# Patient Record
Sex: Male | Born: 1985 | Race: White | Hispanic: No | Marital: Single | Smoking: Never smoker
Health system: Southern US, Community
[De-identification: ages and names within clinical notes are randomized; demographics above are authoritative.]

## PROBLEM LIST (undated history)

## (undated) DIAGNOSIS — E119 Type 2 diabetes mellitus without complications: Secondary | ICD-10-CM

## (undated) DIAGNOSIS — I10 Essential (primary) hypertension: Secondary | ICD-10-CM

## (undated) HISTORY — PX: EYE SURGERY: SHX253

---
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
## 2014-08-18 ENCOUNTER — Encounter (HOSPITAL_BASED_OUTPATIENT_CLINIC_OR_DEPARTMENT_OTHER): Payer: Self-pay

## 2014-08-18 ENCOUNTER — Emergency Department (HOSPITAL_BASED_OUTPATIENT_CLINIC_OR_DEPARTMENT_OTHER): Payer: Worker's Compensation

## 2014-08-18 ENCOUNTER — Emergency Department (HOSPITAL_BASED_OUTPATIENT_CLINIC_OR_DEPARTMENT_OTHER)
Admission: EM | Admit: 2014-08-18 | Discharge: 2014-08-18 | Disposition: A | Discharge status: Home / Self Care | Payer: Worker's Compensation | Attending: Emergency Medicine | Admitting: Emergency Medicine

## 2014-08-18 DIAGNOSIS — Y9289 Other specified places as the place of occurrence of the external cause: Secondary | ICD-10-CM | POA: Insufficient documentation

## 2014-08-18 DIAGNOSIS — S99922A Unspecified injury of left foot, initial encounter: Secondary | ICD-10-CM | POA: Diagnosis present

## 2014-08-18 DIAGNOSIS — W228XXA Striking against or struck by other objects, initial encounter: Secondary | ICD-10-CM | POA: Insufficient documentation

## 2014-08-18 DIAGNOSIS — Z794 Long term (current) use of insulin: Secondary | ICD-10-CM | POA: Diagnosis not present

## 2014-08-18 DIAGNOSIS — I10 Essential (primary) hypertension: Secondary | ICD-10-CM | POA: Insufficient documentation

## 2014-08-18 DIAGNOSIS — IMO0002 Reserved for concepts with insufficient information to code with codable children: Secondary | ICD-10-CM

## 2014-08-18 DIAGNOSIS — Y9389 Activity, other specified: Secondary | ICD-10-CM | POA: Insufficient documentation

## 2014-08-18 DIAGNOSIS — Z23 Encounter for immunization: Secondary | ICD-10-CM | POA: Insufficient documentation

## 2014-08-18 DIAGNOSIS — E119 Type 2 diabetes mellitus without complications: Secondary | ICD-10-CM | POA: Diagnosis not present

## 2014-08-18 DIAGNOSIS — S91112A Laceration without foreign body of left great toe without damage to nail, initial encounter: Secondary | ICD-10-CM | POA: Diagnosis not present

## 2014-08-18 DIAGNOSIS — S92424A Nondisplaced fracture of distal phalanx of right great toe, initial encounter for closed fracture: Secondary | ICD-10-CM | POA: Insufficient documentation

## 2014-08-18 DIAGNOSIS — S92912B Unspecified fracture of left toe(s), initial encounter for open fracture: Secondary | ICD-10-CM

## 2014-08-18 DIAGNOSIS — Y998 Other external cause status: Secondary | ICD-10-CM | POA: Insufficient documentation

## 2014-08-18 HISTORY — DX: Essential (primary) hypertension: I10

## 2014-08-18 HISTORY — DX: Type 2 diabetes mellitus without complications: E11.9

## 2014-08-18 MED ORDER — SULFAMETHOXAZOLE-TRIMETHOPRIM 800-160 MG PO TABS: 1.0000 | ORAL_TABLET | Freq: Two times a day (BID) | ORAL | Status: AC

## 2014-08-18 MED ORDER — HYDROCODONE-ACETAMINOPHEN 5-325 MG PO TABS
1.0000 | ORAL_TABLET | ORAL | Status: DC | PRN
Start: 2014-08-18 — End: 2015-05-26

## 2014-08-18 MED ORDER — OXYCODONE-ACETAMINOPHEN 5-325 MG PO TABS
1.0000 | ORAL_TABLET | Freq: Once | ORAL | Status: AC
  Administered 2014-08-18: 1 via ORAL
  Filled 2014-08-18: qty 1

## 2014-08-18 MED ORDER — NAPROXEN 500 MG PO TABS: 500.0000 mg | ORAL_TABLET | Freq: Two times a day (BID) | ORAL | Status: DC

## 2014-08-18 MED ORDER — TETANUS-DIPHTH-ACELL PERTUSSIS 5-2.5-18.5 LF-MCG/0.5 IM SUSP
0.5000 mL | Freq: Once | INTRAMUSCULAR | Status: AC
  Administered 2014-08-18: 0.5 mL via INTRAMUSCULAR
  Filled 2014-08-18: qty 0.5

## 2014-08-18 MED ORDER — LIDOCAINE HCL (PF) 1 % IJ SOLN
5.0000 mL | Freq: Once | INTRAMUSCULAR | Status: AC
  Administered 2014-08-18: 5 mL via INTRADERMAL
  Filled 2014-08-18: qty 5

## 2014-08-18 MED ORDER — CEPHALEXIN 500 MG PO CAPS: 500.0000 mg | ORAL_CAPSULE | Freq: Four times a day (QID) | ORAL | Status: DC

## 2014-08-18 NOTE — ED Notes (Signed)
Dropped 120lb stone on left great toe today at [place] noted with nail nearly avulsed nail

## 2014-08-18 NOTE — ED Notes (Signed)
Pt was advised to contact supervisor for possible need for WC UDS

## 2014-08-18 NOTE — Discharge Instructions (Signed)
Please follow directions provided. Sure to follow-up with orthopedic doctor if your pain does not improve her symptoms worsen. Be sure to keep your wound clean and dry. Use warm soapy water soaks daily and change her dressing daily. Please take both antibiotics as directed to help prevent infection. You may take the naproxen twice a day to help with pain and inflammation. You may take the Vicodin as needed for pain not relieved by the naproxen. May use ice 20 minutes on 20 minutes off to help with discomfort. Don't hesitate to return for any new, worsening, or concerning symptoms.  GET HELP RIGHT AWAY IF:  Yellowish-white fluid (pus) comes from the wound.  Medicine does not lessen your pain.  There is a red streak going away from the wound.  You have a fever.

## 2014-08-18 NOTE — ED Provider Notes (Signed)
CSN: [id number]     Arrival date & time 08/18/14  1826 History   First MD Initiated Contact with Patient 08/18/14 2021     Chief Complaint  Patient presents with  . Toe Injury   (Consider location/radiation/quality/duration/timing/severity/associated sxs/prior Treatment) HPI  Barry Pittman is a 29 yo male presenting with report of toe injury.  He states appr 2 hours prior to arrival he was carrying landscaping stones and dropped on on his left great toe.  He estimates the stone weighs 120 pounds.  He rates his pain currently as 5/10.  He denies any numbness or tingling.  Past Medical History  Diagnosis Date  . Diabetes mellitus without complication   . Hypertension    History reviewed. No pertinent past surgical history. No family history on file. History  Substance Use Topics  . Smoking status: Never Smoker   . Smokeless tobacco: Not on file  . Alcohol Use: Yes    Review of Systems  Constitutional: Negative for fever and chills.  HENT: Negative for sore throat.   Eyes: Negative for visual disturbance.  Respiratory: Negative for cough and shortness of breath.   Cardiovascular: Negative for chest pain and leg swelling.  Gastrointestinal: Negative for nausea, vomiting and diarrhea.  Genitourinary: Negative for dysuria.  Musculoskeletal: Positive for myalgias and arthralgias.  Skin: Positive for color change and wound. Negative for rash.  Neurological: Negative for weakness, numbness and headaches.      Allergies  Review of patient's allergies indicates no known allergies.  Home Medications   Prior to Admission medications   Medication Sig Start Date End Date Taking? Authorizing Provider  Insulin Lispro, Human, (HUMALOG [place]) Inject into the skin.   Yes Historical Provider, MD  lisinopril (PRINIVIL,ZESTRIL) 20 MG tablet Take 20 mg by mouth daily.   Yes Historical Provider, MD   BP 169/100 mmHg  Pulse 102  Temp(Src) 98.1 F (36.7 C) (Oral)  Resp 18  Ht 5\' 11"   (1.803 m)  Wt 237 lb (107.502 kg)  BMI 33.07 kg/m2  SpO2 100% Physical Exam  Constitutional: He appears well-developed and well-nourished. No distress.  HENT:  Head: Normocephalic and atraumatic.  Mouth/Throat: Oropharynx is clear and moist.  Eyes: Conjunctivae are normal.  Neck: Neck supple.  Cardiovascular: Normal rate, regular rhythm and intact distal pulses.   Pulmonary/Chest: Effort normal and breath sounds normal. No respiratory distress. He has no wheezes. He has no rales. He exhibits no tenderness.  Abdominal: Soft. There is no tenderness.  Musculoskeletal: He exhibits tenderness.       Feet:  Bruising and tenderness to left great toe, partially avulsed nail, bleeding controlled, ROM reduced due to pain, N/V intact.   Lymphadenopathy:    He has no cervical adenopathy.  Neurological: He is alert.  Skin: Skin is warm and dry. No rash noted. He is not diaphoretic.  Psychiatric: He has a normal mood and affect.  Nursing note and vitals reviewed.   ED Course  Procedures (including critical care time) NERVE BLOCK Performed by: Britt Bottom Consent: Verbal consent obtained. Required items: required blood products, implants, devices, and special equipment available Time out: Immediately prior to procedure a "time out" was called to verify the correct patient, procedure, equipment, support staff and site/side marked as required.  Indication: nail removal Nerve block body site: left great toe  Preparation: Patient was prepped and draped in the usual sterile fashion. Needle gauge: 43 G Location technique: anatomical landmarks  Local anesthetic: 1 %Lidocaine without epi  Anesthetic  total: 5 ml  Outcome: pain improved Patient tolerance: Patient tolerated the procedure well with no immediate complications.   Labs Review Labs Reviewed - No data to display  Imaging Review Dg Toe Great Left  08/18/2014   CLINICAL DATA:  29 year old male with injury to the left great  toe today at work after being struck by a heavy stone.  EXAM: LEFT GREAT TOE  COMPARISON:  No priors.  FINDINGS: There appears to be a very subtle nondisplaced fracture of the distal phalanx of the great toe. No other acute displaced fracture, subluxation or dislocation is noted. Overlying soft tissues appear swollen.  IMPRESSION: 1. Subtle nondisplaced fracture of the distal phalanx of the left great toe.   Electronically Signed   By: Vinnie Langton M.D.   On: 08/18/2014 19:40     EKG Interpretation None      MDM   Final diagnoses:  Toe fracture, left, open, initial encounter  Nail avulsion   29 yo with nondisplaced left great toe fracture on xray and partial avulsed nail after blunt injury. Tdap given. His pain managed in ED. Digital block performed, nail removed and wound cleaned with soaking and pressure irrigation. Discussed wound care at home and use of post-op shoe.  Pt advised to follow up with orthopedics if symptoms persist. Conservative therapy recommended and discussed. Pt is well-appearing, in no acute distress and vital signs are stable.  They appear safe to be discharged.  Discharge include follow-up with their PCP.  Return precautions provided.  Patient will be dc home & is agreeable with above plan.   Filed Vitals:   08/18/14 1831 08/18/14 2158  BP: 169/100 145/76  Pulse: 102 87  Temp: 98.1 F (36.7 C)   TempSrc: Oral   Resp: 18 18  Height: 5\' 11"  (1.803 m)   Weight: 237 lb (107.502 kg)   SpO2: 100% 99%   Meds given in ED:  Medications  lidocaine (PF) (XYLOCAINE) 1 % injection 5 mL (5 mLs Intradermal Given 08/18/14 2046)  oxyCODONE-acetaminophen (PERCOCET/ROXICET) 5-325 MG per tablet 1 tablet (1 tablet Oral Given 08/18/14 2043)  Tdap (BOOSTRIX) injection 0.5 mL (0.5 mLs Intramuscular Given 08/18/14 2043)    Discharge Medication List as of 08/18/2014  9:50 PM    START taking these medications   Details  cephALEXin (KEFLEX) 500 MG capsule Take 1 capsule (500 mg  total) by mouth 4 (four) times daily., Starting 08/18/2014, Until Discontinued, Print    HYDROcodone-acetaminophen (NORCO/VICODIN) 5-325 MG per tablet Take 1 tablet by mouth every 4 (four) hours as needed., Starting 08/18/2014, Until Discontinued, Print    naproxen (NAPROSYN) 500 MG tablet Take 1 tablet (500 mg total) by mouth 2 (two) times daily., Starting 08/18/2014, Until Discontinued, Print    sulfamethoxazole-trimethoprim (BACTRIM DS,SEPTRA DS) 800-160 MG per tablet Take 1 tablet by mouth 2 (two) times daily., Starting 08/18/2014, Until Mon 08/25/14, Print           Britt Bottom, NP 08/19/14 1512  Malvin Johns, MD 08/19/14 [phone]

## 2015-05-26 ENCOUNTER — Inpatient Hospital Stay (HOSPITAL_COMMUNITY)
Admission: EM | Admit: 2015-05-26 | Discharge: 2015-05-31 | DRG: 637 | Disposition: A | Discharge status: Home / Self Care | Payer: 59 | Attending: Internal Medicine | Admitting: Internal Medicine

## 2015-05-26 ENCOUNTER — Encounter (HOSPITAL_COMMUNITY): Payer: Self-pay | Admitting: Emergency Medicine

## 2015-05-26 ENCOUNTER — Emergency Department (HOSPITAL_COMMUNITY): Payer: 59

## 2015-05-26 ENCOUNTER — Inpatient Hospital Stay (HOSPITAL_COMMUNITY): Payer: 59

## 2015-05-26 DIAGNOSIS — J9601 Acute respiratory failure with hypoxia: Secondary | ICD-10-CM | POA: Diagnosis not present

## 2015-05-26 DIAGNOSIS — D7589 Other specified diseases of blood and blood-forming organs: Secondary | ICD-10-CM | POA: Diagnosis present

## 2015-05-26 DIAGNOSIS — E1110 Type 2 diabetes mellitus with ketoacidosis without coma: Secondary | ICD-10-CM | POA: Diagnosis present

## 2015-05-26 DIAGNOSIS — I82409 Acute embolism and thrombosis of unspecified deep veins of unspecified lower extremity: Secondary | ICD-10-CM

## 2015-05-26 DIAGNOSIS — N179 Acute kidney failure, unspecified: Secondary | ICD-10-CM | POA: Diagnosis present

## 2015-05-26 DIAGNOSIS — M7989 Other specified soft tissue disorders: Secondary | ICD-10-CM | POA: Diagnosis not present

## 2015-05-26 DIAGNOSIS — I10 Essential (primary) hypertension: Secondary | ICD-10-CM | POA: Diagnosis present

## 2015-05-26 DIAGNOSIS — D75839 Thrombocytosis, unspecified: Secondary | ICD-10-CM | POA: Diagnosis present

## 2015-05-26 DIAGNOSIS — E1010 Type 1 diabetes mellitus with ketoacidosis without coma: Principal | ICD-10-CM

## 2015-05-26 DIAGNOSIS — E871 Hypo-osmolality and hyponatremia: Secondary | ICD-10-CM | POA: Diagnosis present

## 2015-05-26 DIAGNOSIS — Z9641 Presence of insulin pump (external) (internal): Secondary | ICD-10-CM | POA: Diagnosis present

## 2015-05-26 DIAGNOSIS — G43A1 Cyclical vomiting, intractable: Secondary | ICD-10-CM | POA: Diagnosis not present

## 2015-05-26 DIAGNOSIS — L03116 Cellulitis of left lower limb: Secondary | ICD-10-CM | POA: Diagnosis present

## 2015-05-26 DIAGNOSIS — M7120 Synovial cyst of popliteal space [Baker], unspecified knee: Secondary | ICD-10-CM

## 2015-05-26 DIAGNOSIS — E875 Hyperkalemia: Secondary | ICD-10-CM | POA: Diagnosis present

## 2015-05-26 DIAGNOSIS — Z794 Long term (current) use of insulin: Secondary | ICD-10-CM

## 2015-05-26 DIAGNOSIS — R079 Chest pain, unspecified: Secondary | ICD-10-CM

## 2015-05-26 DIAGNOSIS — E860 Dehydration: Secondary | ICD-10-CM | POA: Diagnosis present

## 2015-05-26 DIAGNOSIS — R112 Nausea with vomiting, unspecified: Secondary | ICD-10-CM | POA: Diagnosis present

## 2015-05-26 DIAGNOSIS — E0810 Diabetes mellitus due to underlying condition with ketoacidosis without coma: Secondary | ICD-10-CM | POA: Diagnosis not present

## 2015-05-26 DIAGNOSIS — D473 Essential (hemorrhagic) thrombocythemia: Secondary | ICD-10-CM | POA: Diagnosis present

## 2015-05-26 DIAGNOSIS — D72829 Elevated white blood cell count, unspecified: Secondary | ICD-10-CM | POA: Diagnosis present

## 2015-05-26 DIAGNOSIS — R1110 Vomiting, unspecified: Secondary | ICD-10-CM

## 2015-05-26 DIAGNOSIS — L0291 Cutaneous abscess, unspecified: Secondary | ICD-10-CM

## 2015-05-26 LAB — CBC WITH DIFFERENTIAL/PLATELET
BASOS ABS: 0 10*3/uL (ref 0.0–0.1)
Basophils Relative: 0 %
Eosinophils Absolute: 0 10*3/uL (ref 0.0–0.7)
Eosinophils Relative: 0 %
HEMATOCRIT: 54.2 % — AB (ref 39.0–52.0)
HEMOGLOBIN: 15.9 g/dL (ref 13.0–17.0)
Lymphocytes Relative: 10 %
Lymphs Abs: 2.8 10*3/uL (ref 0.7–4.0)
MCH: 28.6 pg (ref 26.0–34.0)
MCHC: 29.3 g/dL — AB (ref 30.0–36.0)
MCV: 97.5 fL (ref 78.0–100.0)
MONOS PCT: 8 %
Monocytes Absolute: 2.2 10*3/uL — ABNORMAL HIGH (ref 0.1–1.0)
NEUTROS ABS: 23 10*3/uL — AB (ref 1.7–7.7)
Neutrophils Relative %: 82 %
Platelets: 472 10*3/uL — ABNORMAL HIGH (ref 150–400)
RBC: 5.56 MIL/uL (ref 4.22–5.81)
RDW: 12.7 % (ref 11.5–15.5)
WBC: 28 10*3/uL — ABNORMAL HIGH (ref 4.0–10.5)

## 2015-05-26 LAB — URINE MICROSCOPIC-ADD ON

## 2015-05-26 LAB — ABO/RH: ABO/RH(D): O POS

## 2015-05-26 LAB — TYPE AND SCREEN
ABO/RH(D): O POS
Antibody Screen: NEGATIVE

## 2015-05-26 LAB — COMPREHENSIVE METABOLIC PANEL
ALBUMIN: 4.8 g/dL (ref 3.5–5.0)
ALT: 16 U/L — ABNORMAL LOW (ref 17–63)
AST: 26 U/L (ref 15–41)
Alkaline Phosphatase: 121 U/L (ref 38–126)
BUN: 38 mg/dL — ABNORMAL HIGH (ref 6–20)
CHLORIDE: 86 mmol/L — AB (ref 101–111)
Calcium: 9 mg/dL (ref 8.9–10.3)
Creatinine, Ser: 2.24 mg/dL — ABNORMAL HIGH (ref 0.61–1.24)
GFR calc Af Amer: 44 mL/min — ABNORMAL LOW (ref >60)
GFR, EST NON AFRICAN AMERICAN: 38 mL/min — AB (ref >60)
Glucose, Bld: 1149 mg/dL (ref 65–99)
POTASSIUM: 5.9 mmol/L — AB (ref 3.5–5.1)
Sodium: 123 mmol/L — ABNORMAL LOW (ref 135–145)
Total Bilirubin: 1.6 mg/dL — ABNORMAL HIGH (ref 0.3–1.2)
Total Protein: 8.4 g/dL — ABNORMAL HIGH (ref 6.5–8.1)

## 2015-05-26 LAB — BASIC METABOLIC PANEL
ANION GAP: 19 — AB (ref 5–15)
Anion gap: 15 (ref 5–15)
BUN: 37 mg/dL — AB (ref 6–20)
BUN: 33 mg/dL — ABNORMAL HIGH (ref 6–20)
BUN: 27 mg/dL — AB (ref 6–20)
CALCIUM: 8.6 mg/dL — AB (ref 8.9–10.3)
CHLORIDE: 104 mmol/L (ref 101–111)
CHLORIDE: 109 mmol/L (ref 101–111)
CO2: <7 mmol/L — ABNORMAL LOW (ref 22–32)
CO2: 7 mmol/L — ABNORMAL LOW (ref 22–32)
CO2: 13 mmol/L — ABNORMAL LOW (ref 22–32)
CREATININE: 1.85 mg/dL — AB (ref 0.61–1.24)
Calcium: 8.9 mg/dL (ref 8.9–10.3)
Calcium: 8.8 mg/dL — ABNORMAL LOW (ref 8.9–10.3)
Chloride: 108 mmol/L (ref 101–111)
Creatinine, Ser: 1.74 mg/dL — ABNORMAL HIGH (ref 0.61–1.24)
Creatinine, Ser: 1.44 mg/dL — ABNORMAL HIGH (ref 0.61–1.24)
GFR calc Af Amer: 55 mL/min — ABNORMAL LOW (ref >60)
GFR calc Af Amer: >60 mL/min (ref >60)
GFR calc non Af Amer: >60 mL/min (ref >60)
GFR, EST AFRICAN AMERICAN: 59 mL/min — AB (ref >60)
GFR, EST NON AFRICAN AMERICAN: 47 mL/min — AB (ref >60)
GFR, EST NON AFRICAN AMERICAN: 51 mL/min — AB (ref >60)
Glucose, Bld: 597 mg/dL (ref 65–99)
Glucose, Bld: 365 mg/dL — ABNORMAL HIGH (ref 65–99)
Glucose, Bld: 188 mg/dL — ABNORMAL HIGH (ref 65–99)
POTASSIUM: 5.8 mmol/L — AB (ref 3.5–5.1)
Potassium: 6.9 mmol/L (ref 3.5–5.1)
Potassium: 4.9 mmol/L (ref 3.5–5.1)
SODIUM: 134 mmol/L — AB (ref 135–145)
SODIUM: 137 mmol/L (ref 135–145)
Sodium: 134 mmol/L — ABNORMAL LOW (ref 135–145)

## 2015-05-26 LAB — BLOOD GAS, VENOUS
Acid-base deficit: 30.5 mmol/L — ABNORMAL HIGH (ref 0.0–2.0)
Bicarbonate: 5.3 mEq/L — ABNORMAL LOW (ref 20.0–24.0)
O2 SAT: 46.1 %
PATIENT TEMPERATURE: 98.6
PH VEN: 6.887 — AB (ref 7.250–7.300)
TCO2: 6.2 mmol/L (ref 0–100)
pCO2, Ven: 29.3 mmHg — ABNORMAL LOW (ref 45.0–50.0)

## 2015-05-26 LAB — GLUCOSE, CAPILLARY
Glucose-Capillary: 538 mg/dL — ABNORMAL HIGH (ref 65–99)
Glucose-Capillary: 456 mg/dL — ABNORMAL HIGH (ref 65–99)

## 2015-05-26 LAB — CBG MONITORING, ED
Glucose-Capillary: >600 mg/dL (ref 65–99)
Glucose-Capillary: >600 mg/dL (ref 65–99)
Glucose-Capillary: 557 mg/dL (ref 65–99)

## 2015-05-26 LAB — RAPID URINE DRUG SCREEN, HOSP PERFORMED
AMPHETAMINES: NOT DETECTED
BENZODIAZEPINES: NOT DETECTED
Barbiturates: NOT DETECTED
COCAINE: NOT DETECTED
OPIATES: NOT DETECTED
TETRAHYDROCANNABINOL: NOT DETECTED

## 2015-05-26 LAB — URINALYSIS, ROUTINE W REFLEX MICROSCOPIC
BILIRUBIN URINE: NEGATIVE
Glucose, UA: >1000 mg/dL — AB
Ketones, ur: >80 mg/dL — AB
Leukocytes, UA: NEGATIVE
NITRITE: NEGATIVE
PH: 5 (ref 5.0–8.0)
Protein, ur: 30 mg/dL — AB
Specific Gravity, Urine: 1.027 (ref 1.005–1.030)

## 2015-05-26 LAB — CK: CK TOTAL: 127 U/L (ref 49–397)

## 2015-05-26 LAB — LIPASE, BLOOD: Lipase: 16 U/L (ref 11–51)

## 2015-05-26 MED ORDER — SODIUM POLYSTYRENE SULFONATE 15 GM/60ML PO SUSP: 15.0000 g | Freq: Once | ORAL | Status: DC

## 2015-05-26 MED ORDER — PANTOPRAZOLE SODIUM 40 MG PO TBEC
40.0000 mg | DELAYED_RELEASE_TABLET | Freq: Every day | ORAL | Status: DC
  Administered 2015-05-27 – 2015-05-31 (×5): 40 mg via ORAL
  Filled 2015-05-26 (×5): qty 1

## 2015-05-26 MED ORDER — SODIUM CHLORIDE 0.9 % IV SOLN
INTRAVENOUS | Status: DC
  Administered 2015-05-26 – 2015-05-27 (×2): via INTRAVENOUS
  Administered 2015-05-29: 4.1 [IU]/h via INTRAVENOUS
  Filled 2015-05-26 (×3): qty 2.5

## 2015-05-26 MED ORDER — MORPHINE SULFATE (PF) 2 MG/ML IV SOLN
1.0000 mg | INTRAVENOUS | Status: DC | PRN
  Administered 2015-05-26 (×2): 1 mg via INTRAVENOUS
  Filled 2015-05-26 (×2): qty 1

## 2015-05-26 MED ORDER — DEXTROSE-NACL 5-0.45 % IV SOLN
INTRAVENOUS | Status: DC
  Administered 2015-05-26 – 2015-05-28 (×4): via INTRAVENOUS

## 2015-05-26 MED ORDER — SODIUM CHLORIDE 0.9 % IV SOLN
1000.0000 mL | Freq: Once | INTRAVENOUS | Status: AC
  Administered 2015-05-26: 1000 mL via INTRAVENOUS

## 2015-05-26 MED ORDER — MORPHINE SULFATE (PF) 2 MG/ML IV SOLN
2.0000 mg | INTRAVENOUS | Status: DC | PRN
  Administered 2015-05-26 – 2015-05-27 (×5): 2 mg via INTRAVENOUS
  Filled 2015-05-26 (×5): qty 1

## 2015-05-26 MED ORDER — SODIUM CHLORIDE 0.9 % IV SOLN
1000.0000 mL | INTRAVENOUS | Status: DC
  Administered 2015-05-26: 1000 mL via INTRAVENOUS

## 2015-05-26 MED ORDER — DEXTROSE-NACL 5-0.45 % IV SOLN: INTRAVENOUS | Status: DC

## 2015-05-26 MED ORDER — HEPARIN SODIUM (PORCINE) 5000 UNIT/ML IJ SOLN
5000.0000 [IU] | Freq: Three times a day (TID) | INTRAMUSCULAR | Status: DC
  Administered 2015-05-26 – 2015-05-31 (×14): 5000 [IU] via SUBCUTANEOUS
  Filled 2015-05-26 (×19): qty 1

## 2015-05-26 MED ORDER — SODIUM CHLORIDE 0.9 % IV SOLN
INTRAVENOUS | Status: DC
  Administered 2015-05-26: 14:00:00 via INTRAVENOUS

## 2015-05-26 MED ORDER — PANTOPRAZOLE SODIUM 40 MG IV SOLR
40.0000 mg | Freq: Two times a day (BID) | INTRAVENOUS | Status: AC
  Administered 2015-05-26: 40 mg via INTRAVENOUS
  Filled 2015-05-26: qty 40

## 2015-05-26 MED ORDER — SODIUM CHLORIDE 0.9 % IV BOLUS (SEPSIS)
1000.0000 mL | Freq: Once | INTRAVENOUS | Status: AC
  Administered 2015-05-26: 1000 mL via INTRAVENOUS

## 2015-05-26 MED ORDER — PROMETHAZINE HCL 25 MG/ML IJ SOLN
12.5000 mg | INTRAMUSCULAR | Status: DC | PRN
  Administered 2015-05-26: 12.5 mg via INTRAVENOUS
  Administered 2015-05-27 (×2): 25 mg via INTRAVENOUS
  Administered 2015-05-27: 12.5 mg via INTRAVENOUS
  Filled 2015-05-26 (×4): qty 1

## 2015-05-26 MED ORDER — SODIUM CHLORIDE 0.9 % IV SOLN
INTRAVENOUS | Status: AC
  Administered 2015-05-26: 13:00:00 via INTRAVENOUS

## 2015-05-26 MED ORDER — SODIUM CHLORIDE 0.9 % IV SOLN
INTRAVENOUS | Status: DC
  Administered 2015-05-26: 5.4 [IU]/h via INTRAVENOUS
  Administered 2015-05-26: 10.8 [IU]/h via INTRAVENOUS
  Filled 2015-05-26: qty 2.5

## 2015-05-26 MED ORDER — ONDANSETRON HCL 4 MG/2ML IJ SOLN
4.0000 mg | Freq: Four times a day (QID) | INTRAMUSCULAR | Status: DC | PRN
  Administered 2015-05-26 – 2015-05-28 (×4): 4 mg via INTRAVENOUS
  Filled 2015-05-26 (×4): qty 2

## 2015-05-26 NOTE — Progress Notes (Signed)
[facility] Progress Note Patient Name: DEEN KEHLER DOB: March 11, 1986 MRN: [id number]   Date of Service  05/26/2015  HPI/Events of Note  K 5.9-->6.9-->5.8 Nurse also noted Left lower leg swelling  eICU Interventions  1.continue insulin drip 2.obtain US lower ext-assess for DVT, abscess, cyst        Lauria Depoy 05/26/2015, 6:09 PM

## 2015-05-26 NOTE — ED Notes (Signed)
Primary nurse made aware of critical glucose of 1149.

## 2015-05-26 NOTE — H&P (Signed)
PULMONARY / CRITICAL CARE MEDICINE   Name: Barry Pittman MRN: [id number] DOB: [birth year]    ADMISSION DATE:  05/26/2015 CONSULTATION DATE:  05/26/2015  REFERRING MD:  EDP  CHIEF COMPLAINT:  DKA   HISTORY OF PRESENT ILLNESS:   Barry Pittman is a 30 y/o male with PMH of Type I DM (on insulin pump, average daily use ~ 80 units + coverage) and HTN who presented to [facility] with complaints of weakness and nausea/vomiting x 3 days.   Patient states that he began not feeling well on Saturday and then began having abdominal pain and vomiting on Sunday which has continued through day of admission. Patient reports episodes of vomiting and severe wretching overnight on Monday with dark "red" material coming up. He denies associated diarrhea or fevers and has not had any sick contacts recently. He also reports that his blood sugars have been elevated and getting progressively higher since Sunday despite reduced PO intake and continuing his normal insulin regimen per his pump.   Upon arrival to the ED he was tachycardic into the 130s, tachypneic and hypertensive into the 160s. His initial glucose was 1149, VBG with pH 6.887, pCO2 29.3 and bicarbonate 5.3, WBC 28.0, NA 123, K 5.9, and creatinine 2.24. UA with > 80 ketones and glucose > 1000. CXR with enlargement of cardiac silhouette but no acute infiltrate. Patient was started on DKA protocol and PCCM was consulted for admission.   PAST MEDICAL HISTORY :  He  has a past medical history of Diabetes mellitus without complication ([place]) and Hypertension.  PAST SURGICAL HISTORY: He  has no past surgical history on file.  No Known Allergies  No current facility-administered medications on file prior to encounter.   Current Outpatient Prescriptions on File Prior to Encounter  Medication Sig  . lisinopril (PRINIVIL,ZESTRIL) 20 MG tablet Take 20 mg by mouth daily.    FAMILY HISTORY:  His has no family status information on file.   SOCIAL HISTORY: He   reports that he has never smoked. He does not have any smokeless tobacco history on file. He reports that he drinks alcohol. He reports that he does not use illicit drugs.  REVIEW OF SYSTEMS:   Gen: Denies fever, chills, weight change, fatigue, night sweats HEENT: Denies blurred vision, double vision, hearing loss, tinnitus, sinus congestion, rhinorrhea, sore throat, neck stiffness, dysphagia PULM: Denies shortness of breath, cough, sputum production, hemoptysis, wheezing CV: Denies edema, orthopnea, paroxysmal nocturnal dyspnea, palpitations. Reports chest pain.  GI: Denies diarrhea, melena, constipation, change in bowel habits. Reports nausea, vomiting, abdominal pain, blood in vomit.  GU: Denies dysuria, hematuria, polyuria, oliguria, urethral discharge Endocrine: Denies hot or cold intolerance, polyuria, polyphagia or appetite change Derm: Denies rash, dry skin, scaling or peeling skin change Heme: Denies easy bruising, bleeding, bleeding gums Neuro: Denies headache, numbness, weakness, slurred speech, loss of memory or consciousness   SUBJECTIVE:  Reports chest pain and some abdominal pain   VITAL SIGNS: BP 161/73 mmHg  Pulse 113  Resp 34  SpO2 100%     INTAKE / OUTPUT:    PHYSICAL EXAMINATION: General:  Obese white male, lying in bed, in mild distress  Neuro:  A&Ox4, speech clear, non-focal.  HEENT: PERRL. Mucous membranes dry.  Cardiovascular:  Tachycardic, regular, no murmurs/rubs/gallops  Lungs:  Clear and equal bilaterally, tachypneic but no distress.  Abdomen:  Soft, some mild epigastric tenderness.  Musculoskeletal:  No gross deformities, no edema.  Skin:  Warm, dry. No rashes.   LABS:  BMET  Recent Labs Lab 05/26/15 0934  NA 123*  K 5.9*  CL 86*  CO2 <7*  BUN 38*  CREATININE 2.24*  GLUCOSE 1149*    Electrolytes  Recent Labs Lab 05/26/15 0934  CALCIUM 9.0    CBC  Recent Labs Lab 05/26/15 0934  WBC 28.0*  HGB 15.9  HCT 54.2*  PLT 472*     Coag's No results for input(s): APTT, INR in the last 168 hours.  Sepsis Markers No results for input(s): LATICACIDVEN, PROCALCITON, O2SATVEN in the last 168 hours.  ABG No results for input(s): PHART, PCO2ART, PO2ART in the last 168 hours.  Liver Enzymes  Recent Labs Lab 05/26/15 0934  AST 26  ALT 16*  ALKPHOS 121  BILITOT 1.6*  ALBUMIN 4.8    Cardiac Enzymes No results for input(s): TROPONINI, PROBNP in the last 168 hours.  Glucose  Recent Labs Lab 05/26/15 0924 05/26/15 1104 05/26/15 1156  GLUCAP >600* >600* >600*    Imaging Dg Chest Portable 1 View  05/26/2015  CLINICAL DATA:  Nausea and vomiting since Saturday intermittently, hypertension, diabetes mellitus EXAM: PORTABLE CHEST 1 VIEW COMPARISON:  Portable exam 0959 hours without priors for comparison. FINDINGS: Mild enlargement of cardiac silhouette. Mediastinal contours and pulmonary vascularity normal. Lungs clear. No pleural effusion or pneumothorax. Bones unremarkable. IMPRESSION: Enlargement of cardiac silhouette without acute infiltrate. Electronically Signed   By: Lavonia Dana M.D.   On: 05/26/2015 10:24     STUDIES:  CXR 2/7 >> Enlarged cardiac silhouette, no infiltrates  CULTURES: Blood 2/7 >>  ANTIBIOTICS: None   SIGNIFICANT EVENTS: 2/7 >> Admit to PCCM for DKA   LINES/TUBES: PIV   DISCUSSION: 30 y/o male with PMH of HTN, Type I DM (on insulin pump) who presented in severe DKA with glucose 1149 and venous pH 6.8. Unclear etiology - has had abdominal pain and vomiting x 3 days, ? Whether this was due to DKA or viral gastroenteritis (although less likely given no diarrhea) + new stress of recent delivery of twins.   ASSESSMENT / PLAN:  PULMONARY A: No acute issues  P:   O2 PRN to maintain Sats > 92% Encourage pulmonary hygiene    CARDIOVASCULAR A:  Hx HTN  Tachycardia - likely due to DKA/dehydration, improving with fluids  P:  Hold home lisinopril today with AKI, likely  restart tomorrow  Continue hydration, see below    RENAL A:   AKI - likely due to dehydration + Lisinopril  DKA - initial glucose 1149, unclear at this point if infectious prodrome  Hyponatremia  Hyperkalemia  P:   Hold Lisinopril for now  Give additional 2L NS bolus, then NS @ 125 cc/hr  Transition to D51/2NS with CBG <250 DKA protocol  Replace electrolytes as indicated  BMP q4  GASTROINTESTINAL A:   Nausea/Vomiting - unclear if this is due to DKA or viral gastroenteritis  Abdominal/Chest Pain  ?Hematemesis - small volume of dark red material in emesis overnight  P:   Zofran PRN   NPO - water only okay  If pain or further dark emesis persists after resolution of DKA consider abdominal imaging   HEMATOLOGIC A:   Leukocytosis - ? Infection vs hemoconcentration  DVT prophylaxis  P:  Trend CBC  Follow blood cultures  UA negative  SQ Heparin for DVT prophylaxis    INFECTIOUS A:   Leukocytosis - no clear source of infection, ? Whether vomiting related to gastroenteritis vs DKA  P:   Trend CBC  Follow  blood cultures  Monitor fever curve/WBC  Hold abx for now    ENDOCRINE A:   Type I DM - on insulin pump at home  DKA - ? If due to emotional stress, patient has 12 month old twins at home + recent move to [facility]  P:   DKA protocol  Aggressive hydration  BMET q4 Monitor for closure of anion gap  Family and patient requesting education from diabetes coordinator    NEUROLOGIC A:   Pain  P:   RASS goal: N/A 1 mg Morphine PRN for pain management    FAMILY  - Updates: Fiancee updated at bedside 2/7.   - Inter-disciplinary family meet or Palliative Care meeting due by: 06/02/15   Patient hemodynamically stable.  Admit to ICU, transition to [place] in am 0700.    Noe Gens, NP-C [facility] Pgr: [phone] or if no answer [phone] 05/26/2015, 12:16 PM

## 2015-05-26 NOTE — ED Provider Notes (Addendum)
CSN: [id number]     Arrival date & time 05/26/15  [id number] History   First MD Initiated Contact with Patient 05/26/15 0930     No chief complaint on file.    HPI Patient presents to the emergency department with complaints of generalized weakness as well as nausea and vomiting since yesterday.  He's a type I diabetic and has been so since the age of 47.  He's been compliant with his medications.  2 days ago he states he was not feeling well and then began having vomiting.  His blood sugars a been elevating over the past several days.  On arrival to the emergency department his blood sugars critical high on our blood glucose monitor.  He presents with a heart rate of 150 and tachypnea.  His wife reports that some dark material came up and he vomited.  No bright red blood.  No history of GI bleeds.  No history of excessive NSAID use.  He's been in his normal state of health recently.   Past Medical History  Diagnosis Date  . Diabetes mellitus without complication   . Hypertension    No past surgical history on file. No family history on file. Social History  Substance Use Topics  . Smoking status: Never Smoker   . Smokeless tobacco: Not on file  . Alcohol Use: Yes    Review of Systems  All other systems reviewed and are negative.     Allergies  Review of patient's allergies indicates no known allergies.  Home Medications   Prior to Admission medications   Medication Sig Start Date End Date Taking? Authorizing Provider  cephALEXin (KEFLEX) 500 MG capsule Take 1 capsule (500 mg total) by mouth 4 (four) times daily. 08/18/14   Britt Bottom, NP  HYDROcodone-acetaminophen (NORCO/VICODIN) 5-325 MG per tablet Take 1 tablet by mouth every 4 (four) hours as needed. 08/18/14   Britt Bottom, NP  Insulin Lispro, Human, (HUMALOG [place]) Inject into the skin.    Historical Provider, MD  lisinopril (PRINIVIL,ZESTRIL) 20 MG tablet Take 20 mg by mouth daily.    Historical Provider, MD  naproxen  (NAPROSYN) 500 MG tablet Take 1 tablet (500 mg total) by mouth 2 (two) times daily. 08/18/14   Britt Bottom, NP   BP 156/75 mmHg  Pulse 133  Resp 18  SpO2 97% Physical Exam  Constitutional: He is oriented to person, place, and time. He appears well-developed and well-nourished.  HENT:  Head: Normocephalic and atraumatic.  Eyes: EOM are normal.  Neck: Normal range of motion.  Cardiovascular: Regular rhythm and intact distal pulses.   Tachycardia  Pulmonary/Chest:  Tachypnea with clear lungs  Abdominal: Soft. He exhibits no distension. There is no tenderness.  Musculoskeletal: Normal range of motion.  Neurological: He is alert and oriented to person, place, and time.  Skin: Skin is warm and dry.  Psychiatric: He has a normal mood and affect. Judgment normal.  Nursing note and vitals reviewed.   ED Course  Procedures (including critical care time)  CRITICAL CARE Performed by: Hoy Morn Total critical care time: 32 minutes Critical care time was exclusive of separately billable procedures and treating other patients. Critical care was necessary to treat or prevent imminent or life-threatening deterioration. Critical care was time spent personally by me on the following activities: development of treatment plan with patient and/or surrogate as well as nursing, discussions with consultants, evaluation of patient's response to treatment, examination of patient, obtaining history from patient or surrogate, ordering and performing  treatments and interventions, ordering and review of laboratory studies, ordering and review of radiographic studies, pulse oximetry and re-evaluation of patient's condition.  Labs Review Labs Reviewed  CBC WITH DIFFERENTIAL/PLATELET - Abnormal; Notable for the following:    WBC 28.0 (*)    HCT 54.2 (*)    MCHC 29.3 (*)    Platelets 472 (*)    Neutro Abs 23.0 (*)    Monocytes Absolute 2.2 (*)    All other components within normal limits   COMPREHENSIVE METABOLIC PANEL - Abnormal; Notable for the following:    Sodium 123 (*)    Potassium 5.9 (*)    Chloride 86 (*)    CO2 <7 (*)    Glucose, Bld 1149 (*)    BUN 38 (*)    Creatinine, Ser 2.24 (*)    Total Protein 8.4 (*)    ALT 16 (*)    Total Bilirubin 1.6 (*)    GFR calc non Af Amer 38 (*)    GFR calc Af Amer 44 (*)    All other components within normal limits  BLOOD GAS, VENOUS - Abnormal; Notable for the following:    pH, Ven 6.887 (*)    pCO2, Ven 29.3 (*)    Bicarbonate 5.3 (*)    Acid-base deficit 30.5 (*)    All other components within normal limits  CBG MONITORING, ED - Abnormal; Notable for the following:    Glucose-Capillary >600 (*)    All other components within normal limits  CBG MONITORING, ED - Abnormal; Notable for the following:    Glucose-Capillary >600 (*)    All other components within normal limits  LIPASE, BLOOD  URINALYSIS, ROUTINE W REFLEX MICROSCOPIC (NOT AT [facility])  TYPE AND SCREEN  ABO/RH    Imaging Review Dg Chest Portable 1 View  05/26/2015  CLINICAL DATA:  Nausea and vomiting since Saturday intermittently, hypertension, diabetes mellitus EXAM: PORTABLE CHEST 1 VIEW COMPARISON:  Portable exam 0959 hours without priors for comparison. FINDINGS: Mild enlargement of cardiac silhouette. Mediastinal contours and pulmonary vascularity normal. Lungs clear. No pleural effusion or pneumothorax. Bones unremarkable. IMPRESSION: Enlargement of cardiac silhouette without acute infiltrate. Electronically Signed   By: Lavonia Dana M.D.   On: 05/26/2015 10:24   I have personally reviewed and evaluated these images and lab results as part of my medical decision-making.   EKG Interpretation   Date/Time:  Tuesday May 26 2015 09:29:27 EST Ventricular Rate:  125 PR Interval:  108 QRS Duration: 154 QT Interval:  341 QTC Calculation: 492 R Axis:   73 Text Interpretation:  Sinus tachycardia IVCD, consider atypical RBBB  Baseline wander in lead(s)  I II aVR V1 V2 V3 V4 V5 V6 No old tracing to  compare Confirmed by Anasofia Micallef  MD, Ifeanyi Mickelson ([id number]) on 05/26/2015 11:23:29 AM      MDM   Final diagnoses:  None    Patient presents with profound DKA with a pH of 6.8 and a bicarbonate less than 7.  IV resuscitation in the emergency department.  Started on insulin drip.  Patient with acute kidney injury.  Patient will be admitted to the intensive care unit this time.  Additional IV fluids now.  Patient and family updated.  No signs of coma.    Jola Schmidt, MD 05/26/15 [place], MD 05/26/15 [phone]

## 2015-05-26 NOTE — Progress Notes (Signed)
[place] Progress Note Patient Name: Barry Pittman DOB: 21-Mar-1986 MRN: [id number]   Date of Service  05/26/2015  HPI/Events of Note  Admitted for severe DKA, acidosis  K 5.9-->6.9  eICU Interventions  Continue Insulin infusion and re-assess BMP         [name] 05/26/2015, 3:23 PM

## 2015-05-26 NOTE — Progress Notes (Signed)
Called by nursing staff. Pt c/o non-radiating chest pain.  Had several episodes of vomiting. Had 1 dose of morphine that he had some relief with but then pain returned.   Plan Will cont PRN morphine Keep strict NPO CXR -->r/o pneumomediastinum May need to consider [place] ACNP-BC [place] Pager # [phone] OR # [phone] if no answer

## 2015-05-26 NOTE — ED Notes (Signed)
I ATTEMPTED TO COLLECT BLOOD CULTURES AND WAS UNSUCCESSFUL 

## 2015-05-26 NOTE — Progress Notes (Signed)
CRITICAL VALUE ALERT  Critical value received:  K+ 6.9, glucose 597  Date of notification: 05/26/2015  Time of notification:  15:20  Critical value read back: yes  Nurse who received alert:  Lacinda Axon RN  MD notified (1st page):  Dr. Raliegh Ip  ([place] doctor)  Time of first page:  15:21  MD notified (2nd page):n/a  Time of second page: n/a  Responding MD:  [place]ink doctor  Time MD responded:  15:22

## 2015-05-26 NOTE — Progress Notes (Signed)
Utilization Review completed.  Azad Calame RN CM  

## 2015-05-26 NOTE — ED Notes (Addendum)
CBG HIGH.Dr Venora Maples and nurse.

## 2015-05-26 NOTE — ED Notes (Signed)
Per pt, states vomiting on and off since Saturday-CBGs have been running high

## 2015-05-27 ENCOUNTER — Inpatient Hospital Stay (HOSPITAL_COMMUNITY): Payer: 59

## 2015-05-27 DIAGNOSIS — D72829 Elevated white blood cell count, unspecified: Secondary | ICD-10-CM | POA: Diagnosis present

## 2015-05-27 DIAGNOSIS — R112 Nausea with vomiting, unspecified: Secondary | ICD-10-CM | POA: Diagnosis present

## 2015-05-27 DIAGNOSIS — M7989 Other specified soft tissue disorders: Secondary | ICD-10-CM

## 2015-05-27 DIAGNOSIS — E871 Hypo-osmolality and hyponatremia: Secondary | ICD-10-CM | POA: Diagnosis present

## 2015-05-27 DIAGNOSIS — D75839 Thrombocytosis, unspecified: Secondary | ICD-10-CM | POA: Diagnosis present

## 2015-05-27 DIAGNOSIS — D473 Essential (hemorrhagic) thrombocythemia: Secondary | ICD-10-CM | POA: Diagnosis present

## 2015-05-27 DIAGNOSIS — E875 Hyperkalemia: Secondary | ICD-10-CM | POA: Diagnosis present

## 2015-05-27 DIAGNOSIS — J9601 Acute respiratory failure with hypoxia: Secondary | ICD-10-CM | POA: Diagnosis not present

## 2015-05-27 DIAGNOSIS — N179 Acute kidney failure, unspecified: Secondary | ICD-10-CM | POA: Diagnosis present

## 2015-05-27 LAB — GLUCOSE, CAPILLARY
GLUCOSE-CAPILLARY: 414 mg/dL — AB (ref 65–99)
GLUCOSE-CAPILLARY: 314 mg/dL — AB (ref 65–99)
GLUCOSE-CAPILLARY: 278 mg/dL — AB (ref 65–99)
GLUCOSE-CAPILLARY: 240 mg/dL — AB (ref 65–99)
GLUCOSE-CAPILLARY: 219 mg/dL — AB (ref 65–99)
GLUCOSE-CAPILLARY: 188 mg/dL — AB (ref 65–99)
GLUCOSE-CAPILLARY: 136 mg/dL — AB (ref 65–99)
GLUCOSE-CAPILLARY: 132 mg/dL — AB (ref 65–99)
GLUCOSE-CAPILLARY: 176 mg/dL — AB (ref 65–99)
GLUCOSE-CAPILLARY: 185 mg/dL — AB (ref 65–99)
GLUCOSE-CAPILLARY: 177 mg/dL — AB (ref 65–99)
GLUCOSE-CAPILLARY: 182 mg/dL — AB (ref 65–99)
GLUCOSE-CAPILLARY: 189 mg/dL — AB (ref 65–99)
GLUCOSE-CAPILLARY: 157 mg/dL — AB (ref 65–99)
GLUCOSE-CAPILLARY: 155 mg/dL — AB (ref 65–99)
GLUCOSE-CAPILLARY: 168 mg/dL — AB (ref 65–99)
GLUCOSE-CAPILLARY: 151 mg/dL — AB (ref 65–99)
Glucose-Capillary: 110 mg/dL — ABNORMAL HIGH (ref 65–99)
Glucose-Capillary: 111 mg/dL — ABNORMAL HIGH (ref 65–99)
Glucose-Capillary: 137 mg/dL — ABNORMAL HIGH (ref 65–99)
Glucose-Capillary: 171 mg/dL — ABNORMAL HIGH (ref 65–99)
Glucose-Capillary: 404 mg/dL — ABNORMAL HIGH (ref 65–99)
Glucose-Capillary: 175 mg/dL — ABNORMAL HIGH (ref 65–99)
Glucose-Capillary: 164 mg/dL — ABNORMAL HIGH (ref 65–99)
Glucose-Capillary: 175 mg/dL — ABNORMAL HIGH (ref 65–99)
Glucose-Capillary: 148 mg/dL — ABNORMAL HIGH (ref 65–99)
Glucose-Capillary: 166 mg/dL — ABNORMAL HIGH (ref 65–99)
Glucose-Capillary: 178 mg/dL — ABNORMAL HIGH (ref 65–99)
Glucose-Capillary: 147 mg/dL — ABNORMAL HIGH (ref 65–99)
Glucose-Capillary: 159 mg/dL — ABNORMAL HIGH (ref 65–99)

## 2015-05-27 LAB — BASIC METABOLIC PANEL
ANION GAP: 16 — AB (ref 5–15)
Anion gap: 13 (ref 5–15)
Anion gap: 15 (ref 5–15)
Anion gap: 13 (ref 5–15)
Anion gap: 14 (ref 5–15)
BUN: 25 mg/dL — AB (ref 6–20)
BUN: 22 mg/dL — AB (ref 6–20)
BUN: 20 mg/dL (ref 6–20)
BUN: 18 mg/dL (ref 6–20)
BUN: 19 mg/dL (ref 6–20)
CALCIUM: 9 mg/dL (ref 8.9–10.3)
CALCIUM: 9 mg/dL (ref 8.9–10.3)
CHLORIDE: 108 mmol/L (ref 101–111)
CHLORIDE: 109 mmol/L (ref 101–111)
CHLORIDE: 108 mmol/L (ref 101–111)
CO2: 15 mmol/L — AB (ref 22–32)
CO2: 14 mmol/L — AB (ref 22–32)
CO2: 14 mmol/L — AB (ref 22–32)
CO2: 16 mmol/L — AB (ref 22–32)
CO2: 16 mmol/L — AB (ref 22–32)
CREATININE: 1.24 mg/dL (ref 0.61–1.24)
CREATININE: 1.18 mg/dL (ref 0.61–1.24)
Calcium: 8.9 mg/dL (ref 8.9–10.3)
Calcium: 9 mg/dL (ref 8.9–10.3)
Calcium: 9.1 mg/dL (ref 8.9–10.3)
Chloride: 109 mmol/L (ref 101–111)
Chloride: 108 mmol/L (ref 101–111)
Creatinine, Ser: 1.19 mg/dL (ref 0.61–1.24)
Creatinine, Ser: 1.08 mg/dL (ref 0.61–1.24)
Creatinine, Ser: 1.11 mg/dL (ref 0.61–1.24)
GFR calc Af Amer: >60 mL/min (ref >60)
GFR calc Af Amer: >60 mL/min (ref >60)
GFR calc Af Amer: >60 mL/min (ref >60)
GFR calc non Af Amer: >60 mL/min (ref >60)
GFR calc non Af Amer: >60 mL/min (ref >60)
GFR calc non Af Amer: >60 mL/min (ref >60)
GFR calc non Af Amer: >60 mL/min (ref >60)
GLUCOSE: 193 mg/dL — AB (ref 65–99)
Glucose, Bld: 127 mg/dL — ABNORMAL HIGH (ref 65–99)
Glucose, Bld: 188 mg/dL — ABNORMAL HIGH (ref 65–99)
Glucose, Bld: 158 mg/dL — ABNORMAL HIGH (ref 65–99)
Glucose, Bld: 177 mg/dL — ABNORMAL HIGH (ref 65–99)
POTASSIUM: 4.4 mmol/L (ref 3.5–5.1)
POTASSIUM: 3.7 mmol/L (ref 3.5–5.1)
POTASSIUM: 3.7 mmol/L (ref 3.5–5.1)
Potassium: 5 mmol/L (ref 3.5–5.1)
Potassium: 4.4 mmol/L (ref 3.5–5.1)
SODIUM: 137 mmol/L (ref 135–145)
SODIUM: 137 mmol/L (ref 135–145)
SODIUM: 138 mmol/L (ref 135–145)
SODIUM: 138 mmol/L (ref 135–145)
Sodium: 138 mmol/L (ref 135–145)

## 2015-05-27 LAB — BLOOD GAS, ARTERIAL
ACID-BASE DEFICIT: 6.9 mmol/L — AB (ref 0.0–2.0)
Bicarbonate: 16.9 mEq/L — ABNORMAL LOW (ref 20.0–24.0)
DRAWN BY: 441261
FIO2: 0.21
O2 Saturation: 94.6 %
PCO2 ART: 30.4 mmHg — AB (ref 35.0–45.0)
PO2 ART: 63.7 mmHg — AB (ref 80.0–100.0)
Patient temperature: 98.6
TCO2: 14.8 mmol/L (ref 0–100)
pH, Arterial: 7.363 (ref 7.350–7.450)

## 2015-05-27 LAB — CBC
HCT: 45.9 % (ref 39.0–52.0)
Hemoglobin: 15.4 g/dL (ref 13.0–17.0)
MCH: 28.7 pg (ref 26.0–34.0)
MCHC: 33.6 g/dL (ref 30.0–36.0)
MCV: 85.5 fL (ref 78.0–100.0)
Platelets: 323 10*3/uL (ref 150–400)
RBC: 5.37 MIL/uL (ref 4.22–5.81)
RDW: 12.9 % (ref 11.5–15.5)
WBC: 20.7 10*3/uL — ABNORMAL HIGH (ref 4.0–10.5)

## 2015-05-27 MED ORDER — KETOROLAC TROMETHAMINE 30 MG/ML IJ SOLN
30.0000 mg | Freq: Four times a day (QID) | INTRAMUSCULAR | Status: DC | PRN
  Administered 2015-05-27 – 2015-05-30 (×4): 30 mg via INTRAVENOUS
  Filled 2015-05-27 (×4): qty 1

## 2015-05-27 MED ORDER — HYDRALAZINE HCL 20 MG/ML IJ SOLN
10.0000 mg | Freq: Once | INTRAMUSCULAR | Status: AC
  Administered 2015-05-27: 10 mg via INTRAVENOUS
  Filled 2015-05-27: qty 1

## 2015-05-27 MED ORDER — PROMETHAZINE HCL 25 MG/ML IJ SOLN: 12.5000 mg | Freq: Once | INTRAMUSCULAR | Status: AC

## 2015-05-27 MED ORDER — LIVING WELL WITH DIABETES BOOK
Freq: Once | Status: AC
  Administered 2015-05-27: 10:00:00
  Filled 2015-05-27: qty 1

## 2015-05-27 NOTE — Progress Notes (Signed)
Nutrition Brief Note  RD consulted for diabetes education with pt and pt's fiancee. Will follow-up for education once pt is out of ICU stepdown and more medically stable. Diabetes Coordinator to see as well.  Please contact RD for further nutrition issues.  Clayton Bibles, MS, RD, LDN Pager: [phone] After Hours Pager: [phone]

## 2015-05-27 NOTE — Progress Notes (Addendum)
Inpatient Diabetes Program Recommendations  AACE/ADA: New Consensus Statement on Inpatient Glycemic Control (2015)  Target Ranges:  Prepandial:   less than 140 mg/dL      Peak postprandial:   less than 180 mg/dL (1-2 hours)      Critically ill patients:  140 - 180 mg/dL   Review of Glycemic Control  Diabetes history: Type 1 since age 30 Outpatient Diabetes medications: Insulin pump Current orders for Inpatient glycemic control: DKA order set using IV glucostabilizer  Inpatient Diabetes Program Recommendations:    Consult received regarding request from patient's fiancee for education on DM. Once out of ICU stepdown, will order education via educational booklet Living well with Diabetes, videos [phone], Dietician consult.  Noted patient will need a "diabetatologist" follow/up. Assume this refers to an endocrinologist. No primary doctor listed, but will talk with patient regarding who has been his MD managing his pump. Will talk with both patient and fiancee when appropriate.  Patient can be referred to OP education at discharge for himself and fiancee to attend 1:1 with RN/RD at [facility]  Ad-Attempted visit with patient's fiancee. MD talking with her and mother regarding plan of care and present state of acidosis. Gave the fiancee the Living Well with Diabetes educational booklet and told her to review once she felt ready to read and absorb. Diabetes Coordinator will follow and follow-up with fiancee and patient.   Thank you Rosita Kea, RN, MSN, CDE  Diabetes Inpatient Program Office: [phone] Pager: [phone] 8:00 am to 5:00 pm

## 2015-05-27 NOTE — Progress Notes (Addendum)
Patient ID: HOSKIE DELOE, male   DOB: [birth year], 30 y.o.   MRN: [id number] [facility] PROGRESS NOTE  BRITT BORST [id number] DOB: August 09, 1985 DOA: 05/26/2015 PCP: No primary care provider on file.   Brief narrative:    Mr. Rufenacht is a 30 y/o male with PMH of Type I DM (on insulin pump, average daily use ~ 80 units + coverage) and HTN who presented to [facility] with complaints of weakness and nausea/vomiting x 3 days. He denies associated diarrhea or fevers and has not had any sick contacts recently. He also reports that his blood sugars have been elevated and getting progressively higher since Sunday despite reduced PO intake and continuing his normal insulin regimen per his pump.   Upon arrival to the ED he was tachycardic into the 130s, tachypneic and hypertensive into the 160s. His initial glucose was 1149, VBG with pH 6.887, pCO2 29.3 and bicarbonate 5.3, WBC 28.0, NA 123, K 5.9, and creatinine 2.24. UA with > 80 ketones and glucose > 1000. CXR with enlargement of cardiac silhouette but no acute infiltrate. Patient was started on DKA protocol and PCCM was consulted for admission  Assessment/Plan:    Acute hypoxic respiratory failure - not present on admission but noted after administration of several doses of morphine - respiratory rate as low as 7 and ABG with pO2 63 mmHg - instructed nurse to stop morphine and avoid narcotic medications - if pt somnolent allow narcan  - keep in SDU for close monitoring - no respiratory distress at this time but respiratory effort is very diminished with poor air movement bilaterally - ensure to keep on oxygen via [place] but may need to increase oxygen supply via NRB  Tachycardia  - likely due to DKA/dehydration, improving with fluids   AKI  - likely due to dehydration + Lisinopril  - hold ACEi and other nephrotoxins - repeat BMP - continue IVF as Cr is trending down  DKA  - initial glucose 1149, unclear at this point if  infectious prodrome  - improving but still with AG, continue insulin drip for now and adhere to protocol with IVF - dextrose if CBG < 250 and NS if CBG > 250   Hyponatremia  - appears to be pre renal from DKA - BMP in AM  Hyperkalemia  - from AKI, DKA, lisinopril - resolved   Thrombocytosis - reactive - CBC in AM  Nausea/Vomiting  - unclear if this is due to DKA or viral gastroenteritis  - improving - keep NPO - allow antiemetics as needed   Leukocytosis  - ? Infection vs hemoconcentration  - no clear source of infection, ? Whether vomiting related to gastroenteritis vs DKA  - CBC in AM  DVT prophylaxis - Heparin SQ  Code Status: Full.  Family Communication:  plan of care discussed with the patient and family at bedside  Disposition Plan: Home when DKA resolved, hopefully by 2/10  IV access:  Peripheral IV  Procedures and diagnostic studies:    [place] Extrem Low Left Ltd 05/26/2015  No focal soft tissue abnormality seen at the left calf. No evidence of abscess or other focal fluid collection.   Dg Chest Port 1 View 05/26/2015  . 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 There is no pleural fluid identified which if present would raise suspicion of Boerhaave syndrome. And we discussed that an upright  lateral view would be complementary, but may be an necessary as the patient is being admitted and will be monitored clinically. We discussed a follow-up chest CT if his condition worsens or he becomes unstable.  Dg Chest Portable 1 View 05/26/2015  Enlargement of cardiac silhouette without acute infiltrate.   Medical Consultants:  None   Other Consultants:  Diabetic educator   IAnti-Infectives:   None  [name], MD  [place] Pager [phone]  If 7PM-7AM, please contact night-coverage [email or web address] Password  [facility] 05/27/2015, 6:43 AM   LOS: 1 day   HPI/Subjective: No events overnight.   Objective: Filed Vitals:   05/27/15 0200 05/27/15 0300 05/27/15 0400 05/27/15 0500  BP: 137/64 126/69 144/65 124/84  Pulse: 104 107 108 102  Temp:      TempSrc:      Resp: 23 8 23 8   Height:      Weight:      SpO2: 97% 99% 98% 99%    Intake/Output Summary (Last 24 hours) at 05/27/15 0643 Last data filed at 05/27/15 0500  Gross per 24 hour  Intake 1815.33 ml  Output   1600 ml  Net 215.33 ml    Exam:   General:  Pt is somnolent but easy to awake   Cardiovascular: Regular rhythm, tachycardic, S1/S2, no murmurs, no rubs, no gallops  Respiratory: diminished breath sounds throughout and very poor inspiratory effort   Abdomen: Soft, non tender, non distended, bowel sounds present, no guarding  Extremities: No edema, pulses DP and PT palpable bilaterally  Data Reviewed: Basic Metabolic Panel:  Recent Labs Lab 05/26/15 0934 05/26/15 1407 05/26/15 1651 05/26/15 2147 05/27/15 0231  NA 123* 134* 134* 137 137  K 5.9* 6.9* 5.8* 4.9 5.0  CL 86* 104 108 109 109  CO2 <7* <7* 7* 13* 15*  GLUCOSE 1149* 597* 365* 188* 127*  BUN 38* 37* 33* 27* 25*  CREATININE 2.24* 1.85* 1.74* 1.44* 1.24  CALCIUM 9.0 8.9 8.6* 8.8* 9.0   Liver Function Tests:  Recent Labs Lab 05/26/15 0934  AST 26  ALT 16*  ALKPHOS 121  BILITOT 1.6*  PROT 8.4*  ALBUMIN 4.8    Recent Labs Lab 05/26/15 0934  LIPASE 16   CBC:  Recent Labs Lab 05/26/15 0934  WBC 28.0*  NEUTROABS 23.0*  HGB 15.9  HCT 54.2*  MCV 97.5  PLT 472*   Cardiac Enzymes:  Recent Labs Lab 05/26/15 1651  CKTOTAL 127   BNP: Invalid input(s): POCBNP CBG:  Recent Labs Lab 05/26/15 1104 05/26/15 1156 05/26/15 1313 05/26/15 1417 05/26/15 1503  GLUCAP >600* >600* 557* 538* 456*    No results found for this or any previous visit (from the past 240 hour(s)).   Scheduled Meds: . heparin  5,000 Units Subcutaneous 3 times per  day  . pantoprazole  40 mg Oral Q1200  . sodium polystyrene  15 g Oral Once   Continuous Infusions: . sodium chloride Stopped (05/26/15 2016)  . dextrose 5 % and 0.45% NaCl    . dextrose 5 % and 0.45% NaCl 75 mL/hr at 05/26/15 2002  . insulin (NOVOLIN-R) infusion 4.5 Units/hr (05/27/15 [id number])

## 2015-05-27 NOTE — Progress Notes (Signed)
VASCULAR LAB PRELIMINARY  PRELIMINARY  PRELIMINARY  PRELIMINARY   Left lower extremity has been completed.   Left:  No evidence of DVT, superficial thrombosis, or Baker's cyst.  Scan knot area in mid calf no abscess,no cystic very superificial.   Janifer Adie, RVT, RDMS 05/27/2015, 1:28 PM

## 2015-05-28 ENCOUNTER — Inpatient Hospital Stay (HOSPITAL_COMMUNITY): Payer: 59

## 2015-05-28 DIAGNOSIS — G43A1 Cyclical vomiting, intractable: Secondary | ICD-10-CM

## 2015-05-28 LAB — BASIC METABOLIC PANEL
ANION GAP: 14 (ref 5–15)
ANION GAP: 13 (ref 5–15)
ANION GAP: 13 (ref 5–15)
Anion gap: 11 (ref 5–15)
BUN: 20 mg/dL (ref 6–20)
BUN: 19 mg/dL (ref 6–20)
BUN: 19 mg/dL (ref 6–20)
BUN: 14 mg/dL (ref 6–20)
CALCIUM: 9.1 mg/dL (ref 8.9–10.3)
CALCIUM: 9.1 mg/dL (ref 8.9–10.3)
CHLORIDE: 109 mmol/L (ref 101–111)
CHLORIDE: 109 mmol/L (ref 101–111)
CHLORIDE: 108 mmol/L (ref 101–111)
CO2: 18 mmol/L — AB (ref 22–32)
CO2: 18 mmol/L — AB (ref 22–32)
CO2: 18 mmol/L — AB (ref 22–32)
CO2: 19 mmol/L — ABNORMAL LOW (ref 22–32)
CREATININE: 1.06 mg/dL (ref 0.61–1.24)
Calcium: 9.3 mg/dL (ref 8.9–10.3)
Calcium: 8.8 mg/dL — ABNORMAL LOW (ref 8.9–10.3)
Chloride: 109 mmol/L (ref 101–111)
Creatinine, Ser: 1.22 mg/dL (ref 0.61–1.24)
Creatinine, Ser: 0.93 mg/dL (ref 0.61–1.24)
Creatinine, Ser: 0.93 mg/dL (ref 0.61–1.24)
GFR calc Af Amer: >60 mL/min (ref >60)
GFR calc Af Amer: >60 mL/min (ref >60)
GFR calc Af Amer: >60 mL/min (ref >60)
GFR calc non Af Amer: >60 mL/min (ref >60)
GFR calc non Af Amer: >60 mL/min (ref >60)
GFR calc non Af Amer: >60 mL/min (ref >60)
GLUCOSE: 176 mg/dL — AB (ref 65–99)
GLUCOSE: 115 mg/dL — AB (ref 65–99)
GLUCOSE: 212 mg/dL — AB (ref 65–99)
Glucose, Bld: 183 mg/dL — ABNORMAL HIGH (ref 65–99)
POTASSIUM: 3.5 mmol/L (ref 3.5–5.1)
Potassium: 3.4 mmol/L — ABNORMAL LOW (ref 3.5–5.1)
Potassium: 3 mmol/L — ABNORMAL LOW (ref 3.5–5.1)
Potassium: 3.4 mmol/L — ABNORMAL LOW (ref 3.5–5.1)
Sodium: 141 mmol/L (ref 135–145)
Sodium: 140 mmol/L (ref 135–145)
Sodium: 139 mmol/L (ref 135–145)
Sodium: 139 mmol/L (ref 135–145)

## 2015-05-28 LAB — GLUCOSE, CAPILLARY
GLUCOSE-CAPILLARY: 167 mg/dL — AB (ref 65–99)
GLUCOSE-CAPILLARY: 114 mg/dL — AB (ref 65–99)
GLUCOSE-CAPILLARY: 127 mg/dL — AB (ref 65–99)
GLUCOSE-CAPILLARY: 185 mg/dL — AB (ref 65–99)
GLUCOSE-CAPILLARY: 163 mg/dL — AB (ref 65–99)
GLUCOSE-CAPILLARY: 225 mg/dL — AB (ref 65–99)
GLUCOSE-CAPILLARY: 147 mg/dL — AB (ref 65–99)
GLUCOSE-CAPILLARY: 158 mg/dL — AB (ref 65–99)
GLUCOSE-CAPILLARY: 150 mg/dL — AB (ref 65–99)
GLUCOSE-CAPILLARY: 141 mg/dL — AB (ref 65–99)
GLUCOSE-CAPILLARY: 157 mg/dL — AB (ref 65–99)
GLUCOSE-CAPILLARY: 126 mg/dL — AB (ref 65–99)
GLUCOSE-CAPILLARY: 128 mg/dL — AB (ref 65–99)
GLUCOSE-CAPILLARY: 169 mg/dL — AB (ref 65–99)
Glucose-Capillary: 144 mg/dL — ABNORMAL HIGH (ref 65–99)
Glucose-Capillary: 150 mg/dL — ABNORMAL HIGH (ref 65–99)
Glucose-Capillary: 154 mg/dL — ABNORMAL HIGH (ref 65–99)
Glucose-Capillary: 157 mg/dL — ABNORMAL HIGH (ref 65–99)
Glucose-Capillary: 163 mg/dL — ABNORMAL HIGH (ref 65–99)
Glucose-Capillary: 179 mg/dL — ABNORMAL HIGH (ref 65–99)
Glucose-Capillary: 181 mg/dL — ABNORMAL HIGH (ref 65–99)
Glucose-Capillary: 181 mg/dL — ABNORMAL HIGH (ref 65–99)
Glucose-Capillary: 229 mg/dL — ABNORMAL HIGH (ref 65–99)

## 2015-05-28 LAB — CBC
HEMATOCRIT: 44.7 % (ref 39.0–52.0)
HEMOGLOBIN: 15.2 g/dL (ref 13.0–17.0)
MCH: 28.6 pg (ref 26.0–34.0)
MCHC: 34 g/dL (ref 30.0–36.0)
MCV: 84 fL (ref 78.0–100.0)
Platelets: 291 10*3/uL (ref 150–400)
RBC: 5.32 MIL/uL (ref 4.22–5.81)
RDW: 12.9 % (ref 11.5–15.5)
WBC: 11.9 10*3/uL — ABNORMAL HIGH (ref 4.0–10.5)

## 2015-05-28 LAB — MRSA PCR SCREENING: MRSA by PCR: NEGATIVE

## 2015-05-28 MED ORDER — INSULIN PUMP
SUBCUTANEOUS | Status: DC
  Filled 2015-05-28: qty 1

## 2015-05-28 MED ORDER — POTASSIUM CHLORIDE 10 MEQ/100ML IV SOLN
10.0000 meq | INTRAVENOUS | Status: AC
  Administered 2015-05-28 (×2): 10 meq via INTRAVENOUS
  Filled 2015-05-28 (×2): qty 100

## 2015-05-28 MED ORDER — DEXTROSE-NACL 5-0.45 % IV SOLN
INTRAVENOUS | Status: DC
  Administered 2015-05-29: 04:00:00 via INTRAVENOUS

## 2015-05-28 MED ORDER — POTASSIUM CHLORIDE CRYS ER 20 MEQ PO TBCR
40.0000 meq | EXTENDED_RELEASE_TABLET | Freq: Once | ORAL | Status: AC
  Administered 2015-05-28: 40 meq via ORAL
  Filled 2015-05-28: qty 2

## 2015-05-28 MED ORDER — INSULIN PUMP: Freq: Three times a day (TID) | SUBCUTANEOUS | Status: DC

## 2015-05-28 MED ORDER — POTASSIUM CHLORIDE 10 MEQ/100ML IV SOLN
10.0000 meq | INTRAVENOUS | Status: AC
  Administered 2015-05-29 (×2): 10 meq via INTRAVENOUS
  Filled 2015-05-28 (×2): qty 100

## 2015-05-28 NOTE — Progress Notes (Signed)
Notified K Schorr NP new orders noted.

## 2015-05-28 NOTE — Progress Notes (Addendum)
Inpatient Diabetes Program Recommendations  AACE/ADA: New Consensus Statement on Inpatient Glycemic Control (2015)  Target Ranges:  Prepandial:   less than 140 mg/dL      Peak postprandial:   less than 180 mg/dL (1-2 hours)      Critically ill patients:  140 - 180 mg/dL   Results for TONG, PIECZYNSKI (MRN [id number]) as of 05/28/2015 12:00  Ref. Range 05/28/2015 05:05  Sodium Latest Ref Range: 135-145 mmol/L 140  Potassium Latest Ref Range: 3.5-5.1 mmol/L 3.4 (L)  Chloride Latest Ref Range: 101-111 mmol/L 109  CO2 Latest Ref Range: 22-32 mmol/L 18 (L)  BUN Latest Ref Range: 6-20 mg/dL 19  Creatinine Latest Ref Range: 0.61-1.24 mg/dL 0.93  Calcium Latest Ref Range: 8.9-10.3 mg/dL 9.1  EGFR (Non-African Amer.) Latest Ref Range: >60 mL/min >60  EGFR (African American) Latest Ref Range: >60 mL/min >60  Glucose Latest Ref Range: 65-99 mg/dL 115 (H)  Anion gap Latest Ref Range: 5-15  85   Admit with: DKA  History: Type 1 DM  Home DM Meds: Insulin Pump  Current Insulin Orders: IV Insulin drip    -Note BMET from 5am showed acidosis slowly improving.  CO2 up to 18 and Anion Gap down to 13 on 5am BMET.  -Next BMET due 12pm today.  MD-Patient will need all new insulin pump supplies if the plan is to restart his insulin pump after transitioning off IV insulin drip.  Per hospital Insulin Pump policy, patients are encouraged to call the 1-800# for the pump manufacturer to perform a safety check on their insulin pump and then should use all new supplies (new tubing, reservoir, insulin, insertion site, etc) when restarting their insulin pump.    Once patient ready to transition back to SQ insulin, Safest way to transition patient back to insulin pump would be to have patient resume insulin pump with all new supplies, continue IV insulin drip for one hour after insulin pump restarted, and then d/c IV insulin drip.  Spoke with patient's RN as well and let her know the above information.  Per RN,  Dr. Doyle Askew has given her orders to have pt resume insulin pump as soon as insulin arrives from home.      Spoke with patient and his fiancee around 12pm today.  Patient A&O x 4 and able to review his insulin pump settings with me.  Per patient and his fiancee, patient lost his insurance when they were living in [facility] and patient was going to a clinic for insulin pump adjustments.  Got all his insulin pump supplies in bulk through the clinic as well.  Per fiancee, patient would have two step appointment in [facility].  Would go for physical appointment and have CBG meter and pump downloaded and then would go back for second appointment for insulin pump adjustments.  Patient last saw MD in the DM clinic in [facility] back in August.  Needs to get established with Endocrinologist here in [place].  Encouraged patient and fiancee to pull up the [facility] web-site and search for local Endocrinologists here in [place].    Explained to patient and family how we will transition him off the IV insulin drip when his labs are improved.  Explained what we are looking for in his labs and encouraged patient to perform a safety check with the pump manufacturer as well.  Patient's fiancee stated she has all new pump supplies for him and is just waiting for a vial of Humalog.  Explained to patient that  we will need to wait until Dr. Doyle Askew gives the [place] for patient to resume his insulin pump.  Patient agreeable to this plan.  Explained to patient that he will need to use all new supplies once he is allowed to resume his insulin pump.  Pump Settings are as follows--  Basal Rates: 12am- 1.4 units/hr 4am- 2.85 units/hr 6am- 3.5 units/hr 9am- 4.0 units/hr 11am- 3.5 units/hr 10pm- 2.1 units/hr  Total Basal insulin per 24 hour period= 72.5 units basal insulin  Carbohydrate Ratio:  12am: 1 unit for every 10 grams carbohydrates 6pm: 1 unit for every 8 grams carbohydrates  Sensitivity Factor: 1 unit for every 30 mg/dl above  target CBG  Target CBG: 80-120 mg/dl     --Will follow patient during hospitalization--  Wyn Quaker RN, MSN, CDE Diabetes Coordinator Inpatient Glycemic Control Team Team Pager: [phone] (8a-5p)

## 2015-05-28 NOTE — Progress Notes (Signed)
Patient requested to wait until tomorrow morning to start his insulin pump. He is worried that it is too soon to come off of the drip and that he will get worse. His last CBG was 141. MD notified and agrees with patient to wait until tomorrow to start his insulin pump. Will continue to monitor.

## 2015-05-28 NOTE — Progress Notes (Signed)
Patient ID: Barry Pittman, male   DOB: 1986-03-09, 30 y.o.   MRN: [id number] [facility] PROGRESS NOTE  Barry Pittman [id number] DOB: 08-23-1985 DOA: 05/26/2015 PCP: No primary care provider on file.   Brief narrative:    Barry Pittman is a 30 y/o male with PMH of Type I DM (on insulin pump, average daily use ~ 80 units + coverage) and HTN who presented to [facility] with complaints of weakness and nausea/vomiting x 3 days. He denies associated diarrhea or fevers and has not had any sick contacts recently. He also reports that his blood sugars have been elevated and getting progressively higher since Sunday despite reduced PO intake and continuing his normal insulin regimen per his pump.   Upon arrival to the ED he was tachycardic into the 130s, tachypneic and hypertensive into the 160s. His initial glucose was 1149, VBG with pH 6.887, pCO2 29.3 and bicarbonate 5.3, WBC 28.0, NA 123, K 5.9, and creatinine 2.24. UA with > 80 ketones and glucose > 1000. CXR with enlargement of cardiac silhouette but no acute infiltrate. Patient was started on DKA protocol and PCCM was consulted for admission  Assessment/Plan:    Acute hypoxic respiratory failure - not present on admission but noted after administration of several doses of morphine - respiratory rate as low as 7 and ABG with pO2 63 mmHg - on 2/8 stopped morphine and pt now more alert and RR stable 12-16 bpm, stable oxygen saturation  - keep in SDU for close monitoring  Tachycardia  - likely due to DKA/dehydration, improving with fluids  - keep cardiac monitor on   AKI  - likely due to dehydration + Lisinopril  - hold ACEi and other nephrotoxins - continue IVF as Cr is trending down - BMP in AM  DKA  - initial glucose 1149, unclear at this point if infectious prodrome  - improving but still with mild AG, continue insulin drip for now and adhere to protocol with IVF - possible transition to insulin pump in AM  Hyponatremia   - appears to be pre renal from DKA - resolved with IVF  - BMP in AM  Hyperkalemia  - from AKI, DKA, lisinopril - now on low side and will need to supplement  - BMP in AM  Thrombocytosis - reactive, resolved  - CBC in AM  Nausea/Vomiting  - unclear if this is due to DKA or viral gastroenteritis  - improving - abd xray unremarkable, advance diet to clears  - allow antiemetics as needed   Leukocytosis  - ? Infection vs hemoconcentration  - no clear source of infection, ? Whether vomiting related to gastroenteritis vs DKA  - WBC is trending down  - CBC in AM  DVT prophylaxis - Heparin SQ  Code Status: Full.  Family Communication:  plan of care discussed with the patient and family at bedside  Disposition Plan: Home when DKA resolved, hopefully by 2/10  IV access:  Peripheral IV  Procedures and diagnostic studies:    [place] Extrem Low Left Ltd 05/26/2015  No focal soft tissue abnormality seen at the left calf. No evidence of abscess or other focal fluid collection.   Dg Chest Port 1 View 05/26/2015  . 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 There is no pleural fluid identified which if present would raise suspicion of Boerhaave syndrome. And we discussed that an  upright lateral view would be complementary, but may be an necessary as the patient is being admitted and will be monitored clinically. We discussed a follow-up chest CT if his condition worsens or he becomes unstable.  Dg Chest Portable 1 View 05/26/2015  Enlargement of cardiac silhouette without acute infiltrate.   Medical Consultants:  None   Other Consultants:  Diabetic educator   IAnti-Infectives:   None  [name], MD  [place] Pager [phone]  If 7PM-7AM, please contact night-coverage [email or web address] Password [email or web address] 05/28/2015, 5:45 PM   LOS: 2 days    HPI/Subjective: No events overnight.   Objective: Filed Vitals:   05/28/15 1300 05/28/15 1400 05/28/15 1500 05/28/15 1600  BP: 152/78 143/70 160/98 128/62  Pulse: 104 100 106 99  Temp:      TempSrc:      Resp: 0 8 11 23   Height:      Weight:      SpO2: 99% 99% 96% 96%    Intake/Output Summary (Last 24 hours) at 05/28/15 1745 Last data filed at 05/28/15 1600  Gross per 24 hour  Intake   2900 ml  Output   1600 ml  Net   1300 ml    Exam:   General:  Pt is somnolent but easy to awake   Cardiovascular: Regular rhythm, tachycardic, S1/S2, no murmurs, no rubs, no gallops  Respiratory: diminished breath sounds throughout and very poor inspiratory effort   Abdomen: Soft, non tender, non distended, bowel sounds present, no guarding  Extremities: No edema, pulses DP and PT palpable bilaterally  Data Reviewed: Basic Metabolic Panel:  Recent Labs Lab 05/27/15 1545 05/27/15 1947 05/28/15 0140 05/28/15 0505 05/28/15 1240  NA 138 138 141 140 139  K 3.7 3.7 3.5 3.4* 3.0*  CL 109 108 109 109 108  CO2 16* 16* 18* 18* 18*  GLUCOSE 158* 177* 176* 115* 212*  BUN 18 19 20 19 19   CREATININE 1.08 1.11 1.22 0.93 1.06  CALCIUM 9.0 9.1 9.3 9.1 9.1   Liver Function Tests:  Recent Labs Lab 05/26/15 0934  AST 26  ALT 16*  ALKPHOS 121  BILITOT 1.6*  PROT 8.4*  ALBUMIN 4.8    Recent Labs Lab 05/26/15 0934  LIPASE 16   CBC:  Recent Labs Lab 05/26/15 0934 05/27/15 0750 05/28/15 0505  WBC 28.0* 20.7* 11.9*  NEUTROABS 23.0*  --   --   HGB 15.9 15.4 15.2  HCT 54.2* 45.9 44.7  MCV 97.5 85.5 84.0  PLT 472* 323 291   Cardiac Enzymes:  Recent Labs Lab 05/26/15 1651  CKTOTAL 127   BNP: Invalid input(s): POCBNP CBG:  Recent Labs Lab 05/28/15 1306 05/28/15 1414 05/28/15 1521 05/28/15 1618 05/28/15 1719  GLUCAP 179* 147* 158* 150* 141*    Recent Results (from the past 240 hour(s))  Culture, blood (Routine X 2) w Reflex to ID Panel     Status: None  (Preliminary result)   Collection Time: 05/26/15  9:34 AM  Result Value Ref Range Status   Specimen Description BLOOD LEFT ANTECUBITAL  Final   Special Requests   Final    BOTTLES DRAWN AEROBIC AND ANAEROBIC 3CC ANA 4CC AER   Culture   Final    NO GROWTH 2 DAYS Performed at [facility]    Report Status PENDING  Incomplete  Culture, blood (Routine X 2) w Reflex to ID Panel     Status: None (Preliminary result)   Collection Time: 05/26/15  1:00 PM  Result Value Ref  Range Status   Specimen Description BLOOD RIGHT HAND  Final   Special Requests IN PEDIATRIC BOTTLE 2CC  Final   Culture   Final    NO GROWTH 2 DAYS Performed at [facility]    Report Status PENDING  Incomplete  MRSA PCR Screening     Status: None   Collection Time: 05/28/15  8:58 AM  Result Value Ref Range Status   MRSA by PCR NEGATIVE NEGATIVE Final    Comment:        The GeneXpert MRSA Assay (FDA approved for NASAL specimens only), is one component of a comprehensive MRSA colonization surveillance program. It is not intended to diagnose MRSA infection nor to guide or monitor treatment for MRSA infections.      Scheduled Meds: . heparin  5,000 Units Subcutaneous 3 times per day  . insulin pump   Subcutaneous 6 times per day  . pantoprazole  40 mg Oral Q1200  . potassium chloride  40 mEq Oral Once   Continuous Infusions: . sodium chloride Stopped (05/26/15 2016)  . insulin (NOVOLIN-R) infusion 5.8 Units/hr (05/28/15 1721)

## 2015-05-28 NOTE — Progress Notes (Signed)
Paged Lamar Blinks NP with bmet results.

## 2015-05-29 DIAGNOSIS — D473 Essential (hemorrhagic) thrombocythemia: Secondary | ICD-10-CM

## 2015-05-29 LAB — CBC
HCT: 42.2 % (ref 39.0–52.0)
Hemoglobin: 14.6 g/dL (ref 13.0–17.0)
MCH: 28.7 pg (ref 26.0–34.0)
MCHC: 34.6 g/dL (ref 30.0–36.0)
MCV: 83.1 fL (ref 78.0–100.0)
PLATELETS: 266 10*3/uL (ref 150–400)
RBC: 5.08 MIL/uL (ref 4.22–5.81)
RDW: 12.5 % (ref 11.5–15.5)
WBC: 8.7 10*3/uL (ref 4.0–10.5)

## 2015-05-29 LAB — GLUCOSE, CAPILLARY
GLUCOSE-CAPILLARY: 168 mg/dL — AB (ref 65–99)
GLUCOSE-CAPILLARY: 148 mg/dL — AB (ref 65–99)
GLUCOSE-CAPILLARY: 150 mg/dL — AB (ref 65–99)
GLUCOSE-CAPILLARY: 143 mg/dL — AB (ref 65–99)
GLUCOSE-CAPILLARY: 161 mg/dL — AB (ref 65–99)
GLUCOSE-CAPILLARY: 197 mg/dL — AB (ref 65–99)
GLUCOSE-CAPILLARY: 160 mg/dL — AB (ref 65–99)
GLUCOSE-CAPILLARY: 115 mg/dL — AB (ref 65–99)
GLUCOSE-CAPILLARY: 157 mg/dL — AB (ref 65–99)
Glucose-Capillary: 138 mg/dL — ABNORMAL HIGH (ref 65–99)
Glucose-Capillary: 160 mg/dL — ABNORMAL HIGH (ref 65–99)
Glucose-Capillary: 172 mg/dL — ABNORMAL HIGH (ref 65–99)
Glucose-Capillary: 175 mg/dL — ABNORMAL HIGH (ref 65–99)
Glucose-Capillary: 184 mg/dL — ABNORMAL HIGH (ref 65–99)
Glucose-Capillary: 186 mg/dL — ABNORMAL HIGH (ref 65–99)
Glucose-Capillary: 187 mg/dL — ABNORMAL HIGH (ref 65–99)

## 2015-05-29 LAB — BASIC METABOLIC PANEL
ANION GAP: 11 (ref 5–15)
BUN: 14 mg/dL (ref 6–20)
CALCIUM: 8.7 mg/dL — AB (ref 8.9–10.3)
CO2: 20 mmol/L — AB (ref 22–32)
CREATININE: 0.81 mg/dL (ref 0.61–1.24)
Chloride: 108 mmol/L (ref 101–111)
GFR calc Af Amer: >60 mL/min (ref >60)
GLUCOSE: 184 mg/dL — AB (ref 65–99)
Potassium: 3.3 mmol/L — ABNORMAL LOW (ref 3.5–5.1)
Sodium: 139 mmol/L (ref 135–145)

## 2015-05-29 LAB — MAGNESIUM: Magnesium: 1.9 mg/dL (ref 1.7–2.4)

## 2015-05-29 MED ORDER — POTASSIUM CHLORIDE CRYS ER 20 MEQ PO TBCR
40.0000 meq | EXTENDED_RELEASE_TABLET | Freq: Once | ORAL | Status: AC
  Administered 2015-05-29: 40 meq via ORAL
  Filled 2015-05-29: qty 2

## 2015-05-29 MED ORDER — INSULIN PUMP
Freq: Three times a day (TID) | SUBCUTANEOUS | Status: DC
  Administered 2015-05-29: 17:00:00 via SUBCUTANEOUS
  Administered 2015-05-29: 0.4 via SUBCUTANEOUS
  Administered 2015-05-30: 1.4 via SUBCUTANEOUS
  Administered 2015-05-30: 1.6 via SUBCUTANEOUS
  Administered 2015-05-30: 22:00:00 via SUBCUTANEOUS
  Administered 2015-05-30: 1.6 via SUBCUTANEOUS
  Administered 2015-05-31: 1.9 via SUBCUTANEOUS
  Administered 2015-05-31: 5.7 via SUBCUTANEOUS
  Administered 2015-05-31: 02:00:00 via SUBCUTANEOUS
  Filled 2015-05-29: qty 1

## 2015-05-29 MED ORDER — POTASSIUM CHLORIDE 10 MEQ/100ML IV SOLN
10.0000 meq | INTRAVENOUS | Status: AC
  Administered 2015-05-29 (×3): 10 meq via INTRAVENOUS
  Filled 2015-05-29 (×3): qty 100

## 2015-05-29 NOTE — Progress Notes (Signed)
Patient ID: Barry Pittman, male   DOB: [birth year], 30 y.o.   MRN: [id number] [facility] PROGRESS NOTE  Barry Pittman [id number] DOB: December 15, 1985 DOA: 05/26/2015 PCP: No primary care provider on file.   Brief narrative:    Barry Pittman is a 30 y/o male with PMH of Type I DM (on insulin pump, average daily use ~ 80 units + coverage) and HTN who presented to [facility] with complaints of weakness and nausea/vomiting x 3 days. He denies associated diarrhea or fevers and has not had any sick contacts recently. He also reports that his blood sugars have been elevated and getting progressively higher since Sunday despite reduced PO intake and continuing his normal insulin regimen per his pump.   Upon arrival to the ED he was tachycardic into the 130s, tachypneic and hypertensive into the 160s. His initial glucose was 1149, VBG with pH 6.887, pCO2 29.3 and bicarbonate 5.3, WBC 28.0, NA 123, K 5.9, and creatinine 2.24. UA with > 80 ketones and glucose > 1000. CXR with enlargement of cardiac silhouette but no acute infiltrate. Patient was started on DKA protocol and PCCM was consulted for admission  Assessment/Plan:    Acute hypoxic respiratory failure - not present on admission but noted after administration of several doses of morphine - respiratory rate as low as 7 and ABG with pO2 63 mmHg - on 2/8 stopped morphine and pt now more alert and RR stable 12-16 bpm, stable oxygen saturation  - transfer to medical unit   Tachycardia  - likely due to DKA/dehydration - responded to IVF, resolved, d/c tele   AKI  - likely due to dehydration + Lisinopril  - continue to hold ACEi and other nephrotoxins - continue IVF, Cr is now WNL  - BMP in AM  DKA  - initial glucose 1149, unclear at this point if infectious prodrome  - AG resolved, transition off insulin drip to insulin pump per home medical regimen    Hyponatremia  - appears to be pre renal from DKA - resolved with IVF  - BMP in  AM  Hyperkalemia  - from AKI, DKA, lisinopril - now on low side and will need to supplement this AM again, pt prefers oral route and I agree as he is able to tolerate PO - BMP in AM  Thrombocytosis - reactive, resolved  - CBC in AM  Nausea/Vomiting  - unclear if this is due to DKA or viral gastroenteritis  - improving - abd xray unremarkable, advance diet to soft  - allow antiemetics as needed   Leukocytosis  - ? Infection vs hemoconcentration  - no clear source of infection, ? Whether vomiting related to gastroenteritis vs DKA  - WBC is trending down and WNL this AM - CBC in AM  DVT prophylaxis - Heparin SQ  Code Status: Full.  Family Communication:  plan of care discussed with the patient and family at bedside  Disposition Plan: Home when DKA resolved, hopefully by 2/12, ok to transfer to medical unit   IV access:  Peripheral IV  Procedures and diagnostic studies:    [place] Extrem Low Left Ltd 05/26/2015  No focal soft tissue abnormality seen at the left calf. No evidence of abscess or other focal fluid collection.   Dg Chest Port 1 View 05/26/2015  . 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 There is no pleural fluid  identified which if present would raise suspicion of Boerhaave syndrome. And we discussed that an upright lateral view would be complementary, but may be an necessary as the patient is being admitted and will be monitored clinically. We discussed a follow-up chest CT if his condition worsens or he becomes unstable.  Dg Chest Portable 1 View 05/26/2015  Enlargement of cardiac silhouette without acute infiltrate.   Medical Consultants:  None   Other Consultants:  Diabetic educator   IAnti-Infectives:   None  [name], MD  [place] Pager [phone]  If 7PM-7AM, please contact  night-coverage [email or web address] Password [email or web address] 05/29/2015, 10:25 AM   LOS: 3 days   HPI/Subjective: No events overnight.   Objective: Filed Vitals:   05/29/15 0600 05/29/15 0700 05/29/15 0800 05/29/15 1000  BP: 143/79 159/98 141/93 141/77  Pulse: 91 88 99 87  Temp:  98.8 F (37.1 C)    TempSrc:      Resp: 17 25 9 5   Height:      Weight:      SpO2: 97% 96% 98% 93%    Intake/Output Summary (Last 24 hours) at 05/29/15 1025 Last data filed at 05/29/15 1000  Gross per 24 hour  Intake 2883.2 ml  Output   2125 ml  Net  758.2 ml    Exam:   General:  Pt is somnolent but easy to awake   Cardiovascular: Regular rhythm, tachycardic, S1/S2, no murmurs, no rubs, no gallops  Respiratory: diminished breath sounds throughout and very poor inspiratory effort   Abdomen: Soft, non tender, non distended, bowel sounds present but faint, no guarding  Extremities: No edema, pulses DP and PT palpable bilaterally  Data Reviewed: Basic Metabolic Panel:  Recent Labs Lab 05/28/15 0140 05/28/15 0505 05/28/15 1240 05/28/15 2159 05/29/15 0135  NA 141 140 139 139 139  K 3.5 3.4* 3.0* 3.4* 3.3*  CL 109 109 108 109 108  CO2 18* 18* 18* 19* 20*  GLUCOSE 176* 115* 212* 183* 184*  BUN 20 19 19 14 14   CREATININE 1.22 0.93 1.06 0.93 0.81  CALCIUM 9.3 9.1 9.1 8.8* 8.7*  MG  --   --   --   --  1.9   Liver Function Tests:  Recent Labs Lab 05/26/15 0934  AST 26  ALT 16*  ALKPHOS 121  BILITOT 1.6*  PROT 8.4*  ALBUMIN 4.8    Recent Labs Lab 05/26/15 0934  LIPASE 16   CBC:  Recent Labs Lab 05/26/15 0934 05/27/15 0750 05/28/15 0505 05/29/15 0135  WBC 28.0* 20.7* 11.9* 8.7  NEUTROABS 23.0*  --   --   --   HGB 15.9 15.4 15.2 14.6  HCT 54.2* 45.9 44.7 42.2  MCV 97.5 85.5 84.0 83.1  PLT 472* 323 291 266   Cardiac Enzymes:  Recent Labs Lab 05/26/15 1651  CKTOTAL 127   CBG:  Recent Labs Lab 05/29/15 0710 05/29/15 0712 05/29/15 0813 05/29/15 0915 05/29/15 1012   GLUCAP 187* 184* 186* 197* 160*    Recent Results (from the past 240 hour(s))  Culture, blood (Routine X 2) w Reflex to ID Panel     Status: None (Preliminary result)   Collection Time: 05/26/15  9:34 AM  Result Value Ref Range Status   Specimen Description BLOOD LEFT ANTECUBITAL  Final   Special Requests   Final    BOTTLES DRAWN AEROBIC AND ANAEROBIC 3CC ANA 4CC AER   Culture   Final    NO GROWTH 2 DAYS Performed at [facility]  Report Status PENDING  Incomplete  Culture, blood (Routine X 2) w Reflex to ID Panel     Status: None (Preliminary result)   Collection Time: 05/26/15  1:00 PM  Result Value Ref Range Status   Specimen Description BLOOD RIGHT HAND  Final   Special Requests IN PEDIATRIC BOTTLE 2CC  Final   Culture   Final    NO GROWTH 2 DAYS Performed at [facility]    Report Status PENDING  Incomplete  MRSA PCR Screening     Status: None   Collection Time: 05/28/15  8:58 AM  Result Value Ref Range Status   MRSA by PCR NEGATIVE NEGATIVE Final     Scheduled Meds: . heparin  5,000 Units Subcutaneous 3 times per day  . insulin pump   Subcutaneous 6 times per day  . pantoprazole  40 mg Oral Q1200   Continuous Infusions: . sodium chloride Stopped (05/26/15 2016)  . dextrose 5 % and 0.45% NaCl 75 mL/hr at 05/29/15 0348  . insulin (NOVOLIN-R) infusion 6.6 Units/hr (05/29/15 1016)

## 2015-05-29 NOTE — Progress Notes (Signed)
Date: May 29, 2015 Chart reviewed for concurrent status and case management needs. Will continue to follow patient for changes and needs: transferred oyut of icu to actue floor/transitioning from iv insulin to personal insulin pump Velva Harman, BSN, [place], [place]

## 2015-05-29 NOTE — Progress Notes (Signed)
Inpatient Diabetes Program Recommendations  AACE/ADA: New Consensus Statement on Inpatient Glycemic Control (2015)  Target Ranges:  Prepandial:   less than 140 mg/dL      Peak postprandial:   less than 180 mg/dL (1-2 hours)      Critically ill patients:  140 - 180 mg/dL    Results for CEDRIE, SKAGGS (MRN [id number]) as of 05/29/2015 11:58  Ref. Range 05/29/2015 07:12 05/29/2015 08:13 05/29/2015 09:15 05/29/2015 10:12 05/29/2015 11:53  Glucose-Capillary Latest Ref Range: 65-99 mg/dL 184 (H) 186 (H) 197 (H) 160 (H) 115 (H)    Admit with: DKA  History: Type 1 DM  Home DM Meds: Insulin Pump  Current Insulin Orders: Insulin Pump     -Patient restarted insulin pump this AM with all new supplies.  -Reviewed policy with patient and asked him to please notify his RN of all insulin boluses given by himself with his pump so the RN can document these insulin boluses.  -Reviewed charting responsibilities with RN on [place].    --Will follow patient during hospitalization--  Wyn Quaker RN, MSN, CDE Diabetes Coordinator Inpatient Glycemic Control Team Team Pager: [phone] (8a-5p)

## 2015-05-30 LAB — BASIC METABOLIC PANEL
Anion gap: 8 (ref 5–15)
BUN: 7 mg/dL (ref 6–20)
CHLORIDE: 100 mmol/L — AB (ref 101–111)
CO2: 26 mmol/L (ref 22–32)
Calcium: 8.1 mg/dL — ABNORMAL LOW (ref 8.9–10.3)
Creatinine, Ser: 0.96 mg/dL (ref 0.61–1.24)
GFR calc Af Amer: >60 mL/min (ref >60)
GLUCOSE: 209 mg/dL — AB (ref 65–99)
POTASSIUM: 3.3 mmol/L — AB (ref 3.5–5.1)
SODIUM: 134 mmol/L — AB (ref 135–145)

## 2015-05-30 LAB — MAGNESIUM: Magnesium: 1.7 mg/dL (ref 1.7–2.4)

## 2015-05-30 LAB — CBC
HCT: 41.1 % (ref 39.0–52.0)
Hemoglobin: 14.1 g/dL (ref 13.0–17.0)
MCH: 28.7 pg (ref 26.0–34.0)
MCHC: 34.3 g/dL (ref 30.0–36.0)
MCV: 83.7 fL (ref 78.0–100.0)
PLATELETS: 220 10*3/uL (ref 150–400)
RBC: 4.91 MIL/uL (ref 4.22–5.81)
RDW: 12.3 % (ref 11.5–15.5)
WBC: 6.5 10*3/uL (ref 4.0–10.5)

## 2015-05-30 LAB — GLUCOSE, CAPILLARY
GLUCOSE-CAPILLARY: 173 mg/dL — AB (ref 65–99)
GLUCOSE-CAPILLARY: 176 mg/dL — AB (ref 65–99)
GLUCOSE-CAPILLARY: 167 mg/dL — AB (ref 65–99)
Glucose-Capillary: 117 mg/dL — ABNORMAL HIGH (ref 65–99)
Glucose-Capillary: 124 mg/dL — ABNORMAL HIGH (ref 65–99)
Glucose-Capillary: 126 mg/dL — ABNORMAL HIGH (ref 65–99)

## 2015-05-30 MED ORDER — POTASSIUM CHLORIDE CRYS ER 20 MEQ PO TBCR
40.0000 meq | EXTENDED_RELEASE_TABLET | Freq: Once | ORAL | Status: AC
  Administered 2015-05-30: 40 meq via ORAL
  Filled 2015-05-30: qty 2

## 2015-05-30 NOTE — Progress Notes (Signed)
Patient ID: Barry Pittman, male   DOB: 12-27-1985, 30 y.o.   MRN: [id number] [facility] PROGRESS NOTE  BALAM MERCEDES [id number] DOB: 11/06/1985 DOA: 05/26/2015 PCP: No primary care provider on file.   Brief narrative:    Mr. Barry Pittman is a 30 y/o male with PMH of Type I DM (on insulin pump, average daily use ~ 80 units + coverage) and HTN who presented to [facility] with complaints of weakness and nausea/vomiting x 3 days. He denies associated diarrhea or fevers and has not had any sick contacts recently. He also reports that his blood sugars have been elevated and getting progressively higher since Sunday despite reduced PO intake and continuing his normal insulin regimen per his pump.   Upon arrival to the ED he was tachycardic into the 130s, tachypneic and hypertensive into the 160s. His initial glucose was 1149, VBG with pH 6.887, pCO2 29.3 and bicarbonate 5.3, WBC 28.0, NA 123, K 5.9, and creatinine 2.24. UA with > 80 ketones and glucose > 1000. CXR with enlargement of cardiac silhouette but no acute infiltrate. Patient was started on DKA protocol and PCCM was consulted for admission  Assessment/Plan:    Acute hypoxic respiratory failure - not present on admission but noted after administration of several doses of morphine - resolved   Tachycardia  - likely due to DKA/dehydration - responded to IVF, resolved  AKI  - likely due to dehydration + Lisinopril  - continue to hold ACEi and other nephrotoxins - Cr is now WNL, stop IVF - BMP in AM  DKA  - initial glucose 1149, unclear at this point if infectious prodrome  - AG resolved, transitioned to insulin pump per home medical regimen    Hyponatremia  - appears to be pre renal from DKA - BMP in AM  Hyperkalemia  - from AKI, DKA, lisinopril - still on low side and will need to supplement this AM again - BMP in AM  Thrombocytosis - reactive, resolved  - CBC in AM  Nausea/Vomiting  - unclear if this is due  to DKA or viral gastroenteritis  - improving, tolerating diet well  - allow antiemetics as needed   Leukocytosis  - ? Infection vs hemoconcentration  - no clear source of infection, ? Whether vomiting related to gastroenteritis vs DKA  - WBC is trending down and remains WNL this AM - CBC in AM  DVT prophylaxis - Heparin SQ  Code Status: Full.  Family Communication:  plan of care discussed with the patient and family at bedside  Disposition Plan: Home by 2/12  IV access:  Peripheral IV  Procedures and diagnostic studies:    [place] Extrem Low Left Ltd 05/26/2015  No focal soft tissue abnormality seen at the left calf. No evidence of abscess or other focal fluid collection.   Dg Chest Port 1 View 05/26/2015  . 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 There is no pleural fluid identified which if present would raise suspicion of Boerhaave syndrome. And we discussed that an upright lateral view would be complementary, but may be an necessary as the patient is being admitted and will be monitored clinically. We discussed a follow-up chest CT if his condition worsens or he becomes unstable.  Dg Chest Portable 1 View 05/26/2015  Enlargement of cardiac silhouette without acute infiltrate.   Medical Consultants:  None   Other Consultants:  Diabetic educator   IAnti-Infectives:   None  [name], MD  [facility] Pager [phone]  If 7PM-7AM, please contact night-coverage [email or web address] Password [email or web address] 05/30/2015, 1:48 PM   LOS: 4 days   HPI/Subjective: No events overnight.   Objective: Filed Vitals:   05/29/15 2010 05/30/15 0019 05/30/15 0528 05/30/15 0937  BP: 133/86 137/85 143/82 128/66  Pulse: 90 86 82 86  Temp: 99.1 F (37.3 C) 98.6 F (37 C) 98.7 F (37.1 C) 98.4 F (36.9 C)  TempSrc: Oral Oral Oral Oral  Resp: 20 18 16  16   Height:      Weight:      SpO2: 100% 100% 99% 99%    Intake/Output Summary (Last 24 hours) at 05/30/15 1348 Last data filed at 05/30/15 1025  Gross per 24 hour  Intake   1820 ml  Output    700 ml  Net   1120 ml    Exam:   General:  Pt is alert, sitting in chair   Cardiovascular: Regular rhythm, tachycardic, S1/S2, no murmurs, no rubs, no gallops  Respiratory: diminished breath sounds at bases   Abdomen: Soft, non tender, non distended, bowel sounds present but faint, no guarding  Extremities: No edema, pulses DP and PT palpable bilaterally  Data Reviewed: Basic Metabolic Panel:  Recent Labs Lab 05/28/15 0505 05/28/15 1240 05/28/15 2159 05/29/15 0135 05/30/15 0553  NA 140 139 139 139 134*  K 3.4* 3.0* 3.4* 3.3* 3.3*  CL 109 108 109 108 100*  CO2 18* 18* 19* 20* 26  GLUCOSE 115* 212* 183* 184* 209*  BUN 19 19 14 14 7   CREATININE 0.93 1.06 0.93 0.81 0.96  CALCIUM 9.1 9.1 8.8* 8.7* 8.1*  MG  --   --   --  1.9 1.7   Liver Function Tests:  Recent Labs Lab 05/26/15 0934  AST 26  ALT 16*  ALKPHOS 121  BILITOT 1.6*  PROT 8.4*  ALBUMIN 4.8    Recent Labs Lab 05/26/15 0934  LIPASE 16   CBC:  Recent Labs Lab 05/26/15 0934 05/27/15 0750 05/28/15 0505 05/29/15 0135 05/30/15 0553  WBC 28.0* 20.7* 11.9* 8.7 6.5  NEUTROABS 23.0*  --   --   --   --   HGB 15.9 15.4 15.2 14.6 14.1  HCT 54.2* 45.9 44.7 42.2 41.1  MCV 97.5 85.5 84.0 83.1 83.7  PLT 472* 323 291 266 220   Cardiac Enzymes:  Recent Labs Lab 05/26/15 1651  CKTOTAL 127   CBG:  Recent Labs Lab 05/29/15 2014 05/30/15 0019 05/30/15 0243 05/30/15 0735 05/30/15 1145  GLUCAP 138* 124* 173* 176* 167*    Recent Results (from the past 240 hour(s))  Culture, blood (Routine X 2) w Reflex to ID Panel     Status: None (Preliminary result)   Collection Time: 05/26/15  9:34 AM  Result Value Ref Range Status   Specimen Description BLOOD LEFT ANTECUBITAL  Final   Special Requests    Final    BOTTLES DRAWN AEROBIC AND ANAEROBIC 3CC ANA 4CC AER   Culture   Final    NO GROWTH 2 DAYS Performed at [facility]    Report Status PENDING  Incomplete  Culture, blood (Routine X 2) w Reflex to ID Panel     Status: None (Preliminary result)   Collection Time: 05/26/15  1:00 PM  Result Value Ref Range Status   Specimen Description BLOOD RIGHT HAND  Final   Special Requests IN PEDIATRIC BOTTLE [facility]  Final  Culture   Final    NO GROWTH 2 DAYS Performed at [facility]    Report Status PENDING  Incomplete  MRSA PCR Screening     Status: None   Collection Time: 05/28/15  8:58 AM  Result Value Ref Range Status   MRSA by PCR NEGATIVE NEGATIVE Final     Scheduled Meds: . heparin  5,000 Units Subcutaneous 3 times per day  . insulin pump   Subcutaneous TID AC, HS, 0200  . pantoprazole  40 mg Oral Q1200   Continuous Infusions: . sodium chloride 75 mL/hr at 05/29/15 1032

## 2015-05-30 NOTE — Progress Notes (Signed)
PT Cancellation Note  Patient Details Name: Barry Pittman MRN: [id number] DOB: November 06, 1985   Cancelled Treatment:    Reason Eval/Treat Not Completed: PT screened, no needs identified, will sign off   Weston Anna, MPT Pager: [phone]

## 2015-05-31 ENCOUNTER — Inpatient Hospital Stay (HOSPITAL_COMMUNITY): Payer: 59

## 2015-05-31 ENCOUNTER — Encounter (HOSPITAL_COMMUNITY): Payer: Self-pay | Admitting: Radiology

## 2015-05-31 DIAGNOSIS — D72829 Elevated white blood cell count, unspecified: Secondary | ICD-10-CM

## 2015-05-31 DIAGNOSIS — E875 Hyperkalemia: Secondary | ICD-10-CM

## 2015-05-31 DIAGNOSIS — E871 Hypo-osmolality and hyponatremia: Secondary | ICD-10-CM

## 2015-05-31 DIAGNOSIS — E0810 Diabetes mellitus due to underlying condition with ketoacidosis without coma: Secondary | ICD-10-CM

## 2015-05-31 DIAGNOSIS — J9601 Acute respiratory failure with hypoxia: Secondary | ICD-10-CM

## 2015-05-31 LAB — CULTURE, BLOOD (ROUTINE X 2)
CULTURE: NO GROWTH
Culture: NO GROWTH

## 2015-05-31 LAB — BASIC METABOLIC PANEL
Anion gap: 8 (ref 5–15)
BUN: 6 mg/dL (ref 6–20)
CALCIUM: 8.4 mg/dL — AB (ref 8.9–10.3)
CO2: 28 mmol/L (ref 22–32)
CREATININE: 0.87 mg/dL (ref 0.61–1.24)
Chloride: 98 mmol/L — ABNORMAL LOW (ref 101–111)
GFR calc Af Amer: >60 mL/min (ref >60)
GLUCOSE: 235 mg/dL — AB (ref 65–99)
POTASSIUM: 3.5 mmol/L (ref 3.5–5.1)
SODIUM: 134 mmol/L — AB (ref 135–145)

## 2015-05-31 LAB — GLUCOSE, CAPILLARY
GLUCOSE-CAPILLARY: 173 mg/dL — AB (ref 65–99)
GLUCOSE-CAPILLARY: 181 mg/dL — AB (ref 65–99)
Glucose-Capillary: 140 mg/dL — ABNORMAL HIGH (ref 65–99)

## 2015-05-31 LAB — CBC
HCT: 38.7 % — ABNORMAL LOW (ref 39.0–52.0)
HEMOGLOBIN: 13.1 g/dL (ref 13.0–17.0)
MCH: 27.8 pg (ref 26.0–34.0)
MCHC: 33.9 g/dL (ref 30.0–36.0)
MCV: 82.2 fL (ref 78.0–100.0)
PLATELETS: 203 10*3/uL (ref 150–400)
RBC: 4.71 MIL/uL (ref 4.22–5.81)
RDW: 12 % (ref 11.5–15.5)
WBC: 4.8 10*3/uL (ref 4.0–10.5)

## 2015-05-31 MED ORDER — DOXYCYCLINE HYCLATE 100 MG PO TABS: 100.0000 mg | ORAL_TABLET | Freq: Two times a day (BID) | ORAL | Status: DC

## 2015-05-31 MED ORDER — DOXYCYCLINE HYCLATE 100 MG PO TABS
100.0000 mg | ORAL_TABLET | Freq: Two times a day (BID) | ORAL | Status: DC
  Administered 2015-05-31: 100 mg via ORAL
  Filled 2015-05-31 (×2): qty 1

## 2015-05-31 MED ORDER — IOHEXOL 300 MG/ML  SOLN
100.0000 mL | Freq: Once | INTRAMUSCULAR | Status: AC | PRN
  Administered 2015-05-31: 100 mL via INTRAVENOUS

## 2015-05-31 NOTE — Discharge Instructions (Signed)
Diabetic Ketoacidosis °Diabetic ketoacidosis is a life-threatening complication of diabetes. If it is not treated, it can cause severe dehydration and organ damage and can lead to a coma or death. °CAUSES °This condition develops when there is not enough of the hormone insulin in the body. Insulin helps the body to break down sugar for energy. Without insulin, the body cannot break down sugar, so it breaks down fats instead. This leads to the production of acids that are called ketones. Ketones are poisonous at high levels. °This condition can be triggered by: °· Stress on the body that is brought on by an illness. °· Medicines that raise blood glucose levels. °· Not taking diabetes medicine. °SYMPTOMS °Symptoms of this condition include: °· Fatigue. °· Weight loss. °· Excessive thirst. °· Light-headedness. °· Fruity or sweet-smelling breath. °· Excessive urination. °· Vision changes. °· Confusion or irritability. °· Nausea. °· Vomiting. °· Rapid breathing. °· Abdominal pain. °· Feeling flushed. °DIAGNOSIS °This condition is diagnosed based on a medical history, a physical exam, and blood tests. You may also have a urine test that checks for ketones. °TREATMENT °This condition may be treated with: °· Fluid replacement. This may be done to correct dehydration. °· Insulin injections. These may be given through the skin or through an IV tube. °· Electrolyte replacement. Electrolytes, such as potassium and sodium, may be given in pill form or through an IV tube. °· Antibiotic medicines. These may be prescribed if your condition was caused by an infection. °HOME CARE INSTRUCTIONS °Eating and Drinking °· Drink enough fluids to keep your urine clear or pale yellow. °· If you cannot eat, alternate between drinking fluids with sugar (such as juice) and salty fluids (such as broth or bouillon). °· If you can eat, follow your usual diet and drink sugar-free liquids, such as water. °Other Instructions °· Take insulin as  directed by your health care provider. Do not skip insulin injections. Do not use expired insulin. °· If your blood sugar is over 240 mg/dL, monitor your urine ketones every 4-6 hours. °· If you were prescribed an antibiotic medicine, finish all of it even if you start to feel better. °· Rest and exercise only as directed by your health care provider. °· If you get sick, call your health care provider and begin treatment quickly. Your body often needs extra insulin to fight an illness. °· Check your blood glucose levels regularly. If your blood glucose is high, drink plenty of fluids. This helps to flush out ketones. °SEEK MEDICAL CARE IF: °· Your blood glucose level is too high or too low. °· You have ketones in your urine. °· You have a fever. °· You cannot eat. °· You cannot tolerate fluids. °· You have been vomiting for more than 2 hours. °· You continue to have symptoms of this condition. °· You develop new symptoms. °SEEK IMMEDIATE MEDICAL CARE IF: °· Your blood glucose levels continue to be high (elevated). °· Your monitor reads "high" even when you are taking insulin. °· You faint. °· You have chest pain. °· You have trouble breathing. °· You have a sudden, severe headache. °· You have sudden weakness in one arm or one leg. °· You have sudden trouble speaking or swallowing. °· You have vomiting or diarrhea that gets worse after 3 hours. °· You feel severely fatigued. °· You have trouble thinking. °· You have abdominal pain. °· You are severely dehydrated. Symptoms of severe dehydration include: °¨ Extreme thirst. °¨ Dry mouth. °¨ Blue lips. °¨   Cold hands and feet.  Rapid breathing.   This information is not intended to replace advice given to you by your health care provider. Make sure you discuss any questions you have with your health care provider.   Document Released: 04/01/2000 Document Revised: 08/19/2014 Document Reviewed: 03/12/2014 Elsevier Interactive Patient Education 2016 Elsevier  Inc. Cellulitis Cellulitis is an infection of the skin and the tissue beneath it. The infected area is usually red and tender. Cellulitis occurs most often in the arms and lower legs.  CAUSES  Cellulitis is caused by bacteria that enter the skin through cracks or cuts in the skin. The most common types of bacteria that cause cellulitis are staphylococci and streptococci. SIGNS AND SYMPTOMS   Redness and warmth.  Swelling.  Tenderness or pain.  Fever. DIAGNOSIS  Your health care provider can usually determine what is wrong based on a physical exam. Blood tests may also be done. TREATMENT  Treatment usually involves taking an antibiotic medicine. HOME CARE INSTRUCTIONS   Take your antibiotic medicine as directed by your health care provider. Finish the antibiotic even if you start to feel better.  Keep the infected arm or leg elevated to reduce swelling.  Apply a warm cloth to the affected area up to 4 times per day to relieve pain.  Take medicines only as directed by your health care provider.  Keep all follow-up visits as directed by your health care provider. SEEK MEDICAL CARE IF:   You notice red streaks coming from the infected area.  Your red area gets larger or turns dark in color.  Your bone or joint underneath the infected area becomes painful after the skin has healed.  Your infection returns in the same area or another area.  You notice a swollen bump in the infected area.  You develop new symptoms.  You have a fever. SEEK IMMEDIATE MEDICAL CARE IF:   You feel very sleepy.  You develop vomiting or diarrhea.  You have a general ill feeling (malaise) with muscle aches and pains.   This information is not intended to replace advice given to you by your health care provider. Make sure you discuss any questions you have with your health care provider.   Document Released: 01/12/2005 Document Revised: 12/24/2014 Document Reviewed: 06/20/2011 Elsevier  Interactive Patient Education [employer].

## 2015-05-31 NOTE — Progress Notes (Signed)
Pt left at this time with his fiance. Alert, oriented, and without c/o. Discharge instructions/prescription given/explained with pt verbalizing understanding. Pt left with cell phone and clothes.

## 2015-05-31 NOTE — Discharge Summary (Addendum)
Physician Discharge Summary  Barry Pittman LXB:[id number] DOB: [birth year] DOA: 05/26/2015  PCP: No primary care provider on file. Provided info on [facility] for pt to f/u on outpt basis   Admit date: 05/26/2015 Discharge date: 05/31/2015  Recommendations for Outpatient Follow-up:  No evidence of abscess on CT scan Continue doxycyline 100 mg twice a day for 10 days for cellulitis.  Discharge Diagnoses:  Principal Problem:   Intractable nausea and vomiting Active Problems:   DKA (diabetic ketoacidoses) (HCC)   Hyperkalemia   Hyponatremia   Acute kidney injury ([place])   Thrombocytosis (HCC)   Leukocytosis   Acute respiratory failure with hypoxia (HCC)    Discharge Condition: stable   Diet recommendation: as tolerated   History of present illness:  30 y/o male with PMH of Type I DM (on insulin pump, average daily use ~ 80 units + coverage) and HTN who presented to ED with weakness and nausea/vomiting x 3 days. No associated fevers.  On admission, he was tachycardic with HR in 130s, tachypneic and hypertensive with SBP in 160 range. His initial glucose was 1149, VBG with pH 6.887, pCO2 29.3 and bicarbonate 5.3, WBC 28.0, NA 123, K 5.9, and creatinine 2.24. UA with > 80 ketones and glucose > 1000. CXR with enlargement of cardiac silhouette but no acute infiltrate. Patient was started on DKA protocol and PCCM was consulted for admission  Hospital Course:   Assessment/Plan:    Acute hypoxic respiratory failure - Not present on admission but noted after administration of several doses of morphine - Respiratory status stable - Not hypoxic   AKI  - Likely due to dehydration, DKA - Resolved with fluids   DKA / Nausea and vomiting / Diabetes mellitus without complication with long term current use of insulin  - DKA criteria met on admission with CBG 1149, acidosis, bicarb 5.3, ketones on UA - DKA order set utilized on admission - Will resume insulin pump on discharge  - No A1c  on file   Hyponatremia  - Due to DKA - Sodium 134, better with fluids   Hyperkalemia  - From DKA - Improved with treatment of DKA  Thrombocytosis - Likely reactive, resolved  Leukocytosis / Left lower extremity cellulitis - No abscess on CT scan - Doxycycline for 10 days on discharge   DVT prophylaxis  - heparin SubQ   Code Status: Full.  Family Communication: plan of care discussed with the patient and his wife at bedside    Signed:  Leisa Lenz, MD  [facility] 05/31/2015, 2:25 PM  Pager #: [phone]  Time spent in minutes: more than 30 minutes   Discharge Exam: Filed Vitals:   05/30/15 2028 05/31/15 0556  BP: 132/80 129/74  Pulse: 87 84  Temp: 98.6 F (37 C) 98 F (36.7 C)  Resp: 17 16   Filed Vitals:   05/30/15 0937 05/30/15 1437 05/30/15 2028 05/31/15 0556  BP: 128/66 118/72 132/80 129/74  Pulse: 86 81 87 84  Temp: 98.4 F (36.9 C) 99 F (37.2 C) 98.6 F (37 C) 98 F (36.7 C)  TempSrc: Oral Oral Oral Oral  Resp: _0 Height:      Weight:      SpO2: 99% 99% 100% 98%    General: Pt is alert, follows commands appropriately, not in acute distress Cardiovascular: Regular rate and rhythm, S1/S2 +, no murmurs Respiratory: Clear to auscultation bilaterally, no wheezing, no crackles, no rhonchi Abdominal: Soft, non tender, non distended, bowel sounds +,  no guarding Extremities: no edema, no cyanosis, pulses palpable bilaterally DP and PT, small area of redness on his left calf Neuro: Grossly nonfocal  Discharge Instructions  Discharge Instructions    Call MD for:  difficulty breathing, headache or visual disturbances    Complete by:  As directed      Call MD for:  persistant dizziness or light-headedness    Complete by:  As directed      Call MD for:  persistant nausea and vomiting    Complete by:  As directed      Call MD for:  severe uncontrolled pain    Complete by:  As directed      Diet - low sodium heart healthy     Complete by:  As directed      Discharge instructions    Complete by:  As directed   No evidence of abscess on CT scan Continue doxycyline 100 mg twice a day for 10 days for cellulitis.     Increase activity slowly    Complete by:  As directed             Medication List    TAKE these medications        doxycycline 100 MG tablet  Commonly known as:  VIBRA-TABS  Take 1 tablet (100 mg total) by mouth every 12 (twelve) hours.     insulin lispro 100 UNIT/ML injection  Commonly known as:  HUMALOG  Inject 86 Units into the skin over 24 hr. Humalog Insulin Pump- 86 units per 24 hours.     Insulin Pump Accessories Misc  Inject 86 Units into the skin over 24 hr. Humalog Insulin- runs 86 units over 24 hours     lisinopril 20 MG tablet  Commonly known as:  PRINIVIL,ZESTRIL  Take 20 mg by mouth daily.          The results of significant diagnostics from this hospitalization (including imaging, microbiology, ancillary and laboratory) are listed below for reference.    Significant Diagnostic Studies: Ct Tibia Fibula Left W Contrast  05/31/2015  CLINICAL DATA:  Diabetic with posterior proximal calf pain and swelling for 1 week. No known injury. Evaluate for cellulitis versus abscess. EXAM: CT OF THE LOWER LEFT EXTREMITY WITH CONTRAST TECHNIQUE: Multidetector CT imaging of the left lower leg was performed according to the standard protocol following intravenous contrast administration. COMPARISON:  Ultrasound 05/26/2015. CONTRAST:  1106m OMNIPAQUE IOHEXOL 300 MG/ML  SOLN FINDINGS: There is skin thickening posteriorly in the proximal calf. There is underlying subcutaneous edema consistent with cellulitis. Posterolaterally in the proximal third of the calf, there is an ill-defined area of increased density within the subcutaneous fat, measuring up to 1.5 cm on axial image number 88. No drainable fluid collection, foreign body or soft tissue emphysema demonstrated. No vascular abnormalities are  identified. No intramuscular fluid collection or abnormal enhancement seen. The left tibia and fibula appear normal without evidence of osteomyelitis. IMPRESSION: 1. Skin thickening and subcutaneous edema within the left calf consistent with cellulitis. 2. Small ill-defined area of focally increased density in the subcutaneous fat consistent with focal inflammation/phlegmon. No drainable abscess. 3. No evidence of deep fluid collection or osteomyelitis. Electronically Signed   By: WRichardean SaleM.D.   On: 05/31/2015 14:04   UK[place]Extrem Low Left Ltd  05/26/2015  CLINICAL DATA:  Acute onset of left calf swelling. Initial encounter. EXAM: ULTRASOUND LEFT LOWER EXTREMITY LIMITED TECHNIQUE: Ultrasound examination of the lower extremity soft tissues was performed in  the area of clinical concern. COMPARISON:  None. FINDINGS: No focal soft tissue abnormalities are seen at the left calf. The visualized musculature is unremarkable in appearance. No focal fluid collections are seen. There is no evidence for abscess. IMPRESSION: No focal soft tissue abnormality seen at the left calf. No evidence of abscess or other focal fluid collection. Electronically Signed   By: Garald Balding M.D.   On: 05/26/2015 22:20   Dg Chest Port 1 View  05/26/2015  CLINICAL DATA:  30 year old male with acute diabetic ketoacidosis and extensive vomiting for 3 days. Acute chest pain. Query pneumomediastinum. Initial encounter. EXAM: PORTABLE CHEST 1 VIEW COMPARISON:  Portable chest 0959 hours today. FINDINGS: Portable AP semi upright view at 1515 hours. No pneumothorax, pleural effusion or confluent pulmonary opacity identified on this portable view. Normal cardiac size and mediastinal contours. Visualized tracheal air column is within normal limits. Slightly conspicuous hypodensity along the left heart border, but no tracking gas at the thoracic inlet to confirm pneumomediastinum. Paucity of bowel gas in the upper abdomen. No acute osseous  abnormality identified. IMPRESSION: I reviewed this study in person and discussed the following with the admission team on 05/26/2015 at 1325 hours. 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 There is no pleural fluid identified which if present would raise suspicion of Boerhaave syndrome. And we discussed that an upright lateral view would be complementary, but may be an necessary as the patient is being admitted and will be monitored clinically. We discussed a follow-up chest CT if his condition worsens or he becomes unstable. Electronically Signed   By: Genevie Ann M.D.   On: 05/26/2015 16:03   Dg Chest Portable 1 View  05/26/2015  CLINICAL DATA:  Nausea and vomiting since Saturday intermittently, hypertension, diabetes mellitus EXAM: PORTABLE CHEST 1 VIEW COMPARISON:  Portable exam 0959 hours without priors for comparison. FINDINGS: Mild enlargement of cardiac silhouette. Mediastinal contours and pulmonary vascularity normal. Lungs clear. No pleural effusion or pneumothorax. Bones unremarkable. IMPRESSION: Enlargement of cardiac silhouette without acute infiltrate. Electronically Signed   By: Lavonia Dana M.D.   On: 05/26/2015 10:24   Dg Abd 2 Views  05/28/2015  CLINICAL DATA:  Vomiting and weakness. EXAM: ABDOMEN - 2 VIEW COMPARISON:  None. FINDINGS: Bowel gas pattern unremarkable. No significant abnormal if air-fluid levels or findings of free intraperitoneal gas on the left-side-down lateral decubitus view. Gas and a small amount of formed stool in the colon. IMPRESSION: 1. Unremarkable bowel gas pattern. Electronically Signed   By: Van Clines M.D.   On: 05/28/2015 12:54    Microbiology: Culture, blood (Routine X 2) w Reflex to ID Panel     Status: None   Collection Time: 05/26/15  9:34 AM  Result Value Ref Range Status   Specimen Description BLOOD  LEFT ANTECUBITAL  Final   Special Requests   Final    BOTTLES DRAWN AEROBIC AND ANAEROBIC 3CC ANA 4CC AER   Culture   Final    NO GROWTH 5 DAYS Performed at [facility]    Report Status 05/31/2015 FINAL  Final  Culture, blood (Routine X 2) w Reflex to ID Panel     Status: None   Collection Time: 05/26/15  1:00 PM  Result Value Ref Range Status   Specimen Description BLOOD RIGHT HAND  Final   Special Requests IN PEDIATRIC BOTTLE [facility]  Final  Culture   Final    NO GROWTH 5 DAYS Performed at [facility]    Report Status 05/31/2015 FINAL  Final  MRSA PCR Screening     Status: None   Collection Time: 05/28/15  8:58 AM  Result Value Ref Range Status   MRSA by PCR NEGATIVE NEGATIVE Final     Labs: Basic Metabolic Panel:  Recent Labs Lab 05/28/15 1240 05/28/15 2159 05/29/15 0135 05/30/15 0553 05/31/15 0609  NA 139 139 139 134* 134*  K 3.0* 3.4* 3.3* 3.3* 3.5  CL 108 109 108 100* 98*  CO2 18* 19* 20* 26 28  GLUCOSE 212* 183* 184* 209* 235*  BUN _0 CREATININE 1.06 0.93 0.81 0.96 0.87  CALCIUM 9.1 8.8* 8.7* 8.1* 8.4*  MG  --   --  1.9 1.7  --    Liver Function Tests:  Recent Labs Lab 05/26/15 0934  AST 26  ALT 16*  ALKPHOS 121  BILITOT 1.6*  PROT 8.4*  ALBUMIN 4.8    Recent Labs Lab 05/26/15 0934  LIPASE 16   No results for input(s): AMMONIA in the last 168 hours. CBC:  Recent Labs Lab 05/26/15 0934 05/27/15 0750 05/28/15 0505 05/29/15 0135 05/30/15 0553 05/31/15 0609  WBC 28.0* 20.7* 11.9* 8.7 6.5 4.8  NEUTROABS 23.0*  --   --   --   --   --   HGB 15.9 15.4 15.2 14.6 14.1 13.1  HCT 54.2* 45.9 44.7 42.2 41.1 38.7*  MCV 97.5 85.5 84.0 83.1 83.7 82.2  PLT 472* 323 291 266 220 203   Cardiac Enzymes:  Recent Labs Lab 05/26/15 1651  CKTOTAL 127   BNP: BNP (last 3 results) No results for input(s): BNP in the last 8760 hours.  ProBNP (last 3 results) No results for input(s): PROBNP in the last 8760  hours.  CBG:  Recent Labs Lab 05/30/15 1619 05/30/15 2209 05/31/15 0301 05/31/15 0753 05/31/15 1246  GLUCAP 126* 117* 173* 181* 140*

## 2016-06-20 ENCOUNTER — Other Ambulatory Visit: Payer: Self-pay

## 2016-06-20 ENCOUNTER — Ambulatory Visit (INDEPENDENT_AMBULATORY_CARE_PROVIDER_SITE_OTHER): Payer: 59 | Admitting: Internal Medicine

## 2016-06-20 ENCOUNTER — Encounter: Payer: Self-pay | Admitting: Internal Medicine

## 2016-06-20 ENCOUNTER — Ambulatory Visit: Payer: Self-pay | Admitting: Internal Medicine

## 2016-06-20 VITALS — BP 150/88 | HR 110 | Ht 70.5 in | Wt 255.0 lb

## 2016-06-20 DIAGNOSIS — E10319 Type 1 diabetes mellitus with unspecified diabetic retinopathy without macular edema: Secondary | ICD-10-CM | POA: Diagnosis not present

## 2016-06-20 MED ORDER — INSULIN LISPRO 100 UNIT/ML ~~LOC~~ SOLN: 86.0000 [IU] | SUBCUTANEOUS | 2 refills | Status: DC

## 2016-06-20 MED ORDER — LISINOPRIL 20 MG PO TABS: 20.0000 mg | ORAL_TABLET | Freq: Every day | ORAL | 3 refills | Status: DC

## 2016-06-20 MED ORDER — GLUCAGON (RDNA) 1 MG IJ KIT: 1.0000 mg | PACK | Freq: Once | INTRAMUSCULAR | 12 refills | Status: DC | PRN

## 2016-06-20 MED ORDER — GLUCOSE BLOOD VI STRP: ORAL_STRIP | 11 refills | Status: DC

## 2016-06-20 MED ORDER — GLUCOSE BLOOD VI STRP: ORAL_STRIP | 5 refills | Status: DC

## 2016-06-20 NOTE — Progress Notes (Signed)
Patient ID: JOESIAH LONON, male   DOB: 09/18/1985, 31 y.o.   MRN: [id number]   HPI: DORIS MCGILVERY is a 31 y.o.-year-old male, self-referred, for management of DM1, dx'ed at 31 y/o (1999), uncontrolled, with  complications ([place])  He started on an insulin pump in 2000.  Last hemoglobin A1c was: 06/20/2016: Hba1c 10.6% No results found for: HGBA1C  Pt is on an insulin pump: Minimed Paradigm 722, without CGM, uses Humalog in the pump.  Pump settings: - basal rates: 12 am: 1.4 units/h 4 am: 2.85 6 am: 3.5 9 am: 4 11 am: 3.5 10 pm: 2.1 - ICR:   12 am: 10  6 pm: 8 - target: 80-120 - ISF: 30 - Insulin on Board: 6h - bolus wizard: on TDD from basal insulin: 87% TDD from bolus insulin: 13% - extended bolusing: not using - changes infusion site: seldom (!) - Meter: Freestyle Lite  Pt checks his sugars 2.9x a day and they are  - ave 170 +/- 45: - am: 100-264 - 2h after b'fast: 1 22-176 - before lunch: 92-161 - 2h after lunch: 145-174 - before dinner: 87, 120-184, 267 - 2h after dinner: n/c - bedtime: n/c - nighttime: n/c + lows. Lowest sugar was 35 - years ago, more recently, 1s; he has hypoglycemia awareness at 80. No previous hypoglycemia admission. He does not have a glucagon kit at home. Highest sugar was HI. + previous DKA admission 05/2015 - Flu and PNA.    Pt's meals are: - Breakfast: Mac Muffin - Lunch: Subway - Dinner: fast food - Snacks: yoghurt, cheese crackers, diet Mtn Dew  - no CKD, last BUN/creatinine:  Lab Results  Component Value Date   BUN 6 05/31/2015   BUN 7 05/30/2015   CREATININE 0.87 05/31/2015   CREATININE 0.96 05/30/2015  On Lisinopril - last set of lipids: No results found for: CHOL, HDL, LDLCALC, LDLDIRECT, TRIG, CHOLHDL - last eye exam was 06/17/2016. + DR. Has retina surgery coming up. Dr. Baird Cancer. - no numbness and tingling in his feet.  Last TSH: No results found for: TSH  Pt has FH of DM in M and F. Both died before 31  y/o.  He also has a history of HTN, HL.  ROS: Constitutional: + weight gain,+ fatigue, no subjective hyperthermia/hypothermia, + poor sleep Eyes: + blurry vision, no xerophthalmia ENT: no sore throat, no nodules palpated in throat, no dysphagia/odynophagia, no hoarseness Cardiovascular: no CP/SOB/palpitations/leg swelling Respiratory: no cough/SOB Gastrointestinal: no N/V/D/C Musculoskeletal: no muscle/joint aches Skin: no rashes Neurological: no tremors/numbness/tingling/dizziness Psychiatric: + depression/no anxiety  Past Medical History:  Diagnosis Date  . Diabetes mellitus without complication ([place])   . Hypertension    No past surgical history on file. Social History   Social History  . Marital status: married    Spouse name: N/A  . Number of children: 2   Occupational History  . sales   Social History Main Topics  . Smoking status: Never Smoker  . Smokeless tobacco: Never Used  . Alcohol use Yes  . Drug use: No   Current Outpatient Prescriptions on File Prior to Visit  Medication Sig Dispense Refill  . insulin lispro (HUMALOG) 100 UNIT/ML injection Inject 86 Units into the skin over 24 hr. Humalog Insulin Pump- 86 units per 24 hours.    . Insulin Pump Accessories MISC Inject 86 Units into the skin over 24 hr. Humalog Insulin- runs 86 units over 24 hours    . lisinopril (PRINIVIL,ZESTRIL) 20 MG tablet  Take 20 mg by mouth daily.     No current facility-administered medications on file prior to visit.    No Known Allergies   Family History  Problem Relation Age of Onset  . Diabetes Mother   . Heart disease Mother   . Diabetes Father   . Heart disease Father    PE: BP (!) 150/88 (BP Location: Left Arm, Patient Position: Sitting)   Pulse (!) 110   Ht 5' 10.5" (1.791 m)   Wt 255 lb (115.7 kg)   SpO2 98%   BMI 36.07 kg/m  Wt Readings from Last 3 Encounters:  06/20/16 255 lb (115.7 kg)  05/26/15 264 lb 15.9 oz (120.2 kg)  08/18/14 237 lb (107.5 kg)    Constitutional: overweight, in NAD, Anxious appearing Eyes: PERRLA, EOMI, no exophthalmos ENT: moist mucous membranes, no thyromegaly, no cervical lymphadenopathy Cardiovascular: RRR, No MRG Respiratory: CTA B Gastrointestinal: abdomen soft, NT, ND, BS+ Musculoskeletal: no deformities, strength intact in all 4 Skin: moist, warm, no rashes Neurological: + Mild tremor with outstretched hands, DTR normal in all 4  ASSESSMENT: 1. DM1, uncontrolled, with complications - DR  PLAN:  1. Patient with long-standing, uncontrolled DM1, on insulin pump therapy. His HbA1c today is quite high, at 10.6%. He would like to start gaining control of his diabetes and would like to establish care with me. He is aware that he is not doing everything that he needs to do to better manage the pump: He is not checking sugars consistently and not bolusing with every meal, but he is determined to change this. - He has an unusual pump regimen, with very high basal rates during the day, to substitute for his lack of bolusing. I explained that this is conducive to low blood sugars and not ideal. We will decrease his basal rates today and will also decrease his insulin to carb ratios to allow him to bolus more for meals. We'll also change his target to a more uniform 100-100 CBG. Additionally, will change his active insulin time from 6 hours (?) to 4 hours. - I strongly advised him to check sugars before the meal, enter them in the pump, also enter the number of carbs that he is planning to eat, and start the boluses approximately 15 minutes before every meal. He agrees to start this. - He is not changing in the pump site frequently enough >> strongly advised him to change it every 3-max 4 days - He is interested in changing his pump to the new was Medtronic 670 G and will call his insurance to see if this is covered. I advised him that he most likely needs to run out of warranty before being able to change to this. He is a  his third year on the current pump. - We also discussed about his diet and his determined to also improve this. - We discussed about changes to his insulin regimen, as follows:  Patient Instructions  Please check with your insurance if they cover the Medtronic 670G pump + CGM.  Please change the pump settings as follows: - basal rates: 12 am: 1.4 units/h >> 1.6 4 am: 2.85 >> 2.0 6 am: 3.5 >> 2.2 9 am: 4 >> 2.2 11 am: 3.5 >> 2.2 10 pm: 2.1 - ICR:   12 am: 10 >> 7  6 pm: 8 >> 6 - target: 80-120 >> 100-100 - ISF: 30  - Insulin on Board: 6h >> 4h  Please check sugars and enter them in  the pump, enter the carbs in the pump and bolus Humalog 10-15 min before every meal.  Check sugars before each meal and at bedtime.  Change the pump site every Wed and Sun.  Please return in 1.5 months.  - Strongly advised him to start checking sugars at different times of the day - check at least 4 times a day, rotating checks - given foot care handout and explained the principles  - given instructions for hypoglycemia management "15-15 rule"  - advised for yearly eye exams - sent glucagon kit Rx to pharmacy - advised to get ketone strips - advised to always have Glu tablets with him - advised for a Med-alert bracelet mentioning "type 1 diabetes mellitus". - given instruction Re: exercising and driving in DM1 (pt instructions) - no signs of other autoimmune disorders, but will need a TSH at next visit, along with lipid panel and ACR - Return to clinic in 1.5 mo with sugar log   - time spent with the patient: 1 hour, of which >50% was spent in obtaining information about his disease, reviewing previous labs, and DM treatments (pump settings and pump downloads), counseling pt about his condition (please see the discussed topics above), and developing a plan to prevent further hypoglycemia and hyperglycemia. We also discussed about proper diet.   Philemon Kingdom, MD PhD [facility]

## 2016-06-20 NOTE — Patient Instructions (Addendum)
Please check with your insurance if they cover the Medtronic 670G pump + CGM.  Please change the pump settings as follows: - basal rates: 12 am: 1.4 units/h >> 1.6 4 am: 2.85 >> 2.0 6 am: 3.5 >> 2.2 9 am: 4 >> 2.2 11 am: 3.5 >> 2.2 10 pm: 2.1 - ICR:   12 am: 10 >> 7  6 pm: 8 >> 6 - target: 80-120 >> 100-100 - ISF: 30  - Insulin on Board: 6h >> 4h  Please check sugars and enter them in the pump, enter the carbs in the pump and bolus Humalog 10-15 min before every meal.  Check sugars before each meal and at bedtime.  Change the pump site every Wed and Sun.  Please return in 1.5 months.

## 2016-06-21 ENCOUNTER — Other Ambulatory Visit: Payer: Self-pay

## 2016-06-21 ENCOUNTER — Encounter: Payer: Self-pay | Admitting: Internal Medicine

## 2016-06-21 DIAGNOSIS — E10319 Type 1 diabetes mellitus with unspecified diabetic retinopathy without macular edema: Secondary | ICD-10-CM | POA: Insufficient documentation

## 2016-06-21 LAB — POCT GLYCOSYLATED HEMOGLOBIN (HGB A1C): Hemoglobin A1C: 10.6

## 2016-06-21 MED ORDER — INSULIN ASPART 100 UNIT/ML ~~LOC~~ SOLN: SUBCUTANEOUS | 11 refills | Status: DC

## 2016-06-21 NOTE — Addendum Note (Signed)
Addended by: Caprice Beaver T on: 06/21/2016 02:45 PM   Modules accepted: Orders

## 2016-07-26 LAB — HM DIABETES EYE EXAM

## 2016-08-26 ENCOUNTER — Ambulatory Visit: Payer: Self-pay | Admitting: Internal Medicine

## 2016-09-21 ENCOUNTER — Emergency Department (HOSPITAL_COMMUNITY): Payer: 59

## 2016-09-21 ENCOUNTER — Inpatient Hospital Stay (HOSPITAL_COMMUNITY)
Admission: EM | Admit: 2016-09-21 | Discharge: 2016-09-27 | DRG: 639 | Disposition: A | Discharge status: Home / Self Care | Payer: 59 | Attending: Internal Medicine | Admitting: Internal Medicine

## 2016-09-21 ENCOUNTER — Encounter (HOSPITAL_COMMUNITY): Payer: Self-pay | Admitting: Emergency Medicine

## 2016-09-21 DIAGNOSIS — Z9641 Presence of insulin pump (external) (internal): Secondary | ICD-10-CM | POA: Diagnosis present

## 2016-09-21 DIAGNOSIS — R079 Chest pain, unspecified: Secondary | ICD-10-CM | POA: Diagnosis not present

## 2016-09-21 DIAGNOSIS — Z8249 Family history of ischemic heart disease and other diseases of the circulatory system: Secondary | ICD-10-CM

## 2016-09-21 DIAGNOSIS — E0810 Diabetes mellitus due to underlying condition with ketoacidosis without coma: Secondary | ICD-10-CM | POA: Diagnosis not present

## 2016-09-21 DIAGNOSIS — E10319 Type 1 diabetes mellitus with unspecified diabetic retinopathy without macular edema: Secondary | ICD-10-CM | POA: Diagnosis present

## 2016-09-21 DIAGNOSIS — E1110 Type 2 diabetes mellitus with ketoacidosis without coma: Secondary | ICD-10-CM | POA: Diagnosis present

## 2016-09-21 DIAGNOSIS — K219 Gastro-esophageal reflux disease without esophagitis: Secondary | ICD-10-CM | POA: Diagnosis present

## 2016-09-21 DIAGNOSIS — E1010 Type 1 diabetes mellitus with ketoacidosis without coma: Principal | ICD-10-CM | POA: Diagnosis present

## 2016-09-21 DIAGNOSIS — R112 Nausea with vomiting, unspecified: Secondary | ICD-10-CM | POA: Diagnosis present

## 2016-09-21 DIAGNOSIS — I10 Essential (primary) hypertension: Secondary | ICD-10-CM | POA: Diagnosis present

## 2016-09-21 DIAGNOSIS — R140 Abdominal distension (gaseous): Secondary | ICD-10-CM

## 2016-09-21 DIAGNOSIS — Z79899 Other long term (current) drug therapy: Secondary | ICD-10-CM

## 2016-09-21 DIAGNOSIS — R072 Precordial pain: Secondary | ICD-10-CM | POA: Diagnosis present

## 2016-09-21 DIAGNOSIS — E876 Hypokalemia: Secondary | ICD-10-CM | POA: Diagnosis present

## 2016-09-21 DIAGNOSIS — Z794 Long term (current) use of insulin: Secondary | ICD-10-CM | POA: Diagnosis not present

## 2016-09-21 DIAGNOSIS — Z833 Family history of diabetes mellitus: Secondary | ICD-10-CM | POA: Diagnosis not present

## 2016-09-21 DIAGNOSIS — R1110 Vomiting, unspecified: Secondary | ICD-10-CM

## 2016-09-21 DIAGNOSIS — L02429 Furuncle of limb, unspecified: Secondary | ICD-10-CM | POA: Diagnosis present

## 2016-09-21 LAB — CBC
HCT: 51.1 % (ref 39.0–52.0)
HEMOGLOBIN: 17.8 g/dL — AB (ref 13.0–17.0)
MCH: 29.3 pg (ref 26.0–34.0)
MCHC: 34.8 g/dL (ref 30.0–36.0)
MCV: 84.2 fL (ref 78.0–100.0)
Platelets: 313 10*3/uL (ref 150–400)
RBC: 6.07 MIL/uL — AB (ref 4.22–5.81)
RDW: 12.7 % (ref 11.5–15.5)
WBC: 13.1 10*3/uL — ABNORMAL HIGH (ref 4.0–10.5)

## 2016-09-21 LAB — COMPREHENSIVE METABOLIC PANEL
ALT: 14 U/L — ABNORMAL LOW (ref 17–63)
ANION GAP: 21 — AB (ref 5–15)
AST: 17 U/L (ref 15–41)
Albumin: 4.9 g/dL (ref 3.5–5.0)
Alkaline Phosphatase: 98 U/L (ref 38–126)
BUN: 14 mg/dL (ref 6–20)
CHLORIDE: 104 mmol/L (ref 101–111)
CO2: 9 mmol/L — AB (ref 22–32)
Calcium: 9.5 mg/dL (ref 8.9–10.3)
Creatinine, Ser: 1.2 mg/dL (ref 0.61–1.24)
GFR calc non Af Amer: >60 mL/min (ref >60)
Glucose, Bld: 387 mg/dL — ABNORMAL HIGH (ref 65–99)
POTASSIUM: 4.8 mmol/L (ref 3.5–5.1)
SODIUM: 134 mmol/L — AB (ref 135–145)
Total Bilirubin: 1.9 mg/dL — ABNORMAL HIGH (ref 0.3–1.2)
Total Protein: 8.7 g/dL — ABNORMAL HIGH (ref 6.5–8.1)

## 2016-09-21 LAB — CBG MONITORING, ED
Glucose-Capillary: 288 mg/dL — ABNORMAL HIGH (ref 65–99)
Glucose-Capillary: 335 mg/dL — ABNORMAL HIGH (ref 65–99)
Glucose-Capillary: 358 mg/dL — ABNORMAL HIGH (ref 65–99)

## 2016-09-21 LAB — URINALYSIS, ROUTINE W REFLEX MICROSCOPIC
BILIRUBIN URINE: NEGATIVE
Bacteria, UA: NONE SEEN
Glucose, UA: >=500 mg/dL — AB
KETONES UR: 80 mg/dL — AB
LEUKOCYTES UA: NEGATIVE
Nitrite: NEGATIVE
PROTEIN: 30 mg/dL — AB
SQUAMOUS EPITHELIAL / LPF: NONE SEEN
Specific Gravity, Urine: 1.032 — ABNORMAL HIGH (ref 1.005–1.030)
pH: 5 (ref 5.0–8.0)

## 2016-09-21 LAB — BLOOD GAS, VENOUS
Acid-base deficit: 19.1 mmol/L — ABNORMAL HIGH (ref 0.0–2.0)
Bicarbonate: 9.2 mmol/L — ABNORMAL LOW (ref 20.0–28.0)
O2 Saturation: 67.8 %
PCO2 VEN: 27.1 mmHg — AB (ref 44.0–60.0)
PH VEN: 7.156 — AB (ref 7.250–7.430)
PO2 VEN: 39.5 mmHg (ref 32.0–45.0)
Patient temperature: 98.6

## 2016-09-21 LAB — LIPASE, BLOOD: LIPASE: 16 U/L (ref 11–51)

## 2016-09-21 LAB — I-STAT TROPONIN, ED: Troponin i, poc: 0 ng/mL (ref 0.00–0.08)

## 2016-09-21 MED ORDER — SODIUM CHLORIDE 0.9 % IV BOLUS (SEPSIS): 1000.0000 mL | Freq: Once | INTRAVENOUS | Status: DC

## 2016-09-21 MED ORDER — DEXTROSE-NACL 5-0.45 % IV SOLN: INTRAVENOUS | Status: DC

## 2016-09-21 MED ORDER — SODIUM CHLORIDE 0.9 % IV BOLUS (SEPSIS)
1000.0000 mL | Freq: Once | INTRAVENOUS | Status: AC
  Administered 2016-09-21: 1000 mL via INTRAVENOUS

## 2016-09-21 MED ORDER — ONDANSETRON HCL 4 MG/2ML IJ SOLN
4.0000 mg | Freq: Once | INTRAMUSCULAR | Status: AC
  Administered 2016-09-21: 4 mg via INTRAVENOUS
  Filled 2016-09-21: qty 2

## 2016-09-21 MED ORDER — PROMETHAZINE HCL 25 MG/ML IJ SOLN
12.5000 mg | Freq: Once | INTRAMUSCULAR | Status: AC
  Administered 2016-09-21: 12.5 mg via INTRAVENOUS
  Filled 2016-09-21: qty 1

## 2016-09-21 MED ORDER — SODIUM CHLORIDE 0.9 % IV SOLN
INTRAVENOUS | Status: DC
  Administered 2016-09-21: 23:00:00 via INTRAVENOUS

## 2016-09-21 MED ORDER — SODIUM CHLORIDE 0.9 % IV SOLN
INTRAVENOUS | Status: DC
  Administered 2016-09-21: 2.8 [IU]/h via INTRAVENOUS
  Filled 2016-09-21: qty 1

## 2016-09-21 NOTE — ED Triage Notes (Signed)
Patient states he has not been feeling well for weeks. Patient has lost about 30 pounds unintentionally in a little over a month. Per wife he has dropped two pant sizes. Patient is a type 1 diabetic and states he feels like he's in DKA. Sugar was 231 at home, 358 in triage. Around 1600 patient began having N/V/D and weakness. Unable to keep anything down. Patient is actively vomiting in triage. Wife expressed concerns about sores on the patient's legs not healing.

## 2016-09-21 NOTE — ED Notes (Signed)
Patient transported to X-ray 

## 2016-09-21 NOTE — ED Provider Notes (Signed)
[facility] DEPT Provider Note   CSN: [id number] Arrival date & time: 09/21/16  2028     History   Chief Complaint Chief Complaint  Patient presents with  . Hyperglycemia  . Emesis    HPI CINSERE Barry Pittman is a 31 y.o. male.  Pt presents to the ED today with possible DKA.  Pt has a hx of type 1 DM, and has had DKA in the past.  The pt has not been feeling well since last night.  The pt started vomiting today.  The pt's wife said his breathing is rapid like it gets when he's in dka.      Past Medical History:  Diagnosis Date  . Diabetes mellitus without complication ([place])   . Hypertension     Patient Active Problem List   Diagnosis Date Noted  . 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 Hyperkalemia 05/27/2015  . Hyponatremia 05/27/2015  . Acute kidney injury ([place]) 05/27/2015  . Thrombocytosis ([place]) 05/27/2015  . Leukocytosis 05/27/2015  . Intractable nausea and vomiting 05/27/2015  . Acute respiratory failure with hypoxia ([place]) 05/27/2015  . DKA (diabetic ketoacidoses) ([place]) 05/26/2015    Past Surgical History:  Procedure Laterality Date  . EYE SURGERY     For retinopathy        Home Medications    Prior to Admission medications   Medication Sig Start Date End Date Taking? Authorizing Provider  insulin aspart (NOVOLOG) 100 UNIT/ML injection Inject 86 units into the skin over 24 hours via pump 06/21/16  Yes Philemon Kingdom, MD  lisinopril (PRINIVIL,ZESTRIL) 20 MG tablet Take 1 tablet (20 mg total) by mouth daily. 06/20/16  Yes Philemon Kingdom, MD  Multiple Vitamins-Minerals (MULTIVITAMIN ADULTS PO) Take 1 tablet by mouth daily.   Yes [provider]  glucagon (GLUCAGON EMERGENCY) 1 MG injection Inject 1 mg into the muscle once as needed. Patient taking differently: Inject 1 mg into the muscle daily as needed (low blood sugar).  06/20/16   Philemon Kingdom, MD  glucose blood (FREESTYLE LITE) test strip Use as instructed to  check sugar 4 times daily 06/20/16   Philemon Kingdom, MD    Family History Family History  Problem Relation Age of Onset  . Diabetes Mother   . Heart disease Mother   . Diabetes Father   . Heart disease Father     Social History Social History  Substance Use Topics  . Smoking status: Never Smoker  . Smokeless tobacco: Never Used  . Alcohol use Yes     Allergies   Patient has no known allergies.   Review of Systems Review of Systems  Gastrointestinal: Positive for abdominal pain, nausea and vomiting.  Endocrine: Positive for polydipsia and polyuria.  All other systems reviewed and are negative.    Physical Exam Updated Vital Signs BP (!) 125/59   Pulse (!) 103   Resp 18   Ht 5\' 11"  (1.803 m)   Wt 106.6 kg (235 lb)   SpO2 99%   BMI 32.78 kg/m   Physical Exam  Constitutional: He is oriented to person, place, and time. He appears well-developed. He appears distressed.  HENT:  Head: Normocephalic and atraumatic.  Right Ear: External ear normal.  Left Ear: External ear normal.  Nose: Nose normal.  Mouth/Throat: Mucous membranes are dry.  Eyes: Conjunctivae and EOM are normal. Pupils are equal, round, and reactive to light.  Neck: Normal range of motion. Neck supple.  Cardiovascular: Regular rhythm, normal heart sounds and  intact distal pulses.  Tachycardia present.   Pulmonary/Chest: Breath sounds normal. Tachypnea noted.  Abdominal: Soft. Bowel sounds are normal.  Musculoskeletal: Normal range of motion.  Neurological: He is alert and oriented to person, place, and time.  Skin: Skin is warm.  Sores to left lower leg.  Not infected, but not quite healed.  Psychiatric: He has a normal mood and affect. His behavior is normal. Judgment and thought content normal.  Nursing note and vitals reviewed.    ED Treatments / Results  Labs (all labs ordered are listed, but only abnormal results are displayed) Labs Reviewed  COMPREHENSIVE METABOLIC PANEL - Abnormal;  Notable for the following:       Result Value   Sodium 134 (*)    CO2 9 (*)    Glucose, Bld 387 (*)    Total Protein 8.7 (*)    ALT 14 (*)    Total Bilirubin 1.9 (*)    Anion gap 21 (*)    All other components within normal limits  CBC - Abnormal; Notable for the following:    WBC 13.1 (*)    RBC 6.07 (*)    Hemoglobin 17.8 (*)    All other components within normal limits  URINALYSIS, ROUTINE W REFLEX MICROSCOPIC - Abnormal; Notable for the following:    Color, Urine STRAW (*)    Specific Gravity, Urine 1.032 (*)    Glucose, UA >=500 (*)    Hgb urine dipstick MODERATE (*)    Ketones, ur 80 (*)    Protein, ur 30 (*)    All other components within normal limits  BLOOD GAS, VENOUS - Abnormal; Notable for the following:    pH, Ven 7.156 (*)    pCO2, Ven 27.1 (*)    Bicarbonate 9.2 (*)    Acid-base deficit 19.1 (*)    All other components within normal limits  CBG MONITORING, ED - Abnormal; Notable for the following:    Glucose-Capillary 358 (*)    All other components within normal limits  CBG MONITORING, ED - Abnormal; Notable for the following:    Glucose-Capillary 335 (*)    All other components within normal limits  LIPASE, BLOOD  I-STAT TROPOININ, ED    EKG  EKG Interpretation None      EKG:  Sinus tachycardia. HR110.  No st or t wave changes.  Radiology Dg Chest 2 View  Result Date: 09/21/2016 CLINICAL DATA:  Generalized chest pain with shortness of breath EXAM: CHEST  2 VIEW COMPARISON:  05/26/2015 FINDINGS: The heart size and mediastinal contours are within normal limits. Both lungs are clear. The visualized skeletal structures are unremarkable. IMPRESSION: No active cardiopulmonary disease. Electronically Signed   By: Donavan Foil M.D.   On: 09/21/2016 21:50    Procedures Procedures (including critical care time)  Medications Ordered in ED Medications  sodium chloride 0.9 % bolus 1,000 mL (0 mLs Intravenous Stopped 09/21/16 2242)    And  0.9 %  sodium  chloride infusion ( Intravenous New Bag/Given 09/21/16 2242)  dextrose 5 %-0.45 % sodium chloride infusion (not administered)  insulin regular (NOVOLIN R,HUMULIN R) 100 Units in sodium chloride 0.9 % 100 mL (1 Units/mL) infusion (2.8 Units/hr Intravenous New Bag/Given 09/21/16 2258)  sodium chloride 0.9 % bolus 1,000 mL (not administered)  ondansetron (ZOFRAN) injection 4 mg (4 mg Intravenous Given 09/21/16 2133)  promethazine (PHENERGAN) injection 12.5 mg (12.5 mg Intravenous Given 09/21/16 2245)     Initial Impression / Assessment and Plan / ED  Course  I have reviewed the triage vital signs and the nursing notes.  Pertinent labs & imaging results that were available during my care of the patient were reviewed by me and considered in my medical decision making (see chart for details).  Pt is in DKA.  Insulin drip started.  Pt is wearing an insulin pump, so I told him to turn that off.  Pt given IV fluids.    CRITICAL CARE Performed by: Isla Pence   Total critical care time: 30 minutes  Critical care time was exclusive of separately billable procedures and treating other patients.  Critical care was necessary to treat or prevent imminent or life-threatening deterioration.  Critical care was time spent personally by me on the following activities: development of treatment plan with patient and/or surrogate as well as nursing, discussions with consultants, evaluation of patient's response to treatment, examination of patient, obtaining history from patient or surrogate, ordering and performing treatments and interventions, ordering and review of laboratory studies, ordering and review of radiographic studies, pulse oximetry and re-evaluation of patient's condition.  Pt d/w Dr. Hal Hope ([facility]) who will admit.  Final Clinical Impressions(s) / ED Diagnoses   Final diagnoses:  Diabetic ketoacidosis without coma associated with type 1 diabetes mellitus ([place])    New Prescriptions New  Prescriptions   No medications on file     Isla Pence, MD 09/21/16 2304

## 2016-09-21 NOTE — ED Notes (Signed)
Pt is alert and orinted x 4 and is verbal responsive. Pt reports pain in chest and stomach. Pt reports that he has n/v/d x 1 day.

## 2016-09-22 ENCOUNTER — Inpatient Hospital Stay (HOSPITAL_COMMUNITY): Payer: 59

## 2016-09-22 ENCOUNTER — Encounter (HOSPITAL_COMMUNITY): Payer: Self-pay | Admitting: Internal Medicine

## 2016-09-22 DIAGNOSIS — R079 Chest pain, unspecified: Secondary | ICD-10-CM

## 2016-09-22 DIAGNOSIS — E0810 Diabetes mellitus due to underlying condition with ketoacidosis without coma: Secondary | ICD-10-CM

## 2016-09-22 DIAGNOSIS — E1010 Type 1 diabetes mellitus with ketoacidosis without coma: Secondary | ICD-10-CM | POA: Diagnosis present

## 2016-09-22 LAB — BASIC METABOLIC PANEL
ANION GAP: 21 — AB (ref 5–15)
ANION GAP: 15 (ref 5–15)
ANION GAP: 8 (ref 5–15)
ANION GAP: 10 (ref 5–15)
Anion gap: 16 — ABNORMAL HIGH (ref 5–15)
Anion gap: 11 (ref 5–15)
BUN: 14 mg/dL (ref 6–20)
BUN: 15 mg/dL (ref 6–20)
BUN: 15 mg/dL (ref 6–20)
BUN: 46 mg/dL — ABNORMAL HIGH (ref 6–20)
BUN: 14 mg/dL (ref 6–20)
BUN: 13 mg/dL (ref 6–20)
CALCIUM: 9.1 mg/dL (ref 8.9–10.3)
CALCIUM: 9.1 mg/dL (ref 8.9–10.3)
CALCIUM: 6.8 mg/dL — AB (ref 8.9–10.3)
CALCIUM: 9 mg/dL (ref 8.9–10.3)
CALCIUM: 9.2 mg/dL (ref 8.9–10.3)
CHLORIDE: 110 mmol/L (ref 101–111)
CHLORIDE: 111 mmol/L (ref 101–111)
CO2: 7 mmol/L — ABNORMAL LOW (ref 22–32)
CO2: 10 mmol/L — AB (ref 22–32)
CO2: 12 mmol/L — AB (ref 22–32)
CO2: 19 mmol/L — AB (ref 22–32)
CO2: 16 mmol/L — ABNORMAL LOW (ref 22–32)
CO2: 16 mmol/L — AB (ref 22–32)
CREATININE: 1.23 mg/dL (ref 0.61–1.24)
CREATININE: 1.04 mg/dL (ref 0.61–1.24)
CREATININE: 0.88 mg/dL (ref 0.61–1.24)
Calcium: 9.1 mg/dL (ref 8.9–10.3)
Chloride: 112 mmol/L — ABNORMAL HIGH (ref 101–111)
Chloride: 112 mmol/L — ABNORMAL HIGH (ref 101–111)
Chloride: 108 mmol/L (ref 101–111)
Chloride: 112 mmol/L — ABNORMAL HIGH (ref 101–111)
Creatinine, Ser: 1.22 mg/dL (ref 0.61–1.24)
Creatinine, Ser: 1.65 mg/dL — ABNORMAL HIGH (ref 0.61–1.24)
Creatinine, Ser: 0.87 mg/dL (ref 0.61–1.24)
GFR calc Af Amer: >60 mL/min (ref >60)
GFR calc Af Amer: >60 mL/min (ref >60)
GFR calc Af Amer: >60 mL/min (ref >60)
GFR calc Af Amer: >60 mL/min (ref >60)
GFR calc Af Amer: >60 mL/min (ref >60)
GFR calc non Af Amer: >60 mL/min (ref >60)
GFR calc non Af Amer: >60 mL/min (ref >60)
GFR calc non Af Amer: 54 mL/min — ABNORMAL LOW (ref >60)
GFR calc non Af Amer: >60 mL/min (ref >60)
GFR calc non Af Amer: >60 mL/min (ref >60)
GLUCOSE: 195 mg/dL — AB (ref 65–99)
GLUCOSE: 179 mg/dL — AB (ref 65–99)
GLUCOSE: 208 mg/dL — AB (ref 65–99)
GLUCOSE: 153 mg/dL — AB (ref 65–99)
Glucose, Bld: 268 mg/dL — ABNORMAL HIGH (ref 65–99)
Glucose, Bld: 165 mg/dL — ABNORMAL HIGH (ref 65–99)
Potassium: 5.2 mmol/L — ABNORMAL HIGH (ref 3.5–5.1)
Potassium: 4.4 mmol/L (ref 3.5–5.1)
Potassium: 4.4 mmol/L (ref 3.5–5.1)
Potassium: 3.6 mmol/L (ref 3.5–5.1)
Potassium: 3.9 mmol/L (ref 3.5–5.1)
Potassium: 3.6 mmol/L (ref 3.5–5.1)
SODIUM: 138 mmol/L (ref 135–145)
Sodium: 138 mmol/L (ref 135–145)
Sodium: 139 mmol/L (ref 135–145)
Sodium: 135 mmol/L (ref 135–145)
Sodium: 138 mmol/L (ref 135–145)
Sodium: 138 mmol/L (ref 135–145)

## 2016-09-22 LAB — GLUCOSE, CAPILLARY
GLUCOSE-CAPILLARY: 154 mg/dL — AB (ref 65–99)
GLUCOSE-CAPILLARY: 155 mg/dL — AB (ref 65–99)
GLUCOSE-CAPILLARY: 156 mg/dL — AB (ref 65–99)
GLUCOSE-CAPILLARY: 163 mg/dL — AB (ref 65–99)
GLUCOSE-CAPILLARY: 165 mg/dL — AB (ref 65–99)
GLUCOSE-CAPILLARY: 167 mg/dL — AB (ref 65–99)
GLUCOSE-CAPILLARY: 167 mg/dL — AB (ref 65–99)
GLUCOSE-CAPILLARY: 216 mg/dL — AB (ref 65–99)
GLUCOSE-CAPILLARY: 248 mg/dL — AB (ref 65–99)
Glucose-Capillary: 300 mg/dL — ABNORMAL HIGH (ref 65–99)
Glucose-Capillary: 250 mg/dL — ABNORMAL HIGH (ref 65–99)
Glucose-Capillary: 170 mg/dL — ABNORMAL HIGH (ref 65–99)
Glucose-Capillary: 174 mg/dL — ABNORMAL HIGH (ref 65–99)
Glucose-Capillary: 170 mg/dL — ABNORMAL HIGH (ref 65–99)
Glucose-Capillary: 166 mg/dL — ABNORMAL HIGH (ref 65–99)
Glucose-Capillary: 157 mg/dL — ABNORMAL HIGH (ref 65–99)
Glucose-Capillary: 133 mg/dL — ABNORMAL HIGH (ref 65–99)
Glucose-Capillary: 154 mg/dL — ABNORMAL HIGH (ref 65–99)
Glucose-Capillary: 158 mg/dL — ABNORMAL HIGH (ref 65–99)
Glucose-Capillary: 152 mg/dL — ABNORMAL HIGH (ref 65–99)
Glucose-Capillary: 167 mg/dL — ABNORMAL HIGH (ref 65–99)

## 2016-09-22 LAB — HIV ANTIBODY (ROUTINE TESTING W REFLEX): HIV Screen 4th Generation wRfx: NONREACTIVE

## 2016-09-22 LAB — ECHOCARDIOGRAM COMPLETE
HEIGHTINCHES: 71 in
Weight: 3760 oz

## 2016-09-22 LAB — MRSA PCR SCREENING: MRSA by PCR: NEGATIVE

## 2016-09-22 LAB — TROPONIN I

## 2016-09-22 MED ORDER — SODIUM CHLORIDE 0.9 % IV SOLN: INTRAVENOUS | Status: DC

## 2016-09-22 MED ORDER — HYDRALAZINE HCL 20 MG/ML IJ SOLN
10.0000 mg | INTRAMUSCULAR | Status: DC | PRN
  Administered 2016-09-22 – 2016-09-24 (×2): 10 mg via INTRAVENOUS
  Filled 2016-09-22 (×3): qty 1
  Filled 2016-09-22: qty 0.5

## 2016-09-22 MED ORDER — LORAZEPAM 2 MG/ML IJ SOLN
INTRAMUSCULAR | Status: AC
  Administered 2016-09-22: 0.5 mg via INTRAVENOUS
  Filled 2016-09-22: qty 1

## 2016-09-22 MED ORDER — ENOXAPARIN SODIUM 40 MG/0.4ML ~~LOC~~ SOLN
40.0000 mg | Freq: Every day | SUBCUTANEOUS | Status: DC
  Administered 2016-09-22 – 2016-09-26 (×5): 40 mg via SUBCUTANEOUS
  Filled 2016-09-22 (×5): qty 0.4

## 2016-09-22 MED ORDER — LORAZEPAM 2 MG/ML IJ SOLN
0.5000 mg | Freq: Once | INTRAMUSCULAR | Status: AC
  Administered 2016-09-22: 0.5 mg via INTRAVENOUS

## 2016-09-22 MED ORDER — ASPIRIN EC 81 MG PO TBEC
81.0000 mg | DELAYED_RELEASE_TABLET | Freq: Every day | ORAL | Status: DC
  Administered 2016-09-22 – 2016-09-27 (×5): 81 mg via ORAL
  Filled 2016-09-22 (×6): qty 1

## 2016-09-22 MED ORDER — SODIUM CHLORIDE 0.9 % IV SOLN
INTRAVENOUS | Status: DC
  Administered 2016-09-22: 12:00:00 via INTRAVENOUS
  Administered 2016-09-23: 7 [IU]/h via INTRAVENOUS
  Filled 2016-09-22 (×3): qty 1

## 2016-09-22 MED ORDER — DOXYCYCLINE HYCLATE 100 MG IV SOLR
100.0000 mg | Freq: Two times a day (BID) | INTRAVENOUS | Status: DC
  Administered 2016-09-22 – 2016-09-24 (×5): 100 mg via INTRAVENOUS
  Filled 2016-09-22 (×5): qty 100

## 2016-09-22 MED ORDER — DEXTROSE-NACL 5-0.45 % IV SOLN
INTRAVENOUS | Status: DC
  Administered 2016-09-22 – 2016-09-23 (×3): via INTRAVENOUS

## 2016-09-22 MED ORDER — ZOLPIDEM TARTRATE 5 MG PO TABS
5.0000 mg | ORAL_TABLET | Freq: Once | ORAL | Status: AC
  Administered 2016-09-22: 5 mg via ORAL
  Filled 2016-09-22: qty 1

## 2016-09-22 MED ORDER — PROMETHAZINE HCL 25 MG/ML IJ SOLN
12.5000 mg | Freq: Once | INTRAMUSCULAR | Status: AC
  Administered 2016-09-22: 12.5 mg via INTRAVENOUS

## 2016-09-22 MED ORDER — IOPAMIDOL (ISOVUE-300) INJECTION 61%
INTRAVENOUS | Status: AC
  Administered 2016-09-22: 100 mL
  Filled 2016-09-22: qty 100

## 2016-09-22 MED ORDER — SODIUM CHLORIDE 0.9 % IV BOLUS (SEPSIS)
500.0000 mL | Freq: Once | INTRAVENOUS | Status: AC
  Administered 2016-09-22: 500 mL via INTRAVENOUS

## 2016-09-22 MED ORDER — PROMETHAZINE HCL 25 MG/ML IJ SOLN
12.5000 mg | Freq: Four times a day (QID) | INTRAMUSCULAR | Status: DC | PRN
  Administered 2016-09-22 – 2016-09-23 (×4): 12.5 mg via INTRAVENOUS
  Filled 2016-09-22 (×5): qty 1

## 2016-09-22 MED ORDER — ONDANSETRON HCL 4 MG/2ML IJ SOLN
4.0000 mg | Freq: Once | INTRAMUSCULAR | Status: AC
  Administered 2016-09-22: 4 mg via INTRAVENOUS
  Filled 2016-09-22: qty 2

## 2016-09-22 NOTE — Progress Notes (Signed)
  Echocardiogram 2D Echocardiogram has been performed.  Barry Pittman 09/22/2016, 12:25 PM

## 2016-09-22 NOTE — Care Management Note (Signed)
Case Management Note  Patient Details  Name: Barry Pittman MRN: [id number] Date of Birth: [birth year]  Subjective/Objective:                  31 y.o. male with history of diabetes mellitus type 1 on insulin pump and has been having increasing weakness weight loss over the last few days. Yesterday patient had multiple episodes of vomiting denies any diarrhea. Blood sugar has remained consistently high and patient was brought to the ER. Patient states his Humalog insulin was recently converted to NovoLog and thinks that symptoms are worsened since then. Patient also has noticed some boils on the lower extremities. Says last evening patient also has been having some retrosternal chest pain burning and pressure-like after he started vomiting.  ED Course: In the ER patient was found to have elevated blood sugar with bicarbonate of 9 and anion gap of 21. Chest x-ray was unremarkable. Patient was started on IV fluid bolus and insulin infusion for diabetic ketoacidosis.  Action/Plan: Date:  September 22, 2016  Chart reviewed for concurrent status and case management needs.  Will continue to follow patient progress.  Discharge Planning: following for needs  Expected discharge date: [id number]  Velva Harman, BSN, [place], [place]   Expected Discharge Date:                  Expected Discharge Plan:  Home/Self Care  In-House Referral:     Discharge planning Services  CM Consult  Post Acute Care Choice:    Choice offered to:     DME Arranged:    DME Agency:     HH Arranged:    HH Agency:     Status of Service:  In process, will continue to follow  If discussed at Long Length of Stay Meetings, dates discussed:    Additional Comments:  Leeroy Cha, RN 09/22/2016, 10:52 AM

## 2016-09-22 NOTE — Progress Notes (Addendum)
Inpatient Diabetes Program Recommendations  AACE/ADA: New Consensus Statement on Inpatient Glycemic Control (2015)  Target Ranges:  Prepandial:   less than 140 mg/dL      Peak postprandial:   less than 180 mg/dL (1-2 hours)      Critically ill patients:  140 - 180 mg/dL   Lab Results  Component Value Date   GLUCAP 157 (H) 09/22/2016   HGBA1C 10.6 06/21/2016    Review of Glycemic Control    Diabetes history: DM1 since age 31 Outpatient Diabetes medications: Insulin pump with Novolog. Settings below. Current orders for Inpatient glycemic control: IV insulin per DKA order set. Dr. Cruzita Lederer is endo (has seen MD 1x in March 2018) He started on an insulin pump in 2000.  Last hemoglobin A1c was: 06/20/2016: Hba1c 10.6% RecentLabs    CO2 - 16. Per Dr. Arman Filter note: Pt is on an insulin pump: Minimed Paradigm 722, without CGM, uses Humalog in the pump.  Pump settings: - basal rates: 12 am: 1.4 units/h 4 am: 2.85 6 am: 3.5 9 am: 4 11 am: 3.5 10 pm: 2.1 - ICR:              12 am: 10             6 pm: 8 - target: 80-120 - ISF: 30 - Insulin on Board: 6h - bolus wizard: on TDD from basal insulin: 87% TDD from bolus insulin: 13% - extended bolusing: not using - changes infusion site: seldom (!) - Meter: Freestyle Lite  Attempted to speak with pt and he felt too bad to converse. Spoke with wife and aunt. Wife states he's lost about 30 pounds since he switched from Humalog to Novolog  In his pump. Changed insulin d/t insurance coverage. Wife states pt was doing well on Humalog. Has had increase in boils on legs in past month.  MD-Patient will need all new insulin pump supplies if the plan is to restart his insulin pump after transitioning off IV insulin drip.  Per hospital Insulin Pump policy, patients are encouraged to call the 1-800# for the pump manufacturer to perform a safety check on their insulin pump and then should use all new supplies (new tubing, reservoir,  insulin, insertion site, etc) when restarting their insulin pump.    Once patient ready to transition back to SQ insulin, Safest way to transition patient back to insulin pump would be to have patient resume insulin pump with all new supplies, continue IV insulin drip for one hour after insulin pump restarted, and then d/c IV insulin drip.  Will follow patient during hospitalization.   Thank you. Lorenda Peck, RD, LDN, CDE Inpatient Diabetes Coordinator [phone]

## 2016-09-22 NOTE — ED Notes (Signed)
Report given to [place] on 2W. Unit receiving multiple pt and request 20 minutes prior to bring pt.

## 2016-09-22 NOTE — H&P (Signed)
History and Physical    Barry Pittman:[id number] DOB: [birth year] DOA: 09/21/2016  PCP: Patient, No Pcp Per  Patient coming from: Home.  Chief Complaint: Elevated blood sugar. Vomiting.  HPI: Barry Pittman is a 31 y.o. male with history of diabetes mellitus type 1 on insulin pump and has been having increasing weakness weight loss over the last few days. Yesterday patient had multiple episodes of vomiting denies any diarrhea. Blood sugar has remained consistently high and patient was brought to the ER. Patient states his Humalog insulin was recently converted to NovoLog and thinks that symptoms are worsened since then. Patient also has noticed some boils on the lower extremities. Says last evening patient also has been having some retrosternal chest pain burning and pressure-like after he started vomiting.  ED Course: In the ER patient was found to have elevated blood sugar with bicarbonate of 9 and anion gap of 21. Chest x-ray was unremarkable. Patient was started on IV fluid bolus and insulin infusion for diabetic ketoacidosis.  Review of Systems: As per HPI, rest all negative.   Past Medical History:  Diagnosis Date  . Diabetes mellitus without complication ([place])   . Hypertension     Past Surgical History:  Procedure Laterality Date  . EYE SURGERY     For retinopathy      reports that he has never smoked. He has never used smokeless tobacco. He reports that he drinks alcohol. He reports that he does not use drugs.  No Known Allergies  Family History  Problem Relation Age of Onset  . Diabetes Mother   . Heart disease Mother   . Diabetes Father   . Heart disease Father     Prior to Admission medications   Medication Sig Start Date End Date Taking? Authorizing Provider  insulin aspart (NOVOLOG) 100 UNIT/ML injection Inject 86 units into the skin over 24 hours via pump 06/21/16  Yes Philemon Kingdom, MD  lisinopril (PRINIVIL,ZESTRIL) 20 MG tablet Take 1  tablet (20 mg total) by mouth daily. 06/20/16  Yes Philemon Kingdom, MD  Multiple Vitamins-Minerals (MULTIVITAMIN ADULTS PO) Take 1 tablet by mouth daily.   Yes [provider]  glucagon (GLUCAGON EMERGENCY) 1 MG injection Inject 1 mg into the muscle once as needed. Patient taking differently: Inject 1 mg into the muscle daily as needed (low blood sugar).  06/20/16   Philemon Kingdom, MD  glucose blood (FREESTYLE LITE) test strip Use as instructed to check sugar 4 times daily 06/20/16   Philemon Kingdom, MD    Physical Exam: Vitals:   09/21/16 2300 09/21/16 2319 09/21/16 2330 09/22/16 0002  BP: (!) 143/73 (!) 143/73 (!) 148/85 (!) 153/86  Pulse: (!) 112 (!) 112 (!) 108 (!) 112  Resp:  18  18  SpO2: 100% 100% 100% 100%  Weight:      Height:          Constitutional: Moderately built and nourished. Vitals:   09/21/16 2300 09/21/16 2319 09/21/16 2330 09/22/16 0002  BP: (!) 143/73 (!) 143/73 (!) 148/85 (!) 153/86  Pulse: (!) 112 (!) 112 (!) 108 (!) 112  Resp:  18  18  SpO2: 100% 100% 100% 100%  Weight:      Height:       Eyes: Anicteric. No pallor. ENMT: No discharge from the ears eyes nose and mouth. Neck: No mass felt. No neck rigidity. No JVD appreciated. Respiratory: No rhonchi or crepitations. Cardiovascular: S1-S2 heard no murmurs appreciated. Abdomen: Soft nontender bowel  sounds present. No guarding or rigidity. Musculoskeletal: No edema. No joint effusion. Skin: No rash. Skin appears warm. Neurologic: Alert awake oriented to time place and person. Moves all extremities. Psychiatric: Appears normal. Normal affect.   Labs on Admission: I have personally reviewed following labs and imaging studies  CBC:  Recent Labs Lab 09/21/16 2121  WBC 13.1*  HGB 17.8*  HCT 51.1  MCV 84.2  PLT 702   Basic Metabolic Panel:  Recent Labs Lab 09/21/16 2121  NA 134*  K 4.8  CL 104  CO2 9*  GLUCOSE 387*  BUN 14  CREATININE 1.20  CALCIUM 9.5   GFR: Estimated  Creatinine Clearance: 110.8 mL/min (by C-G formula based on SCr of 1.2 mg/dL). Liver Function Tests:  Recent Labs Lab 09/21/16 2121  AST 17  ALT 14*  ALKPHOS 98  BILITOT 1.9*  PROT 8.7*  ALBUMIN 4.9    Recent Labs Lab 09/21/16 2121  LIPASE 16   No results for input(s): AMMONIA in the last 168 hours. Coagulation Profile: No results for input(s): INR, PROTIME in the last 168 hours. Cardiac Enzymes: No results for input(s): CKTOTAL, CKMB, CKMBINDEX, TROPONINI in the last 168 hours. BNP (last 3 results) No results for input(s): PROBNP in the last 8760 hours. HbA1C: No results for input(s): HGBA1C in the last 72 hours. CBG:  Recent Labs Lab 09/21/16 2045 09/21/16 2243 09/21/16 2359  GLUCAP 358* 335* 288*   Lipid Profile: No results for input(s): CHOL, HDL, LDLCALC, TRIG, CHOLHDL, LDLDIRECT in the last 72 hours. Thyroid Function Tests: No results for input(s): TSH, T4TOTAL, FREET4, T3FREE, THYROIDAB in the last 72 hours. Anemia Panel: No results for input(s): VITAMINB12, FOLATE, FERRITIN, TIBC, IRON, RETICCTPCT in the last 72 hours. Urine analysis:    Component Value Date/Time   COLORURINE STRAW (A) 09/21/2016 2126   APPEARANCEUR CLEAR 09/21/2016 2126   LABSPEC 1.032 (H) 09/21/2016 2126   PHURINE 5.0 09/21/2016 2126   GLUCOSEU >=500 (A) 09/21/2016 2126   HGBUR MODERATE (A) 09/21/2016 2126   BILIRUBINUR NEGATIVE 09/21/2016 2126   KETONESUR 80 (A) 09/21/2016 2126   PROTEINUR 30 (A) 09/21/2016 2126   NITRITE NEGATIVE 09/21/2016 2126   LEUKOCYTESUR NEGATIVE 09/21/2016 2126   Sepsis Labs: @LABRCNTIP (procalcitonin:4,lacticidven:4) )No results found for this or any previous visit (from the past 240 hour(s)).   Radiological Exams on Admission: Dg Chest 2 View  Result Date: 09/21/2016 CLINICAL DATA:  Generalized chest pain with shortness of breath EXAM: CHEST  2 VIEW COMPARISON:  05/26/2015 FINDINGS: The heart size and mediastinal contours are within normal limits.  Both lungs are clear. The visualized skeletal structures are unremarkable. IMPRESSION: No active cardiopulmonary disease. Electronically Signed   By: Donavan Foil M.D.   On: 09/21/2016 21:50     Assessment/Plan Principal Problem:   DKA (diabetic ketoacidoses) ([facility]) Active Problems:   Intractable nausea and vomiting   Chest pain   DKA, type 1 ([place])    1. Diabetic ketoacidosis and type 1 diabetes - precipitating cause not clear. At this time we will continue with aggressive IV hydration closely for metabolic panel and IV insulin infusion. Hold insulin pump. Once anion gap gets correctly change to insulin pump. We discussed the patient's endocrinologist in a.m. about patient's concerns about NovoLog and Humalog insulin. 2. Nausea and vomiting - likely due to gastroparesis versus gastroenteritis. Abdomen appears benign. Closely observe. 3. Chest pain - may be related to vomiting. However since patient has risk factors we will cycle cardiac markers aspirin check EKG which  is pending and 2-D echo. 4. Hypertension - since patient is presently nothing by mouth I have placed patient on when necessary IV hydralazine. 5. Lower extremity boils - will keep patient on doxycycline for now.   DVT prophylaxis: Lovenox. Code Status: Full code.  Family Communication: Patient's wife.  Disposition Plan: Home.  Consults called: None.  Admission status: Inpatient.    Rise Patience MD [facility] Pager [phone].  If 7PM-7AM, please contact night-coverage [email or web address] Password [email or web address]  09/22/2016, 12:11 AM

## 2016-09-22 NOTE — Progress Notes (Signed)
Patient and seen at bedside, Pt admitted after midnight, please see earlier note by Dr. Hal Hope. Pt admitted with diagnosis of DKA. Pt reports he is still not feeling well, persistent abd cramps, N/V. CT abd and pelvis pending. BMP pending. Keep on IVF and insulin drip.   [name], MD  [facility] Pager [phone]  If 7PM-7AM, please contact night-coverage [email or web address] Password [email or web address]

## 2016-09-23 ENCOUNTER — Inpatient Hospital Stay (HOSPITAL_COMMUNITY): Payer: 59

## 2016-09-23 LAB — BASIC METABOLIC PANEL
Anion gap: 10 (ref 5–15)
Anion gap: 11 (ref 5–15)
Anion gap: 13 (ref 5–15)
Anion gap: 7 (ref 5–15)
Anion gap: 8 (ref 5–15)
Anion gap: 8 (ref 5–15)
BUN: 10 mg/dL (ref 6–20)
BUN: 11 mg/dL (ref 6–20)
BUN: 12 mg/dL (ref 6–20)
BUN: 12 mg/dL (ref 6–20)
BUN: 13 mg/dL (ref 6–20)
BUN: 14 mg/dL (ref 6–20)
CHLORIDE: 107 mmol/L (ref 101–111)
CHLORIDE: 107 mmol/L (ref 101–111)
CHLORIDE: 108 mmol/L (ref 101–111)
CHLORIDE: 111 mmol/L (ref 101–111)
CHLORIDE: 112 mmol/L — AB (ref 101–111)
CO2: 18 mmol/L — AB (ref 22–32)
CO2: 18 mmol/L — ABNORMAL LOW (ref 22–32)
CO2: 19 mmol/L — ABNORMAL LOW (ref 22–32)
CO2: 19 mmol/L — ABNORMAL LOW (ref 22–32)
CO2: 21 mmol/L — AB (ref 22–32)
CO2: 21 mmol/L — AB (ref 22–32)
Calcium: 8.7 mg/dL — ABNORMAL LOW (ref 8.9–10.3)
Calcium: 8.8 mg/dL — ABNORMAL LOW (ref 8.9–10.3)
Calcium: 8.9 mg/dL (ref 8.9–10.3)
Calcium: 9 mg/dL (ref 8.9–10.3)
Calcium: 9.1 mg/dL (ref 8.9–10.3)
Calcium: 9.3 mg/dL (ref 8.9–10.3)
Chloride: 106 mmol/L (ref 101–111)
Creatinine, Ser: 0.78 mg/dL (ref 0.61–1.24)
Creatinine, Ser: 0.78 mg/dL (ref 0.61–1.24)
Creatinine, Ser: 0.79 mg/dL (ref 0.61–1.24)
Creatinine, Ser: 0.82 mg/dL (ref 0.61–1.24)
Creatinine, Ser: 0.83 mg/dL (ref 0.61–1.24)
Creatinine, Ser: 0.84 mg/dL (ref 0.61–1.24)
GFR calc Af Amer: >60 mL/min (ref >60)
GFR calc Af Amer: >60 mL/min (ref >60)
GFR calc Af Amer: >60 mL/min (ref >60)
GFR calc Af Amer: >60 mL/min (ref >60)
GFR calc non Af Amer: >60 mL/min (ref >60)
GFR calc non Af Amer: >60 mL/min (ref >60)
GFR calc non Af Amer: >60 mL/min (ref >60)
GFR calc non Af Amer: >60 mL/min (ref >60)
GFR calc non Af Amer: >60 mL/min (ref >60)
GFR calc non Af Amer: >60 mL/min (ref >60)
GLUCOSE: 183 mg/dL — AB (ref 65–99)
Glucose, Bld: 183 mg/dL — ABNORMAL HIGH (ref 65–99)
Glucose, Bld: 170 mg/dL — ABNORMAL HIGH (ref 65–99)
Glucose, Bld: 197 mg/dL — ABNORMAL HIGH (ref 65–99)
Glucose, Bld: 168 mg/dL — ABNORMAL HIGH (ref 65–99)
Glucose, Bld: 246 mg/dL — ABNORMAL HIGH (ref 65–99)
POTASSIUM: 3 mmol/L — AB (ref 3.5–5.1)
POTASSIUM: 3.1 mmol/L — AB (ref 3.5–5.1)
POTASSIUM: 3.1 mmol/L — AB (ref 3.5–5.1)
POTASSIUM: 3.3 mmol/L — AB (ref 3.5–5.1)
POTASSIUM: 3.5 mmol/L (ref 3.5–5.1)
POTASSIUM: 3.5 mmol/L (ref 3.5–5.1)
SODIUM: 139 mmol/L (ref 135–145)
SODIUM: 137 mmol/L (ref 135–145)
SODIUM: 136 mmol/L (ref 135–145)
SODIUM: 137 mmol/L (ref 135–145)
Sodium: 137 mmol/L (ref 135–145)
Sodium: 138 mmol/L (ref 135–145)

## 2016-09-23 LAB — GLUCOSE, CAPILLARY
GLUCOSE-CAPILLARY: 144 mg/dL — AB (ref 65–99)
GLUCOSE-CAPILLARY: 155 mg/dL — AB (ref 65–99)
GLUCOSE-CAPILLARY: 163 mg/dL — AB (ref 65–99)
GLUCOSE-CAPILLARY: 165 mg/dL — AB (ref 65–99)
GLUCOSE-CAPILLARY: 170 mg/dL — AB (ref 65–99)
GLUCOSE-CAPILLARY: 175 mg/dL — AB (ref 65–99)
GLUCOSE-CAPILLARY: 177 mg/dL — AB (ref 65–99)
GLUCOSE-CAPILLARY: 185 mg/dL — AB (ref 65–99)
GLUCOSE-CAPILLARY: 192 mg/dL — AB (ref 65–99)
GLUCOSE-CAPILLARY: 198 mg/dL — AB (ref 65–99)
GLUCOSE-CAPILLARY: 213 mg/dL — AB (ref 65–99)
Glucose-Capillary: 190 mg/dL — ABNORMAL HIGH (ref 65–99)
Glucose-Capillary: 189 mg/dL — ABNORMAL HIGH (ref 65–99)
Glucose-Capillary: 163 mg/dL — ABNORMAL HIGH (ref 65–99)
Glucose-Capillary: 164 mg/dL — ABNORMAL HIGH (ref 65–99)
Glucose-Capillary: 156 mg/dL — ABNORMAL HIGH (ref 65–99)
Glucose-Capillary: 257 mg/dL — ABNORMAL HIGH (ref 65–99)

## 2016-09-23 LAB — URINALYSIS, ROUTINE W REFLEX MICROSCOPIC
Bilirubin Urine: NEGATIVE
Glucose, UA: >=500 mg/dL — AB
Ketones, ur: 20 mg/dL — AB
Leukocytes, UA: NEGATIVE
Nitrite: NEGATIVE
PROTEIN: 100 mg/dL — AB
SPECIFIC GRAVITY, URINE: 1.025 (ref 1.005–1.030)
Squamous Epithelial / LPF: NONE SEEN
pH: 6 (ref 5.0–8.0)

## 2016-09-23 LAB — CBC
HCT: 42.6 % (ref 39.0–52.0)
HEMOGLOBIN: 14.9 g/dL (ref 13.0–17.0)
MCH: 29.4 pg (ref 26.0–34.0)
MCHC: 35 g/dL (ref 30.0–36.0)
MCV: 84 fL (ref 78.0–100.0)
Platelets: 260 10*3/uL (ref 150–400)
RBC: 5.07 MIL/uL (ref 4.22–5.81)
RDW: 13.1 % (ref 11.5–15.5)
WBC: 11.6 10*3/uL — ABNORMAL HIGH (ref 4.0–10.5)

## 2016-09-23 MED ORDER — INSULIN PUMP
Freq: Three times a day (TID) | SUBCUTANEOUS | Status: DC
  Administered 2016-09-23 – 2016-09-25 (×9): via SUBCUTANEOUS
  Administered 2016-09-25: 2 via SUBCUTANEOUS
  Administered 2016-09-25: 12:00:00 via SUBCUTANEOUS
  Administered 2016-09-25: 1 via SUBCUTANEOUS
  Administered 2016-09-26 – 2016-09-27 (×6): via SUBCUTANEOUS
  Administered 2016-09-27: 1.4 via SUBCUTANEOUS
  Administered 2016-09-27: 0.6 via SUBCUTANEOUS
  Filled 2016-09-23: qty 1

## 2016-09-23 MED ORDER — METOCLOPRAMIDE HCL 5 MG/ML IJ SOLN: 5.0000 mg | Freq: Three times a day (TID) | INTRAMUSCULAR | Status: DC

## 2016-09-23 MED ORDER — METOCLOPRAMIDE HCL 5 MG/ML IJ SOLN
10.0000 mg | Freq: Three times a day (TID) | INTRAMUSCULAR | Status: DC
  Administered 2016-09-23 – 2016-09-24 (×4): 10 mg via INTRAVENOUS
  Filled 2016-09-23 (×4): qty 2

## 2016-09-23 MED ORDER — ONDANSETRON HCL 4 MG/2ML IJ SOLN: 4.0000 mg | Freq: Four times a day (QID) | INTRAMUSCULAR | Status: DC | PRN

## 2016-09-23 MED ORDER — PROMETHAZINE HCL 25 MG/ML IJ SOLN
25.0000 mg | Freq: Four times a day (QID) | INTRAMUSCULAR | Status: DC | PRN
  Administered 2016-09-23 – 2016-09-26 (×7): 25 mg via INTRAVENOUS
  Filled 2016-09-23 (×7): qty 1

## 2016-09-23 MED ORDER — PANTOPRAZOLE SODIUM 40 MG IV SOLR
40.0000 mg | Freq: Two times a day (BID) | INTRAVENOUS | Status: DC
  Administered 2016-09-23 – 2016-09-25 (×6): 40 mg via INTRAVENOUS
  Filled 2016-09-23 (×6): qty 40

## 2016-09-23 MED ORDER — POTASSIUM CHLORIDE 10 MEQ/100ML IV SOLN
10.0000 meq | INTRAVENOUS | Status: AC
  Administered 2016-09-23 (×6): 10 meq via INTRAVENOUS
  Filled 2016-09-23 (×2): qty 100

## 2016-09-23 MED ORDER — MORPHINE SULFATE (PF) 2 MG/ML IV SOLN
1.0000 mg | INTRAVENOUS | Status: DC | PRN
  Administered 2016-09-23 – 2016-09-25 (×3): 1 mg via INTRAVENOUS
  Filled 2016-09-23 (×3): qty 1

## 2016-09-23 MED ORDER — SODIUM CHLORIDE 0.9 % IV SOLN: 500.0000 mg | Freq: Four times a day (QID) | INTRAVENOUS | Status: DC

## 2016-09-23 NOTE — Consult Note (Signed)
[facility] Consult  Referring Provider: Theodis Blaze, MD Primary Care Physician:  Patient, No Pcp Per Primary Gastroenterologist: None  Reason for Consultation:  Intractable nausea, vomiting and retching  HPI: Barry Pittman is a 31 y.o. Caucasian male admitted on 09/21/2016 with vomiting and elevated blood sugar. He is currently in ICU being treated for DKA.  History is obtained from the patient as well as his wife at bedside.  Patient has had diabetes since late childhood, with multiple admissions for DKA. In February of this year he had DKA which was attributed to viral gastroenteritis. His wife states that he was switched from Humalog to NovoLog about a month ago, and has not achieved good blood sugar control. Blood sugar can fluctuate from as low as 70s to 400s. For the last 2 days, patient has been very nauseous, with multiple episodes of vomiting. He describes vomiting clear fluid to dark green bilious fluid, every 15-20 minutes, which has not been controlled despite use of anti-antiemetics. Patient also complains of soreness in his chest, from retching and vomiting. No prior EGD. Denies history of difficulty swallowing or pain on swallowing. Complains of heartburn and acid reflux. He has lost over 30 pounds in the past 1 month.   Past Medical History:  Diagnosis Date  . Diabetes mellitus without complication ([place])   . Hypertension     Past Surgical History:  Procedure Laterality Date  . EYE SURGERY     For retinopathy     Prior to Admission medications   Medication Sig Start Date End Date Taking? Authorizing Provider  insulin aspart (NOVOLOG) 100 UNIT/ML injection Inject 86 units into the skin over 24 hours via pump 06/21/16  Yes Philemon Kingdom, MD  lisinopril (PRINIVIL,ZESTRIL) 20 MG tablet Take 1 tablet (20 mg total) by mouth daily. 06/20/16  Yes Philemon Kingdom, MD  Multiple Vitamins-Minerals (MULTIVITAMIN ADULTS PO) Take 1 tablet by mouth daily.    Yes [provider]  glucagon (GLUCAGON EMERGENCY) 1 MG injection Inject 1 mg into the muscle once as needed. Patient taking differently: Inject 1 mg into the muscle daily as needed (low blood sugar).  06/20/16   Philemon Kingdom, MD  glucose blood (FREESTYLE LITE) test strip Use as instructed to check sugar 4 times daily 06/20/16   Philemon Kingdom, MD    Current Facility-Administered Medications  Medication Dose Route Frequency Provider Last Rate Last Dose  . 0.9 %  sodium chloride infusion   Intravenous Continuous Rise Patience, MD   Stopped at 09/22/16 0425  . aspirin EC tablet 81 mg  81 mg Oral Daily Rise Patience, MD   81 mg at 09/22/16 0656  . dextrose 5 %-0.45 % sodium chloride infusion   Intravenous Continuous Rise Patience, MD 125 mL/hr at 09/22/16 2100    . doxycycline (VIBRAMYCIN) 100 mg in dextrose 5 % 250 mL IVPB  100 mg Intravenous BID Rise Patience, MD   Stopped at 09/23/16 1116  . enoxaparin (LOVENOX) injection 40 mg  40 mg Subcutaneous QHS Rise Patience, MD   40 mg at 09/22/16 2158  . hydrALAZINE (APRESOLINE) injection 10 mg  10 mg Intravenous Q4H PRN Rise Patience, MD   10 mg at 09/22/16 0125  . insulin pump   Subcutaneous TID AC, HS, 0200 Doyle Askew, Iskra M, MD      . insulin regular (NOVOLIN R,HUMULIN R) 100 Units in sodium chloride 0.9 % 100 mL (1 Units/mL) infusion   Intravenous Continuous Gean Birchwood  N, MD 7.1 mL/hr at 09/23/16 1228 7.1 Units/hr at 09/23/16 1228  . metoCLOPramide (REGLAN) injection 10 mg  10 mg Intravenous Q8H Ronnette Juniper, MD      . pantoprazole (PROTONIX) injection 40 mg  40 mg Intravenous Q12H Mart Piggs M, MD      . potassium chloride 10 mEq in 100 mL IVPB  10 mEq Intravenous Q1 Hr x 6 Theodis Blaze, MD      . promethazine (PHENERGAN) injection 25 mg  25 mg Intravenous Q6H PRN Ronnette Juniper, MD        Allergies as of 09/21/2016  . (No Known Allergies)    Family History  Problem Relation Age of  Onset  . Diabetes Mother   . Heart disease Mother   . Diabetes Father   . Heart disease Father     Social History   Social History  . Marital status: Single    Spouse name: N/A  . Number of children: N/A  . Years of education: N/A   Occupational History  . Not on file.   Social History Main Topics  . Smoking status: Never Smoker  . Smokeless tobacco: Never Used  . Alcohol use Yes  . Drug use: No  . Sexual activity: Yes   Other Topics Concern  . Not on file   Social History Narrative  . No narrative on file    Review of Systems: Positive ZOX:[id number], vomiting, chest pain, weight loss GI: Described in detail in HPI.    Gen:  involuntary weight loss,Denies any fever, chills, rigors, night sweats, anorexia, fatigue, weakness, malaise, and sleep disorder CV: chest pain,Denies  angina, palpitations, syncope, orthopnea, PND, peripheral edema, and claudication. Resp: Denies dyspnea, cough, sputum, wheezing, coughing up blood. GU : Denies urinary burning, blood in urine, urinary frequency, urinary hesitancy, nocturnal urination, and urinary incontinence. MS: Denies joint pain or swelling.  Denies muscle weakness, cramps, atrophy.  Derm: Nodular, pruritic, erythematous rash noted in his left lower leg, Denies , itching, oral ulcerations, unhealing ulcers.  Psych: Denies depression, anxiety, memory loss, suicidal ideation, hallucinations,  and confusion. Heme: Denies bruising, bleeding, and enlarged lymph nodes. Neuro:  Denies any headaches, dizziness, paresthesias. Endo:  Diabetes mellitus , multiple episodes of DKA Denies any problems with thyroid, adrenal function.  Physical Exam: Vital signs in last 24 hours: Temp:  [98.1 F (36.7 C)-99.1 F (37.3 C)] 98.7 F (37.1 C) (06/08 1200) Pulse Rate:  [97] 97 (06/07 1600) Resp:  [8-22] 20 (06/08 1200) BP: (106-177)/(54-90) 155/73 (06/08 1200) SpO2:  [94 %-100 %] 99 % (06/08 1200) Last BM Date: 09/22/16  General:   Alert,   Well-developed, well-nourished, in distress due to retching and nausea Head:  Normocephalic and atraumatic. Eyes:  Sclera clear, no icterus.   Conjunctiva pink. Ears:  Normal auditory acuity. Nose:  No deformity, discharge,  or lesions. Mouth:  No deformity or lesions.  Oropharynx pink & moist. Neck:  Supple; no masses or thyromegaly. Lungs:  Clear throughout to auscultation.   No wheezes, crackles, or rhonchi. No acute distress. Heart:  Regular rate and rhythm; no murmurs, clicks, rubs,  or gallops. Extremities:  Without clubbing or edema. Neurologic:  Alert and  oriented x4;  grossly normal neurologically. Skin: Nodular, erythematous, pruritic, rashes noted in left lower leg . Psych:  Alert and cooperative. Normal mood and affect. Abdomen:  Soft, nontender and nondistended. No masses, hepatosplenomegaly or hernias noted. Normal bowel sounds, without guarding, and without rebound.  Lab Results:  Recent Labs  09/21/16 2121 09/23/16 0307  WBC 13.1* 11.6*  HGB 17.8* 14.9  HCT 51.1 42.6  PLT 313 260   BMET  Recent Labs  09/23/16 0307 09/23/16 0743 09/23/16 1128  NA 137 139 137  K 3.3* 3.5 3.0*  CL 111 108 107  CO2 19* 21* 19*  GLUCOSE 183* 170* 197*  BUN 13 12 12   CREATININE 0.84 0.83 0.82  CALCIUM 8.9 9.3 9.1   LFT  Recent Labs  09/21/16 2121  PROT 8.7*  ALBUMIN 4.9  AST 17  ALT 14*  ALKPHOS 98  BILITOT 1.9*   PT/INR No results for input(s): LABPROT, INR in the last 72 hours.  Studies/Results: Dg Chest 2 View  Result Date: 09/21/2016 CLINICAL DATA:  Generalized chest pain with shortness of breath EXAM: CHEST  2 VIEW COMPARISON:  05/26/2015 FINDINGS: The heart size and mediastinal contours are within normal limits. Both lungs are clear. The visualized skeletal structures are unremarkable. IMPRESSION: No active cardiopulmonary disease. Electronically Signed   By: Donavan Foil M.D.   On: 09/21/2016 21:50   Ct Chest W Contrast  Result Date:  09/22/2016 CLINICAL DATA:  Nausea and vomiting.  Loss of weight. EXAM: CT CHEST, ABDOMEN, AND PELVIS WITH CONTRAST TECHNIQUE: Multidetector CT imaging of the chest, abdomen and pelvis was performed following the standard protocol during bolus administration of intravenous contrast. CONTRAST:  156mL ISOVUE-300 IOPAMIDOL (ISOVUE-300) INJECTION 61% COMPARISON:  None. FINDINGS: CT CHEST FINDINGS Cardiovascular: No significant vascular findings. Normal heart size. No pericardial effusion. Mediastinum/Nodes: No enlarged mediastinal, hilar, or axillary lymph nodes. Thyroid gland, trachea, and esophagus demonstrate no significant findings. Lungs/Pleura: No pleural effusion. Musculoskeletal: No chest wall mass or suspicious bone lesions identified. CT ABDOMEN PELVIS FINDINGS Hepatobiliary: No focal liver abnormality is seen. No gallstones, gallbladder wall thickening, or biliary dilatation. Pancreas: Unremarkable. No pancreatic ductal dilatation or surrounding inflammatory changes. Spleen: Normal in size without focal abnormality. Adrenals/Urinary Tract: Adrenal glands are unremarkable. Kidneys are normal, without renal calculi, focal lesion, or hydronephrosis. Bladder is unremarkable. Stomach/Bowel: Stomach is within normal limits. Appendix appears normal. No evidence of bowel wall thickening, distention, or inflammatory changes. Vascular/Lymphatic: No significant vascular findings are present. No enlarged abdominal or pelvic lymph nodes. Reproductive: Prostate is unremarkable. Other: No abdominal wall hernia or abnormality. No abdominopelvic ascites. Musculoskeletal: No acute or significant osseous findings. IMPRESSION: 1. There is no mass or adenopathy identified within the chest, abdomen or pelvis. 2. No acute findings identified. Electronically Signed   By: Kerby Moors M.D.   On: 09/22/2016 16:00   Ct Abdomen Pelvis W Contrast  Result Date: 09/22/2016 CLINICAL DATA:  Nausea and vomiting.  Loss of weight. EXAM: CT  CHEST, ABDOMEN, AND PELVIS WITH CONTRAST TECHNIQUE: Multidetector CT imaging of the chest, abdomen and pelvis was performed following the standard protocol during bolus administration of intravenous contrast. CONTRAST:  195mL ISOVUE-300 IOPAMIDOL (ISOVUE-300) INJECTION 61% COMPARISON:  None. FINDINGS: CT CHEST FINDINGS Cardiovascular: No significant vascular findings. Normal heart size. No pericardial effusion. Mediastinum/Nodes: No enlarged mediastinal, hilar, or axillary lymph nodes. Thyroid gland, trachea, and esophagus demonstrate no significant findings. Lungs/Pleura: No pleural effusion. Musculoskeletal: No chest wall mass or suspicious bone lesions identified. CT ABDOMEN PELVIS FINDINGS Hepatobiliary: No focal liver abnormality is seen. No gallstones, gallbladder wall thickening, or biliary dilatation. Pancreas: Unremarkable. No pancreatic ductal dilatation or surrounding inflammatory changes. Spleen: Normal in size without focal abnormality. Adrenals/Urinary Tract: Adrenal glands are unremarkable. Kidneys are normal, without renal calculi, focal lesion,  or hydronephrosis. Bladder is unremarkable. Stomach/Bowel: Stomach is within normal limits. Appendix appears normal. No evidence of bowel wall thickening, distention, or inflammatory changes. Vascular/Lymphatic: No significant vascular findings are present. No enlarged abdominal or pelvic lymph nodes. Reproductive: Prostate is unremarkable. Other: No abdominal wall hernia or abnormality. No abdominopelvic ascites. Musculoskeletal: No acute or significant osseous findings. IMPRESSION: 1. There is no mass or adenopathy identified within the chest, abdomen or pelvis. 2. No acute findings identified. Electronically Signed   By: Kerby Moors M.D.   On: 09/22/2016 16:00   Dg Abd 2 Views  Result Date: 09/23/2016 CLINICAL DATA:  Nausea, vomiting, abdominal distention EXAM: ABDOMEN - 2 VIEW COMPARISON:  CT 09/22/2016 FINDINGS: The bowel gas pattern is normal.  There is no evidence of free air. No radio-opaque calculi or other significant radiographic abnormality is seen. IMPRESSION: Negative. Electronically Signed   By: Rolm Baptise M.D.   On: 09/23/2016 12:17    Impression: 1. Admitted for DKA(Bicarb improved from 7 on admission to 19, normal renal function, recent blood sugar 164) 2. Intractable nausea, vomiting: Gastroparesis versus related to underlying DKA 3. Acid reflux, heartburn  Plan: Abdominal x-ray unremarkable, nausea vomiting not related to food. CAT scan showed normal stomach and small bowel-no evidence of obstruction. Receiving normal saline at 150 mL per hour, continued on insulin pump.  Increase dosage of antiemetic A.Reglan 10 mg IV every 8 hours for the next 24 hours around the clock B. Increase Phenergan to 25 mg IV every 6 hours around the clock for 24 hours then may switch to when necessary C. If erythromycin is available, recommend 500 mg every 8 hours for 24 hours. D. Zofran 4 mg IV every 6 hours when necessary  If there is no improvement noted in 24 hours, maximal antiemetic therapy may need to consider EGD      LOS: 2 days   Gari Crown  09/23/2016, 1:11 PM  Pager [phone] If no answer or after 5 PM call [phone]

## 2016-09-23 NOTE — Progress Notes (Signed)
PROGRESS NOTE  Barry Pittman  BJS:[id number] DOB: 1986/04/06 DOA: 09/21/2016  PCP: Patient, No Pcp Per   Outpatient Specialists: Dr. Cruzita Lederer, endocrinologist   Brief Narrative:  Patient is 31 year old male with known diabetes, follows with endocrinologist. Presented to [facility] emergency department with several days duration of progressively worsening nausea and poor oral intake, progressed to persistent nonbloody vomiting and unable to keep anything down. On admission, noted to be in DKA and [facility] asked to admit for further evaluation.  Assessment & Plan:   Principal Problem:   DKA (diabetic ketoacidoses) ([place]) - In the setting of persistent nausea and vomiting, inability to tolerate by mouth intake - Unclear what triggering event this is - Spoke with patient's endocrinologist, we will list NovoLog as patient's intolerance - Use only Humalog with insulin pump - We can consider insulin R if Humalog is not working  Active Problems:   Intractable nausea and vomiting, hypokalemia  - Unclear etiology, consulted with GI - CT abdomen pelvis with no specific acute etiology that would explain nausea and vomiting - X-ray of the abdomen also with no signs of obstruction or free air - supplement K via IV route     Chest pain - Suspect this is related to the above, questionable as esophagitis versus gastritis - Follow-up on gastroenterologist recommendations   DVT prophylaxis: Lovenox Code Status: Full Family Communication: Patient and wife at bedside  Disposition Plan: To be determined  Consultants:   Endocrinologist over the phone, Dr. Cruzita Lederer  GI  Procedures:   None  Antimicrobials:   None   Subjective: Patient reports persistent nausea and vomiting, unable to keep liquids or solids down.  Objective: Vitals:   09/23/16 0800 09/23/16 1000 09/23/16 1200 09/23/16 1400  BP: 123/64 (!) 150/77 (!) 155/73 (!) 128/55  Pulse:      Resp: (!) 9 11 20  (!) 24  Temp:  98.1 F (36.7 C)  98.7 F (37.1 C)   TempSrc: Oral  Oral   SpO2: 96% 100% 99% 98%  Weight:      Height:        Intake/Output Summary (Last 24 hours) at 09/23/16 1556 Last data filed at 09/23/16 1500  Gross per 24 hour  Intake          3312.61 ml  Output             1200 ml  Net          2112.61 ml   Filed Weights   09/21/16 2103  Weight: 106.6 kg (235 lb)    Examination:  General exam: Appears calm and comfortable  Respiratory system: Clear to auscultation. Respiratory effort normal. Cardiovascular system: S1 & S2 heard, RRR. No JVD, murmurs, rubs, gallops or clicks. No pedal edema. Gastrointestinal system: Abdomen is nondistended, tender in epigastric area Central nervous system: Alert and oriented. No focal neurological deficits. Extremities: Symmetric 5 x 5 power. Skin: No rashes, lesions or ulcers Psychiatry: Judgement and insight appear normal. Mood & affect appropriate.    Data Reviewed: I have personally reviewed following labs and imaging studies  CBC:  Recent Labs Lab 09/21/16 2121 09/23/16 0307  WBC 13.1* 11.6*  HGB 17.8* 14.9  HCT 51.1 42.6  MCV 84.2 84.0  PLT 313 607   Basic Metabolic Panel:  Recent Labs Lab 09/22/16 1700 09/22/16 2356 09/23/16 0307 09/23/16 0743 09/23/16 1128  NA 138 138 137 139 137  K 3.6 3.5 3.3* 3.5 3.0*  CL 112* 112* 111 108 107  CO2 16* 18* 19* 21* 19*  GLUCOSE 153* 183* 183* 170* 197*  BUN 13 14 13 12 12   CREATININE 0.87 0.79 0.84 0.83 0.82  CALCIUM 9.2 9.0 8.9 9.3 9.1   Liver Function Tests:  Recent Labs Lab 09/21/16 2121  AST 17  ALT 14*  ALKPHOS 98  BILITOT 1.9*  PROT 8.7*  ALBUMIN 4.9    Recent Labs Lab 09/21/16 2121  LIPASE 16   Cardiac Enzymes:  Recent Labs Lab 09/22/16 0135 09/22/16 0412 09/22/16 1200  TROPONINI <0.03 <0.03 <0.03   CBG:  Recent Labs Lab 09/23/16 1009 09/23/16 1121 09/23/16 1226 09/23/16 1337 09/23/16 1442  GLUCAP 163* 198* 164* 185* 175*   Urine  analysis:    Component Value Date/Time   COLORURINE STRAW (A) 09/21/2016 2126   APPEARANCEUR CLEAR 09/21/2016 2126   LABSPEC 1.032 (H) 09/21/2016 2126   PHURINE 5.0 09/21/2016 2126   GLUCOSEU >=500 (A) 09/21/2016 2126   HGBUR MODERATE (A) 09/21/2016 2126   BILIRUBINUR NEGATIVE 09/21/2016 2126   KETONESUR 80 (A) 09/21/2016 2126   PROTEINUR 30 (A) 09/21/2016 2126   NITRITE NEGATIVE 09/21/2016 2126   LEUKOCYTESUR NEGATIVE 09/21/2016 2126    Recent Results (from the past 240 hour(s))  MRSA PCR Screening     Status: None   Collection Time: 09/22/16  1:22 AM  Result Value Ref Range Status   MRSA by PCR NEGATIVE NEGATIVE Final    Comment:        The GeneXpert MRSA Assay (FDA approved for NASAL specimens only), is one component of a comprehensive MRSA colonization surveillance program. It is not intended to diagnose MRSA infection nor to guide or monitor treatment for MRSA infections.     Radiology Studies: Dg Chest 2 View  Result Date: 09/21/2016 CLINICAL DATA:  Generalized chest pain with shortness of breath EXAM: CHEST  2 VIEW COMPARISON:  05/26/2015 FINDINGS: The heart size and mediastinal contours are within normal limits. Both lungs are clear. The visualized skeletal structures are unremarkable. IMPRESSION: No active cardiopulmonary disease. Electronically Signed   By: Donavan Foil M.D.   On: 09/21/2016 21:50   Ct Chest W Contrast  Result Date: 09/22/2016 CLINICAL DATA:  Nausea and vomiting.  Loss of weight. EXAM: CT CHEST, ABDOMEN, AND PELVIS WITH CONTRAST TECHNIQUE: Multidetector CT imaging of the chest, abdomen and pelvis was performed following the standard protocol during bolus administration of intravenous contrast. CONTRAST:  155mL ISOVUE-300 IOPAMIDOL (ISOVUE-300) INJECTION 61% COMPARISON:  None. FINDINGS: CT CHEST FINDINGS Cardiovascular: No significant vascular findings. Normal heart size. No pericardial effusion. Mediastinum/Nodes: No enlarged mediastinal, hilar, or  axillary lymph nodes. Thyroid gland, trachea, and esophagus demonstrate no significant findings. Lungs/Pleura: No pleural effusion. Musculoskeletal: No chest wall mass or suspicious bone lesions identified. CT ABDOMEN PELVIS FINDINGS Hepatobiliary: No focal liver abnormality is seen. No gallstones, gallbladder wall thickening, or biliary dilatation. Pancreas: Unremarkable. No pancreatic ductal dilatation or surrounding inflammatory changes. Spleen: Normal in size without focal abnormality. Adrenals/Urinary Tract: Adrenal glands are unremarkable. Kidneys are normal, without renal calculi, focal lesion, or hydronephrosis. Bladder is unremarkable. Stomach/Bowel: Stomach is within normal limits. Appendix appears normal. No evidence of bowel wall thickening, distention, or inflammatory changes. Vascular/Lymphatic: No significant vascular findings are present. No enlarged abdominal or pelvic lymph nodes. Reproductive: Prostate is unremarkable. Other: No abdominal wall hernia or abnormality. No abdominopelvic ascites. Musculoskeletal: No acute or significant osseous findings. IMPRESSION: 1. There is no mass or adenopathy identified within the chest, abdomen or pelvis. 2. No acute findings identified.  Electronically Signed   By: Kerby Moors M.D.   On: 09/22/2016 16:00   Ct Abdomen Pelvis W Contrast  Result Date: 09/22/2016 CLINICAL DATA:  Nausea and vomiting.  Loss of weight. EXAM: CT CHEST, ABDOMEN, AND PELVIS WITH CONTRAST TECHNIQUE: Multidetector CT imaging of the chest, abdomen and pelvis was performed following the standard protocol during bolus administration of intravenous contrast. CONTRAST:  138mL ISOVUE-300 IOPAMIDOL (ISOVUE-300) INJECTION 61% COMPARISON:  None. FINDINGS: CT CHEST FINDINGS Cardiovascular: No significant vascular findings. Normal heart size. No pericardial effusion. Mediastinum/Nodes: No enlarged mediastinal, hilar, or axillary lymph nodes. Thyroid gland, trachea, and esophagus demonstrate no  significant findings. Lungs/Pleura: No pleural effusion. Musculoskeletal: No chest wall mass or suspicious bone lesions identified. CT ABDOMEN PELVIS FINDINGS Hepatobiliary: No focal liver abnormality is seen. No gallstones, gallbladder wall thickening, or biliary dilatation. Pancreas: Unremarkable. No pancreatic ductal dilatation or surrounding inflammatory changes. Spleen: Normal in size without focal abnormality. Adrenals/Urinary Tract: Adrenal glands are unremarkable. Kidneys are normal, without renal calculi, focal lesion, or hydronephrosis. Bladder is unremarkable. Stomach/Bowel: Stomach is within normal limits. Appendix appears normal. No evidence of bowel wall thickening, distention, or inflammatory changes. Vascular/Lymphatic: No significant vascular findings are present. No enlarged abdominal or pelvic lymph nodes. Reproductive: Prostate is unremarkable. Other: No abdominal wall hernia or abnormality. No abdominopelvic ascites. Musculoskeletal: No acute or significant osseous findings. IMPRESSION: 1. There is no mass or adenopathy identified within the chest, abdomen or pelvis. 2. No acute findings identified. Electronically Signed   By: Kerby Moors M.D.   On: 09/22/2016 16:00   Dg Abd 2 Views  Result Date: 09/23/2016 CLINICAL DATA:  Nausea, vomiting, abdominal distention EXAM: ABDOMEN - 2 VIEW COMPARISON:  CT 09/22/2016 FINDINGS: The bowel gas pattern is normal. There is no evidence of free air. No radio-opaque calculi or other significant radiographic abnormality is seen. IMPRESSION: Negative. Electronically Signed   By: Rolm Baptise M.D.   On: 09/23/2016 12:17    Scheduled Meds: . aspirin EC  81 mg Oral Daily  . enoxaparin (LOVENOX) injection  40 mg Subcutaneous QHS  . insulin pump   Subcutaneous TID AC, HS, 0200  . metoCLOPramide (REGLAN) injection  10 mg Intravenous Q8H  . pantoprazole (PROTONIX) IV  40 mg Intravenous Q12H   Continuous Infusions: . sodium chloride Stopped (09/22/16  0425)  . dextrose 5 % and 0.45% NaCl 125 mL/hr at 09/23/16 1339  . doxycycline (VIBRAMYCIN) IV Stopped (09/23/16 1116)  . insulin (NOVOLIN-R) infusion 9.1 Units/hr (09/23/16 1448)  . potassium chloride 10 mEq (09/23/16 1449)     LOS: 2 days   Time spent: 35 minutes   [name], MD [facility] Pager [phone]  If 7PM-7AM, please contact night-coverage [email or web address] Password [facility] 09/23/2016, 3:56 PM

## 2016-09-23 NOTE — Progress Notes (Signed)
Pt urine is orange/red.  Informed Dr. Doyle Askew.  Irven Baltimore, RN

## 2016-09-23 NOTE — Progress Notes (Signed)
Inpatient Diabetes Program Recommendations  AACE/ADA: New Consensus Statement on Inpatient Glycemic Control (2015)  Target Ranges:  Prepandial:   less than 140 mg/dL      Peak postprandial:   less than 180 mg/dL (1-2 hours)      Critically ill patients:  140 - 180 mg/dL   Lab Results  Component Value Date   GLUCAP 198 (H) 09/23/2016   HGBA1C 10.6 06/21/2016    Review of Glycemic Control  Still not feeling well,continues with nausea and vomiting. Continues with insulin drip. 9.4 units/hour. CO2 - 21 AG - 10. Has insulin pump and supplies. Has called [phone] number for quality check on pump.  Inpatient Diabetes Program Recommendations:    When ready to transition to pump, start pump 2 hours prior to discontinuing pump.  Needs immediate f/u with endo regarding Novolog being changed back to Humalog.  Spoke with pt and wife this am.  Thank you. Lorenda Peck, RD, LDN, CDE Inpatient Diabetes Coordinator [phone]

## 2016-09-24 LAB — GLUCOSE, CAPILLARY
GLUCOSE-CAPILLARY: 179 mg/dL — AB (ref 65–99)
GLUCOSE-CAPILLARY: 228 mg/dL — AB (ref 65–99)
GLUCOSE-CAPILLARY: 197 mg/dL — AB (ref 65–99)
Glucose-Capillary: 171 mg/dL — ABNORMAL HIGH (ref 65–99)
Glucose-Capillary: 165 mg/dL — ABNORMAL HIGH (ref 65–99)

## 2016-09-24 MED ORDER — METOCLOPRAMIDE HCL 5 MG/ML IJ SOLN
10.0000 mg | Freq: Three times a day (TID) | INTRAMUSCULAR | Status: DC | PRN
  Administered 2016-09-25: 10 mg via INTRAVENOUS
  Filled 2016-09-24: qty 2

## 2016-09-24 MED ORDER — DEXTROSE 5 % IV SOLN
1.0000 g | Freq: Every day | INTRAVENOUS | Status: DC
  Administered 2016-09-24 – 2016-09-27 (×4): 1 g via INTRAVENOUS
  Filled 2016-09-24 (×4): qty 10

## 2016-09-24 MED ORDER — MAGIC MOUTHWASH
15.0000 mL | Freq: Four times a day (QID) | ORAL | Status: DC
  Administered 2016-09-24 – 2016-09-27 (×9): 15 mL via ORAL
  Filled 2016-09-24 (×15): qty 15

## 2016-09-24 MED ORDER — POTASSIUM CHLORIDE IN NACL 40-0.9 MEQ/L-% IV SOLN
INTRAVENOUS | Status: DC
  Administered 2016-09-24 – 2016-09-26 (×4): 75 mL/h via INTRAVENOUS
  Filled 2016-09-24 (×6): qty 1000

## 2016-09-24 NOTE — Progress Notes (Signed)
Notified MD Doyle Askew in regards to patient possibly transferring. Vital signs have been stable. Patient's nausea has been controlled with PRN medications. Patient also has been off insulin gtt and is back on home insulin pump. Will place verbal transfer order.

## 2016-09-24 NOTE — Progress Notes (Addendum)
PROGRESS NOTE  TRACER [name]  TGG:[id number] DOB: [birth year] DOA: 09/21/2016  PCP: Barry Pittman, No Pcp Per   Outpatient Specialists: Dr. Cruzita Lederer, endocrinologist   Brief Narrative:  Barry Pittman is 31 year old male with known diabetes, follows with endocrinologist. Presented to [facility] emergency department with several days duration of progressively worsening nausea and poor oral intake, progressed to persistent nonbloody vomiting and unable to keep anything down. On admission, noted to be in DKA and TRH asked to admit for further evaluation.  Assessment & Plan:   Principal Problem:   DKA (diabetic ketoacidoses) ([place]) - In the setting of persistent nausea and vomiting, inability to tolerate by mouth intake - Unclear what triggering event this is - keep on insulin humalog, pump - repeat BMP in AM   Active Problems:   Intractable nausea and vomiting, hypokalemia  - Unclear etiology, consulted with GI - CT abdomen pelvis with no specific acute etiology that would explain nausea and vomiting - X-ray of the abdomen also with no signs of obstruction or free air - somewhat better this AM, GI team with no further recommendations - continue to supplement K - continue supportive care    UTI  - started on Rocephin 6/9    Chest pain - Suspect this is related to the above, questionable as esophagitis versus gastritis - resolved    DVT prophylaxis: Lovenox Code Status: Full Family Communication: family and pt at bedside  Disposition Plan: likely home in 1-2 days   Consultants:   Endocrinologist over the phone, Dr. Cruzita Lederer  GI  Procedures:   None  Antimicrobials:   Rocephin 6/9 -->   Subjective: Pt reports somewhat better but still with intermittent nausea.   Objective: Vitals:   09/24/16 1107 09/24/16 1200 09/24/16 1400 09/24/16 1600  BP: (!) 163/93 (!) 163/90 (!) 160/86 (!) 145/80  Pulse:      Resp: 13 11 (!) 8 (!) 25  Temp:  98.3 F (36.8 C)  98.5 F (36.9 C)   TempSrc:  Oral  Oral  SpO2: 98% 98% 99% 95%  Weight:      Height:        Intake/Output Summary (Last 24 hours) at 09/24/16 1853 Last data filed at 09/24/16 0400  Gross per 24 hour  Intake             1100 ml  Output              450 ml  Net              650 ml   Filed Weights   09/21/16 2103  Weight: 106.6 kg (235 lb)   Physical Exam  Constitutional: Appears well-developed and well-nourished. No distress.  CVS: RRR, S1/S2 +, no murmurs, no gallops, no carotid bruit.  Pulmonary: Effort and breath sounds normal, no stridor, rhonchi, wheezes, rales.  Abdominal: Soft. BS +,  no distension, tenderness, rebound or guarding.  Musculoskeletal: Normal range of motion. No edema and no tenderness.   Data Reviewed: I have personally reviewed following labs and imaging studies  CBC:  Recent Labs Lab 09/21/16 2121 09/23/16 0307  WBC 13.1* 11.6*  HGB 17.8* 14.9  HCT 51.1 42.6  MCV 84.2 84.0  PLT 313 703   Basic Metabolic Panel:  Recent Labs Lab 09/23/16 0307 09/23/16 0743 09/23/16 1128 09/23/16 1534 09/23/16 1918  NA 137 139 137 136 137  K 3.3* 3.5 3.0* 3.1* 3.1*  CL 111 108 107 107 106  CO2 19* 21* 19* 21* 18*  GLUCOSE 183* 170* 197* 168* 246*  BUN 13 12 12 11 10   CREATININE 0.84 0.83 0.82 0.78 0.78  CALCIUM 8.9 9.3 9.1 8.7* 8.8*   Liver Function Tests:  Recent Labs Lab 09/21/16 2121  AST 17  ALT 14*  ALKPHOS 98  BILITOT 1.9*  PROT 8.7*  ALBUMIN 4.9    Recent Labs Lab 09/21/16 2121  LIPASE 16   Cardiac Enzymes:  Recent Labs Lab 09/22/16 0135 09/22/16 0412 09/22/16 1200  TROPONINI <0.03 <0.03 <0.03   CBG:  Recent Labs Lab 09/23/16 2219 09/24/16 0233 09/24/16 0740 09/24/16 1217 09/24/16 1646  GLUCAP 213* 179* 228* 197* 171*   Urine analysis:    Component Value Date/Time   COLORURINE YELLOW 09/23/2016 1631   APPEARANCEUR TURBID (A) 09/23/2016 1631   LABSPEC 1.025 09/23/2016 1631   PHURINE 6.0 09/23/2016 1631   GLUCOSEU >=500 (A)  09/23/2016 1631   HGBUR LARGE (A) 09/23/2016 1631   BILIRUBINUR NEGATIVE 09/23/2016 1631   KETONESUR 20 (A) 09/23/2016 1631   PROTEINUR 100 (A) 09/23/2016 1631   NITRITE NEGATIVE 09/23/2016 1631   LEUKOCYTESUR NEGATIVE 09/23/2016 1631    Recent Results (from the past 240 hour(s))  MRSA PCR Screening     Status: None   Collection Time: 09/22/16  1:22 AM  Result Value Ref Range Status   MRSA by PCR NEGATIVE NEGATIVE Final    Comment:        The GeneXpert MRSA Assay (FDA approved for NASAL specimens only), is one component of a comprehensive MRSA colonization surveillance program. It is not intended to diagnose MRSA infection nor to guide or monitor treatment for MRSA infections.     Radiology Studies: Dg Abd 2 Views  Result Date: 09/23/2016 CLINICAL DATA:  Nausea, vomiting, abdominal distention EXAM: ABDOMEN - 2 VIEW COMPARISON:  CT 09/22/2016 FINDINGS: The bowel gas pattern is normal. There is no evidence of free air. No radio-opaque calculi or other significant radiographic abnormality is seen. IMPRESSION: Negative. Electronically Signed   By: Rolm Baptise M.D.   On: 09/23/2016 12:17    Scheduled Meds: . aspirin EC  81 mg Oral Daily  . enoxaparin (LOVENOX) injection  40 mg Subcutaneous QHS  . insulin pump   Subcutaneous TID AC, HS, 0200  . magic mouthwash  15 mL Oral QID  . metoCLOPramide (REGLAN) injection  10 mg Intravenous Q8H  . pantoprazole (PROTONIX) IV  40 mg Intravenous Q12H   Continuous Infusions: . cefTRIAXone (ROCEPHIN)  IV Stopped (09/24/16 1308)  . dextrose 5 % and 0.45% NaCl Stopped (09/23/16 1815)  . insulin (NOVOLIN-R) infusion Stopped (09/23/16 1814)     LOS: 3 days   Time spent: 35 minutes   [name], MD [facility] Pager [phone]  If 7PM-7AM, please contact night-coverage [email or web address] Password [facility] 09/24/2016, 6:53 PM

## 2016-09-24 NOTE — Progress Notes (Signed)
[facility] Gastroenterology Progress Note  Barry Pittman 30 y.o. 09-06-1985  CC:  Nausea and vomiting.   Subjective: Patient's nausea is improving. No further vomiting since yesterday. Denied diarrhea or constipation. Denied any blood in the stool.   ROS : Negative for chest pain and shortness of breath   Objective: Vital signs in last 24 hours: Vitals:   09/24/16 1107 09/24/16 1200  BP: (!) 163/93 (!) 163/90  Pulse:    Resp: 13 11  Temp:      Physical Exam:  General:  Alert, cooperative, no distress, appears stated age  Head:  Normocephalic, without obvious abnormality, atraumatic  Eyes:  , EOM's intact,   Lungs:   Clear to auscultation bilaterally, respirations unlabored  Heart:  Regular rate and rhythm, S1, S2 normal  Abdomen:   Soft, non-tender, bowel sounds active all four quadrants,  no masses,   Extremities: Extremities normal, atraumatic, no  edema  Pulses: 2+ and symmetric    Lab Results:  Recent Labs  09/23/16 1534 09/23/16 1918  NA 136 137  K 3.1* 3.1*  CL 107 106  CO2 21* 18*  GLUCOSE 168* 246*  BUN 11 10  CREATININE 0.78 0.78  CALCIUM 8.7* 8.8*    Recent Labs  09/21/16 2121  AST 17  ALT 14*  ALKPHOS 98  BILITOT 1.9*  PROT 8.7*  ALBUMIN 4.9    Recent Labs  09/21/16 2121 09/23/16 0307  WBC 13.1* 11.6*  HGB 17.8* 14.9  HCT 51.1 42.6  MCV 84.2 84.0  PLT 313 260   No results for input(s): LABPROT, INR in the last 72 hours.    Assessment/Plan: - Nausea and vomiting. Improving. Most likely from underlying DKA. - Acid reflux. Improving  Recommendations ------------------------- - Slowly advance diet as tolerated.  - Recommend against using Reglan for long-term. Continue Protonix. Continue as needed antiemetics. - No further inpatient GI workup planned. GI will sign off. Call us back if needed. - Follow-up in GI clinic in 4-6 week if clinically indicated  Otis Brace MD, [place] 09/24/2016, 2:20 PM  Pager [phone]  If  no answer or after 5 PM call [phone]

## 2016-09-25 LAB — GLUCOSE, CAPILLARY
GLUCOSE-CAPILLARY: 161 mg/dL — AB (ref 65–99)
Glucose-Capillary: 176 mg/dL — ABNORMAL HIGH (ref 65–99)
Glucose-Capillary: 197 mg/dL — ABNORMAL HIGH (ref 65–99)
Glucose-Capillary: 154 mg/dL — ABNORMAL HIGH (ref 65–99)
Glucose-Capillary: 188 mg/dL — ABNORMAL HIGH (ref 65–99)

## 2016-09-25 LAB — URINE CULTURE: CULTURE: NO GROWTH

## 2016-09-25 LAB — BASIC METABOLIC PANEL
Anion gap: 11 (ref 5–15)
BUN: 10 mg/dL (ref 6–20)
CALCIUM: 8.6 mg/dL — AB (ref 8.9–10.3)
CHLORIDE: 104 mmol/L (ref 101–111)
CO2: 24 mmol/L (ref 22–32)
CREATININE: 0.63 mg/dL (ref 0.61–1.24)
Glucose, Bld: 180 mg/dL — ABNORMAL HIGH (ref 65–99)
Potassium: 3 mmol/L — ABNORMAL LOW (ref 3.5–5.1)
SODIUM: 139 mmol/L (ref 135–145)

## 2016-09-25 LAB — CBC
HCT: 39.6 % (ref 39.0–52.0)
HEMOGLOBIN: 13.8 g/dL (ref 13.0–17.0)
MCH: 28.9 pg (ref 26.0–34.0)
MCHC: 34.8 g/dL (ref 30.0–36.0)
MCV: 82.8 fL (ref 78.0–100.0)
PLATELETS: 241 10*3/uL (ref 150–400)
RBC: 4.78 MIL/uL (ref 4.22–5.81)
RDW: 12.5 % (ref 11.5–15.5)
WBC: 6.7 10*3/uL (ref 4.0–10.5)

## 2016-09-25 LAB — TROPONIN I: Troponin I: <0.03 ng/mL (ref <0.03)

## 2016-09-25 MED ORDER — ACETAMINOPHEN 325 MG PO TABS
650.0000 mg | ORAL_TABLET | Freq: Four times a day (QID) | ORAL | Status: DC | PRN
  Administered 2016-09-25: 650 mg via ORAL
  Filled 2016-09-25: qty 2

## 2016-09-25 MED ORDER — ACETAMINOPHEN 325 MG PO TABS: 325.0000 mg | ORAL_TABLET | Freq: Two times a day (BID) | ORAL | Status: DC | PRN

## 2016-09-25 MED ORDER — ZOLPIDEM TARTRATE 10 MG PO TABS
10.0000 mg | ORAL_TABLET | Freq: Every evening | ORAL | Status: AC | PRN
  Administered 2016-09-25: 10 mg via ORAL
  Filled 2016-09-25: qty 1

## 2016-09-25 MED ORDER — OXYCODONE-ACETAMINOPHEN 5-325 MG PO TABS
1.0000 | ORAL_TABLET | ORAL | Status: DC | PRN
  Administered 2016-09-25: 2 via ORAL
  Filled 2016-09-25: qty 2

## 2016-09-25 MED ORDER — POTASSIUM CHLORIDE CRYS ER 20 MEQ PO TBCR
40.0000 meq | EXTENDED_RELEASE_TABLET | Freq: Once | ORAL | Status: AC
  Administered 2016-09-25: 40 meq via ORAL
  Filled 2016-09-25: qty 2

## 2016-09-25 NOTE — Progress Notes (Addendum)
PROGRESS NOTE  Barry Pittman  IDP:[id number] DOB: 04-Feb-1986 DOA: 09/21/2016  PCP: Patient, No Pcp Per   Outpatient Specialists: Dr. Cruzita Lederer, endocrinologist   Brief Narrative:  Patient is 31 year old male with known diabetes, follows with endocrinologist. Presented to [facility] emergency department with several days duration of progressively worsening nausea and poor oral intake, progressed to persistent nonbloody vomiting and unable to keep anything down. On admission, noted to be in DKA and TRH asked to admit for further evaluation.  Assessment & Plan:   Principal Problem:   DKA (diabetic ketoacidoses) ([place]) - In the setting of persistent nausea and vomiting, inability to tolerate by mouth intake - pt and his wife suspect this to be related to Novolog intolerance - pt is clinically improving - tolerating diet well so far with no vomiting - AG closed  Active Problems:   Intractable nausea and vomiting, hypokalemia  - Unclear etiology, consulted with GI - CT abdomen pelvis with no specific acute etiology that would explain nausea and vomiting - X-ray of the abdomen also with no signs of obstruction or free air - reports feeling better this am, tolerating diet well  - may be able to stop IVF in next 24 hours - supplement K orally, BMP in AM    UTI  - rocephin day #2, follow up on urine culture     Chest pain - Suspect this is related to the above, questionable as esophagitis versus gastritis - troponin negative - no need for tele   DVT prophylaxis: Lovenox Code Status: Full Family Communication: pt and wife at bedside  Disposition Plan: home in 1-2 days   Consultants:   Endocrinologist over the phone, Dr. Cruzita Lederer  GI  Procedures:   None  Antimicrobials:   Rocephin 6/9 -->  Subjective: Pt reports feeling better this am but still with poor oral intake. No vomiting this AM.  Objective: Vitals:   09/24/16 2110 09/24/16 2213 09/24/16 2215 09/25/16  0514  BP: (!) 147/83 (!) 149/86  127/66  Pulse:  74  82  Resp: 12 12  16   Temp:  99 F (37.2 C)  98.4 F (36.9 C)  TempSrc:  Oral  Oral  SpO2: 98% 97%  97%  Weight:   102.7 kg (226 lb 6.6 oz) 102.4 kg (225 lb 12 oz)  Height:   5\' 11"  (1.803 m)     Intake/Output Summary (Last 24 hours) at 09/25/16 1216 Last data filed at 09/25/16 0200  Gross per 24 hour  Intake           436.25 ml  Output                0 ml  Net           436.25 ml   Filed Weights   09/21/16 2103 09/24/16 2215 09/25/16 0514  Weight: 106.6 kg (235 lb) 102.7 kg (226 lb 6.6 oz) 102.4 kg (225 lb 12 oz)   Physical Exam  Constitutional: Appears well-developed and well-nourished. No distress.  CVS: RRR, S1/S2 +, no murmurs, no gallops, no carotid bruit.  Pulmonary: Effort and breath sounds normal, no stridor, rhonchi, wheezes, rales.  Abdominal: Soft. BS +,  no distension, tenderness is mild and in epigastric area, no rebound or guarding.  Neuro: Alert. Normal reflexes, muscle tone coordination. No cranial nerve deficit.  Data Reviewed: I have personally reviewed following labs and imaging studies  CBC:  Recent Labs Lab 09/21/16 2121 09/23/16 0307 09/25/16 0542  WBC 13.1*  11.6* 6.7  HGB 17.8* 14.9 13.8  HCT 51.1 42.6 39.6  MCV 84.2 84.0 82.8  PLT 313 260 220   Basic Metabolic Panel:  Recent Labs Lab 09/23/16 0743 09/23/16 1128 09/23/16 1534 09/23/16 1918 09/25/16 0542  NA 139 137 136 137 139  K 3.5 3.0* 3.1* 3.1* 3.0*  CL 108 107 107 106 104  CO2 21* 19* 21* 18* 24  GLUCOSE 170* 197* 168* 246* 180*  BUN 12 12 11 10 10   CREATININE 0.83 0.82 0.78 0.78 0.63  CALCIUM 9.3 9.1 8.7* 8.8* 8.6*   Liver Function Tests:  Recent Labs Lab 09/21/16 2121  AST 17  ALT 14*  ALKPHOS 98  BILITOT 1.9*  PROT 8.7*  ALBUMIN 4.9    Recent Labs Lab 09/21/16 2121  LIPASE 16   Cardiac Enzymes:  Recent Labs Lab 09/22/16 0135 09/22/16 0412 09/22/16 1200 09/25/16 1036  TROPONINI <0.03 <0.03 <0.03  <0.03   CBG:  Recent Labs Lab 09/24/16 1646 09/24/16 2102 09/25/16 0227 09/25/16 0752 09/25/16 1132  GLUCAP 171* 165* 161* 176* 197*   Urine analysis:    Component Value Date/Time   COLORURINE YELLOW 09/23/2016 1631   APPEARANCEUR TURBID (A) 09/23/2016 1631   LABSPEC 1.025 09/23/2016 1631   PHURINE 6.0 09/23/2016 1631   GLUCOSEU >=500 (A) 09/23/2016 1631   HGBUR LARGE (A) 09/23/2016 1631   BILIRUBINUR NEGATIVE 09/23/2016 1631   KETONESUR 20 (A) 09/23/2016 1631   PROTEINUR 100 (A) 09/23/2016 1631   NITRITE NEGATIVE 09/23/2016 1631   LEUKOCYTESUR NEGATIVE 09/23/2016 1631    Recent Results (from the past 240 hour(s))  MRSA PCR Screening     Status: None   Collection Time: 09/22/16  1:22 AM  Result Value Ref Range Status   MRSA by PCR NEGATIVE NEGATIVE Final    Comment:        The GeneXpert MRSA Assay (FDA approved for NASAL specimens only), is one component of a comprehensive MRSA colonization surveillance program. It is not intended to diagnose MRSA infection nor to guide or monitor treatment for MRSA infections.     Radiology Studies: No results found.  Scheduled Meds: . aspirin EC  81 mg Oral Daily  . enoxaparin (LOVENOX) injection  40 mg Subcutaneous QHS  . insulin pump   Subcutaneous TID AC, HS, 0200  . magic mouthwash  15 mL Oral QID  . pantoprazole (PROTONIX) IV  40 mg Intravenous Q12H  . potassium chloride  40 mEq Oral Once   Continuous Infusions: . 0.9 % NaCl with KCl 40 mEq / L 75 mL/hr (09/25/16 0906)  . cefTRIAXone (ROCEPHIN)  IV Stopped (09/25/16 0935)    LOS: 4 days   Time spent: 35 minutes   [name], MD [facility] Pager [phone]  If 7PM-7AM, please contact night-coverage [email or web address] Password [email or web address] 09/25/2016, 12:16 PM

## 2016-09-25 NOTE — Progress Notes (Signed)
Pt transferred to 4E from ICU/Stepdown. Pt VS stable. I agree with previous nurses assessment.  Barry Pittman

## 2016-09-26 ENCOUNTER — Inpatient Hospital Stay (HOSPITAL_COMMUNITY): Payer: 59

## 2016-09-26 ENCOUNTER — Other Ambulatory Visit: Payer: Self-pay

## 2016-09-26 LAB — BASIC METABOLIC PANEL
ANION GAP: 10 (ref 5–15)
BUN: 6 mg/dL (ref 6–20)
CALCIUM: 8.9 mg/dL (ref 8.9–10.3)
CO2: 26 mmol/L (ref 22–32)
CREATININE: 0.72 mg/dL (ref 0.61–1.24)
Chloride: 102 mmol/L (ref 101–111)
GFR calc Af Amer: >60 mL/min (ref >60)
GFR calc non Af Amer: >60 mL/min (ref >60)
GLUCOSE: 178 mg/dL — AB (ref 65–99)
Potassium: 3.1 mmol/L — ABNORMAL LOW (ref 3.5–5.1)
Sodium: 138 mmol/L (ref 135–145)

## 2016-09-26 LAB — CBC
HEMATOCRIT: 44.1 % (ref 39.0–52.0)
Hemoglobin: 15.6 g/dL (ref 13.0–17.0)
MCH: 29.6 pg (ref 26.0–34.0)
MCHC: 35.4 g/dL (ref 30.0–36.0)
MCV: 83.7 fL (ref 78.0–100.0)
Platelets: 206 10*3/uL (ref 150–400)
RBC: 5.27 MIL/uL (ref 4.22–5.81)
RDW: 12.4 % (ref 11.5–15.5)
WBC: 5.5 10*3/uL (ref 4.0–10.5)

## 2016-09-26 LAB — GLUCOSE, CAPILLARY
GLUCOSE-CAPILLARY: 150 mg/dL — AB (ref 65–99)
GLUCOSE-CAPILLARY: 175 mg/dL — AB (ref 65–99)
Glucose-Capillary: 137 mg/dL — ABNORMAL HIGH (ref 65–99)
Glucose-Capillary: 166 mg/dL — ABNORMAL HIGH (ref 65–99)
Glucose-Capillary: 155 mg/dL — ABNORMAL HIGH (ref 65–99)

## 2016-09-26 MED ORDER — PANTOPRAZOLE SODIUM 40 MG PO TBEC
40.0000 mg | DELAYED_RELEASE_TABLET | Freq: Two times a day (BID) | ORAL | Status: DC
  Administered 2016-09-26 – 2016-09-27 (×3): 40 mg via ORAL
  Filled 2016-09-26 (×3): qty 1

## 2016-09-26 MED ORDER — POTASSIUM CHLORIDE CRYS ER 20 MEQ PO TBCR
40.0000 meq | EXTENDED_RELEASE_TABLET | Freq: Once | ORAL | Status: AC
  Administered 2016-09-27: 40 meq via ORAL
  Filled 2016-09-26: qty 2

## 2016-09-26 MED ORDER — INSULIN LISPRO 100 UNIT/ML ~~LOC~~ SOLN: SUBCUTANEOUS | 3 refills | Status: DC

## 2016-09-26 NOTE — Care Management Note (Signed)
Case Management Note  Patient Details  Name: NEMIAH KISSNER MRN: [id number] Date of Birth: 01-Mar-1986  Subjective/Objective: 31 y/o m admitted w/DKA. From home. Referral for benefit check-Humalog 10u sq tid-per CMA humalog-$28 retail;$24 online. No prior auth needed.MD updated.                   Action/Plan:d/c plan home.   Expected Discharge Date:                  Expected Discharge Plan:  Home/Self Care  In-House Referral:     Discharge planning Services  CM Consult, Medication Assistance  Post Acute Care Choice:    Choice offered to:     DME Arranged:    DME Agency:     HH Arranged:    HH Agency:     Status of Service:  In process, will continue to follow  If discussed at Long Length of Stay Meetings, dates discussed:    Additional Comments:  Dessa Phi, RN 09/26/2016, 1:01 PM

## 2016-09-26 NOTE — Progress Notes (Signed)
PROGRESS NOTE  Barry Pittman  XKG:[id number] DOB: [birth year] DOA: 09/21/2016  PCP: Patient, No Pcp Per   Outpatient Specialists: Dr. Cruzita Lederer, endocrinologist   Brief Narrative:  Patient is 31 year old male with known diabetes, follows with endocrinologist. Presented to [facility] emergency department with several days duration of progressively worsening nausea and poor oral intake, progressed to persistent nonbloody vomiting and unable to keep anything down. On admission, noted to be in DKA and TRH asked to admit for further evaluation.  Assessment & Plan:   Principal Problem:   DKA (diabetic ketoacidoses) ([place]) - In the setting of persistent nausea and vomiting, inability to tolerate by mouth intake - pt and his wife suspect this to be related to Novolog intolerance - Patient was improving over the weekend but reports that he has had several episodes of vomiting overnight - I do not think this is related to DKA as patient is currently out of DKA and sugar levels have been relatively stable - I have discussed this with gastroenterologist that has already been consulted few days ago, recommendation was to proceed with upper GI small bowel study prior to considering any endoscopic evaluation - Order placed, further recommendations pending results of upper GI study  Active Problems:   Intractable nausea and vomiting, hypokalemia  - Unclear etiology, consulted with GI - CT abdomen pelvis with no specific acute etiology that would explain nausea and vomiting - X-ray of the abdomen also with no signs of obstruction or free air - Upper GI study as noted above - K is still low so we'll need to supplement    UTI  - Continue Rocephin day 3, follow-up on urine cultures    Chest pain - Resolved, discontinue to telemetry  DVT prophylaxis: Lovenox Code Status: Full Family Communication: Patient and wife at bedside Disposition Plan: Home when clinically improved  Consultants:    Endocrinologist over the phone, Dr. Cruzita Lederer  GI  Procedures:   None  Antimicrobials:   Rocephin 6/9 -->  Subjective: Patient reports feeling better this morning but had rough night as he threw up several times.  Objective: Vitals:   09/25/16 0514 09/25/16 1450 09/25/16 2049 09/26/16 0521  BP: 127/66 (!) 141/90 (!) 163/80 (!) 147/85  Pulse: 82 79 68 70  Resp: 16 17 16 14   Temp: 98.4 F (36.9 C) 98 F (36.7 C) 99.1 F (37.3 C) 98.3 F (36.8 C)  TempSrc: Oral Oral Oral Oral  SpO2: 97% 98% 100% 100%  Weight: 102.4 kg (225 lb 12 oz)     Height:        Intake/Output Summary (Last 24 hours) at 09/26/16 1217 Last data filed at 09/26/16 0200  Gross per 24 hour  Intake             1320 ml  Output                0 ml  Net             1320 ml   Filed Weights   09/21/16 2103 09/24/16 2215 09/25/16 0514  Weight: 106.6 kg (235 lb) 102.7 kg (226 lb 6.6 oz) 102.4 kg (225 lb 12 oz)   Physical Exam  Constitutional: Appears well-developed and well-nourished. No distress.  CVS: RRR, S1/S2 +, no murmurs, no gallops, no carotid bruit.  Pulmonary: Effort and breath sounds normal, no stridor, rhonchi, wheezes, rales.  Abdominal: Soft. BS +,  no distension, tenderness Is mild and then epigastric area, no rebound or guarding.  Musculoskeletal: Normal range of motion. No edema and no tenderness.  Lymphadenopathy: No lymphadenopathy noted, cervical, inguinal. Neuro: Alert. Normal reflexes, muscle tone coordination. No cranial nerve deficit. Skin: Skin is warm and dry. No rash noted. Not diaphoretic. No erythema. No pallor.  Psychiatric: Normal mood and affect. Behavior, judgment, thought content normal.   Data Reviewed: I have personally reviewed following labs and imaging studies  CBC:  Recent Labs Lab 09/21/16 2121 09/23/16 0307 09/25/16 0542 09/26/16 0731  WBC 13.1* 11.6* 6.7 5.5  HGB 17.8* 14.9 13.8 15.6  HCT 51.1 42.6 39.6 44.1  MCV 84.2 84.0 82.8 83.7  PLT 313 260 241  809   Basic Metabolic Panel:  Recent Labs Lab 09/23/16 1128 09/23/16 1534 09/23/16 1918 09/25/16 0542 09/26/16 0731  NA 137 136 137 139 138  K 3.0* 3.1* 3.1* 3.0* 3.1*  CL 107 107 106 104 102  CO2 19* 21* 18* 24 26  GLUCOSE 197* 168* 246* 180* 178*  BUN 12 11 10 10 6   CREATININE 0.82 0.78 0.78 0.63 0.72  CALCIUM 9.1 8.7* 8.8* 8.6* 8.9   Liver Function Tests:  Recent Labs Lab 09/21/16 2121  AST 17  ALT 14*  ALKPHOS 98  BILITOT 1.9*  PROT 8.7*  ALBUMIN 4.9    Recent Labs Lab 09/21/16 2121  LIPASE 16   Cardiac Enzymes:  Recent Labs Lab 09/22/16 0135 09/22/16 0412 09/22/16 1200 09/25/16 1036  TROPONINI <0.03 <0.03 <0.03 <0.03   CBG:  Recent Labs Lab 09/25/16 1635 09/25/16 2044 09/26/16 0223 09/26/16 0757 09/26/16 1211  GLUCAP 154* 188* 137* 166* 155*   Urine analysis:    Component Value Date/Time   COLORURINE YELLOW 09/23/2016 1631   APPEARANCEUR TURBID (A) 09/23/2016 1631   LABSPEC 1.025 09/23/2016 1631   PHURINE 6.0 09/23/2016 1631   GLUCOSEU >=500 (A) 09/23/2016 1631   HGBUR LARGE (A) 09/23/2016 1631   BILIRUBINUR NEGATIVE 09/23/2016 1631   KETONESUR 20 (A) 09/23/2016 1631   PROTEINUR 100 (A) 09/23/2016 1631   NITRITE NEGATIVE 09/23/2016 1631   LEUKOCYTESUR NEGATIVE 09/23/2016 1631   Radiology Studies: No results found.  Scheduled Meds: . aspirin EC  81 mg Oral Daily  . enoxaparin (LOVENOX) injection  40 mg Subcutaneous QHS  . insulin pump   Subcutaneous TID AC, HS, 0200  . magic mouthwash  15 mL Oral QID  . pantoprazole  40 mg Oral BID  . potassium chloride  40 mEq Oral Once   Continuous Infusions: . 0.9 % NaCl with KCl 40 mEq / L 75 mL/hr (09/26/16 0729)  . cefTRIAXone (ROCEPHIN)  IV Stopped (09/26/16 0944)    LOS: 5 days   Time spent: 35 minutes  [name], MD [facility] Pager [phone]  If 7PM-7AM, please contact night-coverage [email or web address] Password [facility] 09/26/2016, 12:17 PM

## 2016-09-26 NOTE — Progress Notes (Signed)
The patient is receiving Protonix by the intravenous route.  Based on criteria approved by the Pharmacy and [place], the medication is being converted to the equivalent oral dose form.  These criteria include: -No active GI bleeding -Able to tolerate diet of full liquids (or better) or tube feeding -Able to tolerate other medications by the oral or enteral route  If you have any questions about this conversion, please contact the Pharmacy Department (phone [phone]).  Thank you. The patient is receiving Protonix by the intravenous route.  Based on criteria approved by the Pharmacy and [place], the medication is being converted to the equivalent oral dose form.  These criteria include: -No active GI bleeding -Able to tolerate diet of full liquids (or better) or tube feeding -Able to tolerate other medications by the oral or enteral route  If you have any questions about this conversion, please contact the Pharmacy Department (phone [phone]).  Thank you. Barry Pittman, Pharm.D. [phone] 09/26/2016 8:14 AM

## 2016-09-27 LAB — BASIC METABOLIC PANEL
Anion gap: 10 (ref 5–15)
BUN: 6 mg/dL (ref 6–20)
CALCIUM: 8.6 mg/dL — AB (ref 8.9–10.3)
CO2: 26 mmol/L (ref 22–32)
CREATININE: 0.71 mg/dL (ref 0.61–1.24)
Chloride: 101 mmol/L (ref 101–111)
Glucose, Bld: 167 mg/dL — ABNORMAL HIGH (ref 65–99)
Potassium: 3.6 mmol/L (ref 3.5–5.1)
SODIUM: 137 mmol/L (ref 135–145)

## 2016-09-27 LAB — CBC
HCT: 40.5 % (ref 39.0–52.0)
Hemoglobin: 14.5 g/dL (ref 13.0–17.0)
MCH: 29.2 pg (ref 26.0–34.0)
MCHC: 35.8 g/dL (ref 30.0–36.0)
MCV: 81.5 fL (ref 78.0–100.0)
PLATELETS: 249 10*3/uL (ref 150–400)
RBC: 4.97 MIL/uL (ref 4.22–5.81)
RDW: 12.3 % (ref 11.5–15.5)
WBC: 6.9 10*3/uL (ref 4.0–10.5)

## 2016-09-27 LAB — GLUCOSE, CAPILLARY
GLUCOSE-CAPILLARY: 168 mg/dL — AB (ref 65–99)
GLUCOSE-CAPILLARY: 146 mg/dL — AB (ref 65–99)
Glucose-Capillary: 139 mg/dL — ABNORMAL HIGH (ref 65–99)

## 2016-09-27 MED ORDER — ONDANSETRON HCL 4 MG PO TABS: 4.0000 mg | ORAL_TABLET | Freq: Every day | ORAL | 0 refills | Status: DC | PRN

## 2016-09-27 MED ORDER — INSULIN LISPRO 100 UNIT/ML ~~LOC~~ SOLN: SUBCUTANEOUS | 3 refills | Status: DC

## 2016-09-27 MED ORDER — INSULIN PUMP: 1.0000 | Freq: Three times a day (TID) | SUBCUTANEOUS | 0 refills | Status: DC

## 2016-09-27 MED ORDER — METOCLOPRAMIDE HCL 10 MG PO TABS: 10.0000 mg | ORAL_TABLET | Freq: Three times a day (TID) | ORAL | 0 refills | Status: DC | PRN

## 2016-09-27 NOTE — Plan of Care (Signed)
Problem: Health Behavior/Discharge Planning: Goal: Ability to manage health-related needs will improve Outcome: Completed/Met Date Met: 09/27/16 .  Problem: Pain Managment: Goal: General experience of comfort will improve Outcome: Completed/Met Date Met: 09/27/16 Denies pain.  Problem: Physical Regulation: Goal: Ability to maintain clinical measurements within normal limits will improve Outcome: Progressing . Goal: Will remain free from infection Outcome: Completed/Met Date Met: 09/27/16 .  Problem: Fluid Volume: Goal: Ability to maintain a balanced intake and output will improve Outcome: Progressing .  Problem: Nutrition: Goal: Adequate nutrition will be maintained Outcome: Progressing Pt ate most of breakfast this morning.  States, "I'm hungry".  Problem: Bowel/Gastric: Goal: Will not experience complications related to bowel motility Outcome: Progressing Bowel sounds present.

## 2016-09-27 NOTE — Discharge Summary (Signed)
Physician Discharge Summary  Barry Pittman PJA:[id number] DOB: Oct 24, 1985 DOA: 09/21/2016  PCP: Patient, No Pcp Per  Admit date: 09/21/2016 Discharge date: 09/27/2016  Recommendations for Outpatient Follow-up:  1. Pt will need to follow up with PCP in 1-2 weeks post discharge 2. Please obtain BMP to evaluate electrolytes and kidney function 3. Please also note that pt was transitioned back to Insulin pump Humalog, insurance covered pt's medication   Discharge Diagnoses:  Principal Problem:   DKA (diabetic ketoacidoses) ([place]) Active Problems:   Intractable nausea and vomiting   Chest pain   DKA, type 1 ([place])  Discharge Condition: Stable  Diet recommendation: Heart healthy diet discussed in details   History of present illness:  Patient is 31 year old male with known diabetes, follows with endocrinologist. Presented to [facility] emergency department with several days duration of progressively worsening nausea and poor oral intake, progressed to persistent nonbloody vomiting and unable to keep anything down. On admission, noted to be in DKA and [facility] asked to admit for further evaluation.  Assessment & Plan:   Principal Problem:   DKA (diabetic ketoacidoses) ([place]) - In the setting of persistent nausea and vomiting, inability to tolerate by mouth intake - pt and his wife suspect this to be related to Novolog intolerance - Patient was improving over the weekend but reports that he has had several episodes of vomiting overnight - I do not think this is related to DKA as patient is currently out of DKA and sugar levels have been relatively stable - I have discussed this with gastroenterologist that has already been consulted few days ago, recommendation was to proceed with upper GI small bowel study prior to considering any endoscopic evaluation - Order placed, further recommendations pending results of upper GI study  Active Problems:   Intractable nausea and vomiting,  hypokalemia, gastroparesis  - Unclear etiology, consulted with GI - CT abdomen pelvis with no specific acute etiology that would explain nausea and vomiting - X-ray of the abdomen also with no signs of obstruction or free air - Upper GI study with delayed emptying otherwise unremarkable  - pt now tolerating diet and wants to go home - insulin script provided and insurance covered for insulin Humalog - pt to be discharged home     UTI  - urine culture negative, no need for outpatient ABX    Chest pain - Resolved, discontinue to telemetry  DVT prophylaxis: Lovenox Code Status: Full Family Communication: pt and wife at bedside  Disposition Plan: Home  Consultants:   Endocrinologist over the phone, Dr. Cruzita Lederer  GI  Procedures:   None  Procedures/Studies: Dg Chest 2 View  Result Date: 09/21/2016 CLINICAL DATA:  Generalized chest pain with shortness of breath EXAM: CHEST  2 VIEW COMPARISON:  05/26/2015 FINDINGS: The heart size and mediastinal contours are within normal limits. Both lungs are clear. The visualized skeletal structures are unremarkable. IMPRESSION: No active cardiopulmonary disease. Electronically Signed   By: Donavan Foil M.D.   On: 09/21/2016 21:50   Ct Chest W Contrast  Result Date: 09/22/2016 CLINICAL DATA:  Nausea and vomiting.  Loss of weight. EXAM: CT CHEST, ABDOMEN, AND PELVIS WITH CONTRAST TECHNIQUE: Multidetector CT imaging of the chest, abdomen and pelvis was performed following the standard protocol during bolus administration of intravenous contrast. CONTRAST:  148mL ISOVUE-300 IOPAMIDOL (ISOVUE-300) INJECTION 61% COMPARISON:  None. FINDINGS: CT CHEST FINDINGS Cardiovascular: No significant vascular findings. Normal heart size. No pericardial effusion. Mediastinum/Nodes: No enlarged mediastinal, hilar, or axillary lymph nodes.  Thyroid gland, trachea, and esophagus demonstrate no significant findings. Lungs/Pleura: No pleural effusion. Musculoskeletal: No  chest wall mass or suspicious bone lesions identified. CT ABDOMEN PELVIS FINDINGS Hepatobiliary: No focal liver abnormality is seen. No gallstones, gallbladder wall thickening, or biliary dilatation. Pancreas: Unremarkable. No pancreatic ductal dilatation or surrounding inflammatory changes. Spleen: Normal in size without focal abnormality. Adrenals/Urinary Tract: Adrenal glands are unremarkable. Kidneys are normal, without renal calculi, focal lesion, or hydronephrosis. Bladder is unremarkable. Stomach/Bowel: Stomach is within normal limits. Appendix appears normal. No evidence of bowel wall thickening, distention, or inflammatory changes. Vascular/Lymphatic: No significant vascular findings are present. No enlarged abdominal or pelvic lymph nodes. Reproductive: Prostate is unremarkable. Other: No abdominal wall hernia or abnormality. No abdominopelvic ascites. Musculoskeletal: No acute or significant osseous findings. IMPRESSION: 1. There is no mass or adenopathy identified within the chest, abdomen or pelvis. 2. No acute findings identified. Electronically Signed   By: Kerby Moors M.D.   On: 09/22/2016 16:00   Ct Abdomen Pelvis W Contrast  Result Date: 09/22/2016 CLINICAL DATA:  Nausea and vomiting.  Loss of weight. EXAM: CT CHEST, ABDOMEN, AND PELVIS WITH CONTRAST TECHNIQUE: Multidetector CT imaging of the chest, abdomen and pelvis was performed following the standard protocol during bolus administration of intravenous contrast. CONTRAST:  165mL ISOVUE-300 IOPAMIDOL (ISOVUE-300) INJECTION 61% COMPARISON:  None. FINDINGS: CT CHEST FINDINGS Cardiovascular: No significant vascular findings. Normal heart size. No pericardial effusion. Mediastinum/Nodes: No enlarged mediastinal, hilar, or axillary lymph nodes. Thyroid gland, trachea, and esophagus demonstrate no significant findings. Lungs/Pleura: No pleural effusion. Musculoskeletal: No chest wall mass or suspicious bone lesions identified. CT ABDOMEN PELVIS  FINDINGS Hepatobiliary: No focal liver abnormality is seen. No gallstones, gallbladder wall thickening, or biliary dilatation. Pancreas: Unremarkable. No pancreatic ductal dilatation or surrounding inflammatory changes. Spleen: Normal in size without focal abnormality. Adrenals/Urinary Tract: Adrenal glands are unremarkable. Kidneys are normal, without renal calculi, focal lesion, or hydronephrosis. Bladder is unremarkable. Stomach/Bowel: Stomach is within normal limits. Appendix appears normal. No evidence of bowel wall thickening, distention, or inflammatory changes. Vascular/Lymphatic: No significant vascular findings are present. No enlarged abdominal or pelvic lymph nodes. Reproductive: Prostate is unremarkable. Other: No abdominal wall hernia or abnormality. No abdominopelvic ascites. Musculoskeletal: No acute or significant osseous findings. IMPRESSION: 1. There is no mass or adenopathy identified within the chest, abdomen or pelvis. 2. No acute findings identified. Electronically Signed   By: Kerby Moors M.D.   On: 09/22/2016 16:00   Dg Abd 2 Views  Result Date: 09/23/2016 CLINICAL DATA:  Nausea, vomiting, abdominal distention EXAM: ABDOMEN - 2 VIEW COMPARISON:  CT 09/22/2016 FINDINGS: The bowel gas pattern is normal. There is no evidence of free air. No radio-opaque calculi or other significant radiographic abnormality is seen. IMPRESSION: Negative. Electronically Signed   By: Rolm Baptise M.D.   On: 09/23/2016 12:17   Dg Ugi  W/kub  Result Date: 09/26/2016 CLINICAL DATA:  Vomiting.  Recent DKA EXAM: UPPER GI SERIES WITH KUB TECHNIQUE: After obtaining a scout radiograph a routine upper GI series was performed using thin barium FLUOROSCOPY TIME:  Fluoroscopy Time:  1 minutes 30 seconds Radiation Exposure Index (if provided by the fluoroscopic device): 20.1 mGy Number of Acquired Spot Images: 7 COMPARISON:  Abdominal CT from 09/22/2016 FINDINGS: Scout image shows normal bowel gas pattern. No  concerning mass effect or gas collection. Limited study due to patient's nausea which precluded double contrast study and resulted in small swallows. Oblique pharyngeal imaging shows no aspiration or obstructive process. Esophagus  has normal distensibility and contour. No mucosal lesion was seen. The stomach has normal shape and distensibility. No hiatal hernia or noted reflux. No mass or fold thickening is noted as permitted by degree of distention. No ulceration is seen. Limited evaluation of the duodenal C-loop. Despite laying on the right side for 10 minutes, minimal contrast exited the stomach. The visible duodenal C-loop is nondilated. In the upright position, the esophagus has normal motility. IMPRESSION: 1. Negative esophagus and stomach. 2. Slow emptying of the stomach. 3. Abbreviated study due to patient's nausea. Electronically Signed   By: Monte Fantasia M.D.   On: 09/26/2016 15:36     Discharge Exam: Vitals:   09/26/16 2017 09/27/16 0602  BP: 132/77 (!) 154/89  Pulse: 66 78  Resp: 17 18  Temp: 99.2 F (37.3 C) 98.9 F (37.2 C)   Vitals:   09/26/16 0521 09/26/16 1612 09/26/16 2017 09/27/16 0602  BP: (!) 147/85 140/79 132/77 (!) 154/89  Pulse: 70 72 66 78  Resp: 14 16 17 18   Temp: 98.3 F (36.8 C) 98.5 F (36.9 C) 99.2 F (37.3 C) 98.9 F (37.2 C)  TempSrc: Oral Oral Oral Oral  SpO2: 100% 100% 100% 97%  Weight:      Height:        General: Pt is alert, follows commands appropriately, not in acute distress Cardiovascular: Regular rate and rhythm, S1/S2 +, no murmurs, no rubs, no gallops Respiratory: Clear to auscultation bilaterally, no wheezing, no crackles, no rhonchi Abdominal: Soft, non tender, non distended, bowel sounds +, no guarding Extremities: no edema, no cyanosis, pulses palpable bilaterally DP and PT Neuro: Grossly nonfocal  Discharge Instructions  Discharge Instructions    Diet - low sodium heart healthy    Complete by:  As directed    Increase  activity slowly    Complete by:  As directed      Allergies as of 09/27/2016      Reactions   Novolog [insulin Aspart]       Medication List    TAKE these medications   glucagon 1 MG injection Commonly known as:  GLUCAGON EMERGENCY Inject 1 mg into the muscle once as needed. What changed:  when to take this  reasons to take this   glucose blood test strip Commonly known as:  FREESTYLE LITE Use as instructed to check sugar 4 times daily   insulin lispro 100 UNIT/ML injection Commonly known as:  HUMALOG Inject 86 units into the skin over 24 hours via pump   insulin pump Soln Inject 1 each into the skin 3 times daily with meals, bedtime and 2 AM.   lisinopril 20 MG tablet Commonly known as:  PRINIVIL,ZESTRIL Take 1 tablet (20 mg total) by mouth daily.   metoCLOPramide 10 MG tablet Commonly known as:  REGLAN Take 1 tablet (10 mg total) by mouth every 8 (eight) hours as needed for nausea.   MULTIVITAMIN ADULTS PO Take 1 tablet by mouth daily.   ondansetron 4 MG tablet Commonly known as:  ZOFRAN Take 1 tablet (4 mg total) by mouth daily as needed for nausea or vomiting.      Follow-up Information    Theodis Blaze, MD Follow up.   Specialty:  Internal Medicine Why:  Call me with any questions, [phone] Contact information: [address] [place] [address] [phone]            The results of significant diagnostics from this hospitalization (including imaging, microbiology, ancillary  and laboratory) are listed below for reference.     Microbiology: Recent Results (from the past 240 hour(s))  MRSA PCR Screening     Status: None   Collection Time: 09/22/16  1:22 AM  Result Value Ref Range Status   MRSA by PCR NEGATIVE NEGATIVE Final    Comment:        The GeneXpert MRSA Assay (FDA approved for NASAL specimens only), is one component of a comprehensive MRSA colonization surveillance program. It is not intended to  diagnose MRSA infection nor to guide or monitor treatment for MRSA infections.   Culture, Urine     Status: None   Collection Time: 09/24/16 10:49 AM  Result Value Ref Range Status   Specimen Description URINE, RANDOM  Final   Special Requests NONE  Final   Culture   Final    NO GROWTH Performed at [facility], [address], [place], [place] [address]    Report Status 09/25/2016 FINAL  Final     Labs: Basic Metabolic Panel:  Recent Labs Lab 09/23/16 1534 09/23/16 1918 09/25/16 0542 09/26/16 0731 09/27/16 0719  NA 136 137 139 138 137  K 3.1* 3.1* 3.0* 3.1* 3.6  CL 107 106 104 102 101  CO2 21* 18* 24 26 26   GLUCOSE 168* 246* 180* 178* 167*  BUN 11 10 10 6 6   CREATININE 0.78 0.78 0.63 0.72 0.71  CALCIUM 8.7* 8.8* 8.6* 8.9 8.6*   Liver Function Tests:  Recent Labs Lab 09/21/16 2121  AST 17  ALT 14*  ALKPHOS 98  BILITOT 1.9*  PROT 8.7*  ALBUMIN 4.9    Recent Labs Lab 09/21/16 2121  LIPASE 16   CBC:  Recent Labs Lab 09/21/16 2121 09/23/16 0307 09/25/16 0542 09/26/16 0731 09/27/16 0719  WBC 13.1* 11.6* 6.7 5.5 6.9  HGB 17.8* 14.9 13.8 15.6 14.5  HCT 51.1 42.6 39.6 44.1 40.5  MCV 84.2 84.0 82.8 83.7 81.5  PLT 313 260 241 206 249   Cardiac Enzymes:  Recent Labs Lab 09/22/16 0135 09/22/16 0412 09/22/16 1200 09/25/16 1036  TROPONINI <0.03 <0.03 <0.03 <0.03   CBG:  Recent Labs Lab 09/26/16 1737 09/26/16 2058 09/27/16 0202 09/27/16 0729 09/27/16 1135  GLUCAP 150* 175* 168* 146* 139*   SIGNED: Time coordinating discharge: 30 minutes  [name], MD  [facility] 09/27/2016, 2:58 PM Pager [phone]  If 7PM-7AM, please contact night-coverage [email or web address] Password [email or web address]

## 2016-09-27 NOTE — Discharge Instructions (Signed)
Gastroparesis °Gastroparesis, also called delayed gastric emptying, is a condition in which food takes longer than normal to empty from the stomach. The condition is usually long-lasting (chronic). °What are the causes? °This condition may be caused by: °· An endocrine disorder, such as hypothyroidism or diabetes. Diabetes is the most common cause of this condition. °· A nervous system disease, such as Parkinson disease or multiple sclerosis. °· Cancer, infection, or surgery of the stomach or vagus nerve. °· A connective tissue disorder, such as scleroderma. °· Certain medicines. ° °In most cases, the cause is not known. °What increases the risk? °This condition is more likely to develop in: °· People with certain disorders, including endocrine disorders, eating disorders, amyloidosis, and scleroderma. °· People with certain diseases, including Parkinson disease or multiple sclerosis. °· People with cancer or infection of the stomach or vagus nerve. °· People who have had surgery on the stomach or vagus nerve. °· People who take certain medicines. °· Women. ° °What are the signs or symptoms? °Symptoms of this condition include: °· An early feeling of fullness when eating. °· Nausea. °· Weight loss. °· Vomiting. °· Heartburn. °· Abdominal bloating. °· Inconsistent blood glucose levels. °· Lack of appetite. °· Acid from the stomach coming up into the esophagus (gastroesophageal reflux). °· Spasms of the stomach. ° °Symptoms may come and go. °How is this diagnosed? °This condition is diagnosed with tests, such as: °· Tests that check how long it takes food to move through the stomach and intestines. These tests include: °? Upper gastrointestinal (GI) series. In this test, X-rays of the intestines are taken after you drink a liquid. The liquid makes the intestines show up better on the X-rays. °? Gastric emptying scintigraphy. In this test, scans are taken after you eat food that contains a small amount of radioactive  material. °? Wireless capsule GI monitoring system. This test involves swallowing a capsule that records information about movement through the stomach. °· Gastric manometry. This test measures electrical and muscular activity in the stomach. It is done with a thin tube that is passed down the throat and into the stomach. °· Endoscopy. This test checks for abnormalities in the lining of the stomach. It is done with a long, thin tube that is passed down the throat and into the stomach. °· An ultrasound. This test can help rule out gallbladder disease or pancreatitis as a cause of your symptoms. It uses sound waves to take pictures of the inside of your body. ° °How is this treated? °There is no cure for gastroparesis. This condition may be managed with: °· Treatment of the underlying condition causing the gastroparesis. °· Lifestyle changes, including exercise and dietary changes. Dietary changes can include: °? Changes in what and when you eat. °? Eating smaller meals more often. °? Eating low-fat foods. °? Eating low-fiber forms of high-fiber foods, such as cooked vegetables instead of raw vegetables. °? Having liquid foods in place of solid foods. Liquid foods are easier to digest. °· Medicines. These may be given to control nausea and vomiting and to stimulate stomach muscles. °· Getting food through a feeding tube. This may be done in severe cases. °· A gastric neurostimulator. This is a device that is inserted into the body with surgery. It helps improve stomach emptying and control nausea and vomiting. ° °Follow these instructions at home: °· Follow your health care provider's instructions about exercise and diet. °· Take medicines only as directed by your health care provider. °Contact a   health care provider if: °· Your symptoms do not improve with treatment. °· You have new symptoms. °Get help right away if: °· You have severe abdominal pain that does not improve with treatment. °· You have nausea that does  not go away. °· You cannot keep fluids down. °This information is not intended to replace advice given to you by your health care provider. Make sure you discuss any questions you have with your health care provider. °Document Released: 04/04/2005 Document Revised: 09/10/2015 Document Reviewed: 03/31/2014 °Elsevier Interactive Patient Education © 2018 [employer] ° °

## 2016-11-04 LAB — HM DIABETES EYE EXAM

## 2016-12-06 ENCOUNTER — Encounter: Payer: Self-pay | Admitting: Internal Medicine

## 2016-12-06 ENCOUNTER — Ambulatory Visit (INDEPENDENT_AMBULATORY_CARE_PROVIDER_SITE_OTHER): Payer: 59 | Admitting: Internal Medicine

## 2016-12-06 VITALS — BP 148/88 | HR 98 | Wt 231.0 lb

## 2016-12-06 DIAGNOSIS — E10319 Type 1 diabetes mellitus with unspecified diabetic retinopathy without macular edema: Secondary | ICD-10-CM

## 2016-12-06 LAB — LIPID PANEL
CHOL/HDL RATIO: 3
Cholesterol: 141 mg/dL (ref 0–200)
HDL: 51.7 mg/dL (ref >39.00)
LDL CALC: 68 mg/dL (ref 0–99)
NONHDL: 89.18
TRIGLYCERIDES: 107 mg/dL (ref 0.0–149.0)
VLDL: 21.4 mg/dL (ref 0.0–40.0)

## 2016-12-06 LAB — HEPATIC FUNCTION PANEL
ALBUMIN: 4 g/dL (ref 3.5–5.2)
ALT: 9 U/L (ref 0–53)
AST: 14 U/L (ref 0–37)
Alkaline Phosphatase: 74 U/L (ref 39–117)
Bilirubin, Direct: 0.2 mg/dL (ref 0.0–0.3)
Total Bilirubin: 0.5 mg/dL (ref 0.2–1.2)
Total Protein: 7.4 g/dL (ref 6.0–8.3)

## 2016-12-06 LAB — MICROALBUMIN / CREATININE URINE RATIO
Creatinine,U: 205.3 mg/dL
Microalb Creat Ratio: 0.6 mg/g (ref 0.0–30.0)
Microalb, Ur: 1.3 mg/dL (ref 0.0–1.9)

## 2016-12-06 LAB — TSH: TSH: 0.87 u[IU]/mL (ref 0.35–4.50)

## 2016-12-06 LAB — POCT GLYCOSYLATED HEMOGLOBIN (HGB A1C): Hemoglobin A1C: 11.9

## 2016-12-06 NOTE — Patient Instructions (Addendum)
Please change the pump settings as follows: - basal rates: 12 am: 1.6 4 am: 2.0 6 am: 2.2 9 am: 2.2 11 am: 2.2 10 pm: 2.1 - ICR:   12 am: 7  6 pm: 6 - target: 100-100 - ISF: 30 >> 25 - Insulin on Board: 4h  Please check sugars and enter them in the pump, enter the carbs in the pump and bolus Humalog 10-15 min before every meal.  Please stop at the lab.  Please check with Roxann Ripple if you can get the new [employer] pump.  Please return in 3 months.

## 2016-12-06 NOTE — Progress Notes (Signed)
Patient ID: Barry Pittman, male   DOB: January 27, 1986, 31 y.o.   MRN: [id number]   HPI: Barry Pittman is a 31 y.o.-year-old male, self-referred, for management of DM1, dx'ed at 31 y/o (1999), on insulin pump since 2000, uncontrolled, with  complications (DR)  Since last visit, he had an episode of unexplained DKA.He mentions his sugars only got up to 300s but he could not keep anything down.  Last hemoglobin A1c was: Lab Results  Component Value Date   HGBA1C 10.6 06/21/2016   Pt is on an insulin pump: Minimed Paradigm 722 (got this in 2015), without CGM, uses NovoLog in the pump.  Pump settings: - basal rates: 12 am: 1.4 units/h >> 1.6 4 am: 2.85 >> 2.0 6 am: 3.5 >> 2.2 9 am: 4 >> 2.2 11 am: 3.5 >> 2.2 10 pm: 2.1 - ICR:   12 am: 10 >> 7  6 pm: 8 >> 6 - target: 80-120 >> 100-100 - ISF: 30  - bolus wizard: on TDD from basal insulin: 87% >> 64% TDD from bolus insulin: 13% >> 36% - extended bolusing: not using - changes infusion site: seldom (!) >> q3-4 days now - Meter: Freestyle Lite  Pt checks his sugars 2.9 >> 3.2x a day and they are  - ave 170 +/- 45 >> 185 +/- 42: NO lows. Lowest sugar was 35 - years ago, more recently, 60s >> now 100; he has hypoglycemia awareness at 80. No previous hypoglycemia admission. He does have a glucagon kit at home. Highest sugar was HI >> 234 He had previous DKA admissions: 1. 05/2015 - Flu and PNA. 2. 09/21/2016 - ? reason  Pt's meals are: - Breakfast: Mac Muffin - Lunch: Subway - Dinner: fast food - Snacks: yoghurt, cheese crackers, diet Mtn Dew  - No CKD, last BUN/creatinine:  Lab Results  Component Value Date   BUN 6 09/27/2016   BUN 6 09/26/2016   CREATININE 0.71 09/27/2016   CREATININE 0.72 09/26/2016  On Lisinopril - last set of lipids: No results found for: CHOL, HDL, LDLCALC, LDLDIRECT, TRIG, CHOLHDL - last eye exam was 10/2016 >> + DR.  Dr. Baird Cancer. Had retina surgery. - Denies numbness and tingling in his  feet.  Last TSH: No results found for: TSH  Pt has FH of DM in M and F. Both died before 31 y/o.  He also has a history of HTN, HL.  ROS: Constitutional: no weight gain/no weight loss, no fatigue, no subjective hyperthermia, no subjective hypothermia Eyes: no blurry vision, no xerophthalmia ENT: no sore throat, no nodules palpated in throat, no dysphagia, no odynophagia, no hoarseness Cardiovascular: no CP/no SOB/no palpitations/no leg swelling Respiratory: no cough/no SOB/no wheezing Gastrointestinal: no N/no V/no D/no C/no acid reflux Musculoskeletal: no muscle aches/no joint aches Skin: no rashes, no hair loss Neurological: no tremors/no numbness/no tingling/no dizziness  I reviewed pt's medications, allergies, PMH, social hx, family hx, and changes were documented in the history of present illness. Otherwise, unchanged from my initial visit note.  Past Medical History:  Diagnosis Date  . Diabetes mellitus without complication ([place])   . Hypertension    Past Surgical History:  Procedure Laterality Date  . EYE SURGERY     For retinopathy    Social History   Social History  . Marital status: married    Spouse name: N/A  . Number of children: 2   Occupational History  . sales   Social History Main Topics  . Smoking status: Never  Smoker  . Smokeless tobacco: Never Used  . Alcohol use Yes  . Drug use: No   Current Outpatient Prescriptions on File Prior to Visit  Medication Sig Dispense Refill  . glucagon (GLUCAGON EMERGENCY) 1 MG injection Inject 1 mg into the muscle once as needed. (Patient taking differently: Inject 1 mg into the muscle daily as needed (low blood sugar). ) 1 each 12  . glucose blood (FREESTYLE LITE) test strip Use as instructed to check sugar 4 times daily 400 each 5  . Insulin Human (INSULIN PUMP) SOLN Inject 1 each into the skin 3 times daily with meals, bedtime and 2 AM. 1 each 0  . insulin lispro (HUMALOG) 100 UNIT/ML injection Inject 86 units  into the skin over 24 hours via pump 20 mL 3  . lisinopril (PRINIVIL,ZESTRIL) 20 MG tablet Take 1 tablet (20 mg total) by mouth daily. 30 tablet 3  . metoCLOPramide (REGLAN) 10 MG tablet Take 1 tablet (10 mg total) by mouth every 8 (eight) hours as needed for nausea. 20 tablet 0  . Multiple Vitamins-Minerals (MULTIVITAMIN ADULTS PO) Take 1 tablet by mouth daily.    . ondansetron (ZOFRAN) 4 MG tablet Take 1 tablet (4 mg total) by mouth daily as needed for nausea or vomiting. 20 tablet 0   No current facility-administered medications on file prior to visit.    Allergies  Allergen Reactions  . Novolog [Insulin Aspart]      Family History  Problem Relation Age of Onset  . Diabetes Mother   . Heart disease Mother   . Diabetes Father   . Heart disease Father    PE: BP (!) 148/88 (BP Location: Left Arm, Patient Position: Sitting)   Pulse 98   Wt 231 lb (104.8 kg)   SpO2 98%   BMI 32.22 kg/m  Wt Readings from Last 3 Encounters:  12/06/16 231 lb (104.8 kg)  09/25/16 225 lb 12 oz (102.4 kg)  06/20/16 255 lb (115.7 kg)   Constitutional: overweight, in NAD Eyes: PERRLA, EOMI, no exophthalmos ENT: moist mucous membranes, no thyromegaly, no cervical lymphadenopathy Cardiovascular: tachycardia, RR, No MRG Respiratory: CTA B Gastrointestinal: abdomen soft, NT, ND, BS+ Musculoskeletal: no deformities, strength intact in all 4 Skin: moist, warm, no rashes Neurological: no tremor with outstretched hands, DTR normal in all 4  ASSESSMENT: 1. DM1, uncontrolled, with complications - DR  PLAN:  1. Patient with long-standing, Uncontrolled type 1 diabetes, on insulin pump therapy. At this visit, he is doing better compared with last visit. He is checking his sugars more frequently, changing his infusion site every 3-4 days, and bolusing more for meals. In the last 2 weeks, his sugars have improved compared to before, however, his HbA1c today is 11.9% (higher) due to previously high blood sugars.   - We discussed about the need to check his blood sugars, and her them in the pump, and her the grams of carbs that he is planning to eat and starting an insulin bolus 10-15 minutes before each meal. He has several instances in which he did all these, and his sugars were better. He is aware that he has to work on getting his boluses in to improve his diabetes.   - one pattern that I noticed by reviewing His pump downloads is that he is not dropping his sugars significantly after overcorrection, so we decreased his insulin sensitivity factor at this visit. - I do not feel other changes are necessary at this time - he did not have  time to call the [employer] representative to see if he can change his pump to the Medtronic 670 G but he was given again Roxann Ripple's telephone number and he will call her he is only in his third year of his current pump warranty but he tells me that this pump was refurbished Patient Instructions  Please change the pump settings as follows: - basal rates: 12 am: 1.6 4 am: 2.0 6 am: 2.2 9 am: 2.2 11 am: 2.2 10 pm: 2.1 - ICR:   12 am: 7  6 pm: 6 - target: 100-100 - ISF: 30 >> 25 - Insulin on Board: 4h  Please check sugars and enter them in the pump, enter the carbs in the pump and bolus Humalog 10-15 min before every meal.  Please stop at the lab.  Please check with Roxann Ripple if you can get the new Medtronic pump.  Please return in 3 months.  - He did not know how to introduce to change into his pump, so I do this for him and showed him how to do it - continue checking sugars at different times of the day - check 4x a day, rotating checks - advised for yearly eye exams >> he is UTD - will check annual labs Orders Placed This Encounter  Procedures  . Microalbumin / creatinine urine ratio  . Lipid panel  . TSH  . Hepatic Function Panel  . POCT HgB A1C  - Return to clinic in 3 mo with sugar log   - time spent with the patient: 30 min, of which >50%  was spent in reviewing his pump downloads (will scan), discussing his hyper-glycemic episodes, reviewing previous labs and pump settings and developing a plan to avoid hypo- and hyper-glycemia.   Office Visit on 12/06/2016  Component Date Value Ref Range Status  . Microalb, Ur 12/06/2016 1.3  0.0 - 1.9 mg/dL Final  . Creatinine,U 12/06/2016 205.3  mg/dL Final  . Microalb Creat Ratio 12/06/2016 0.6  0.0 - 30.0 mg/g Final  . Cholesterol 12/06/2016 141  0 - 200 mg/dL Final   ATP III Classification       Desirable:  < 200 mg/dL               Borderline High:  200 - 239 mg/dL          High:  > = 240 mg/dL  . Triglycerides 12/06/2016 107.0  0.0 - 149.0 mg/dL Final   Normal:  <150 mg/dLBorderline High:  150 - 199 mg/dL  . HDL 12/06/2016 51.70  >39.00 mg/dL Final  . VLDL 12/06/2016 21.4  0.0 - 40.0 mg/dL Final  . LDL Cholesterol 12/06/2016 68  0 - 99 mg/dL Final  . Total CHOL/HDL Ratio 12/06/2016 3   Final                  Men          Women1/2 Average Risk     3.4          3.3Average Risk          5.0          4.42X Average Risk          9.6          7.13X Average Risk          15.0          11.0                      .  NonHDL 12/06/2016 89.18   Final   NOTE:  Non-HDL goal should be 30 mg/dL higher than patient's LDL goal (i.e. LDL goal of < 70 mg/dL, would have non-HDL goal of < 100 mg/dL)  . TSH 12/06/2016 0.87  0.35 - 4.50 uIU/mL Final  . Total Bilirubin 12/06/2016 0.5  0.2 - 1.2 mg/dL Final  . Bilirubin, Direct 12/06/2016 0.2  0.0 - 0.3 mg/dL Final  . Alkaline Phosphatase 12/06/2016 74  39 - 117 U/L Final  . AST 12/06/2016 14  0 - 37 U/L Final  . ALT 12/06/2016 9  0 - 53 U/L Final  . Total Protein 12/06/2016 7.4  6.0 - 8.3 g/dL Final  . Albumin 12/06/2016 4.0  3.5 - 5.2 g/dL Final  . Hemoglobin A1C 12/06/2016 11.9   Final   Normal labs (except hbA1c).  Philemon Kingdom, MD PhD [facility]

## 2016-12-08 ENCOUNTER — Telehealth: Payer: Self-pay

## 2016-12-08 NOTE — Telephone Encounter (Signed)
-----   Message from Philemon Kingdom, MD sent at 12/07/2016  1:23 PM EDT ----- Almyra Free, can you please call pt: All tests are normal.

## 2016-12-08 NOTE — Telephone Encounter (Signed)
Called patient and LVM advising of lab results, also advising that we had the DMV paperwork if he wanted to come pick it up, or if he wanted [place] to mail it. I advised patient to call back to let us know, gave call back number.

## 2017-03-06 ENCOUNTER — Ambulatory Visit: Payer: Self-pay | Admitting: Internal Medicine

## 2017-05-19 ENCOUNTER — Ambulatory Visit: Payer: Self-pay | Admitting: Internal Medicine

## 2017-07-06 ENCOUNTER — Ambulatory Visit (INDEPENDENT_AMBULATORY_CARE_PROVIDER_SITE_OTHER): Payer: 59 | Admitting: Internal Medicine

## 2017-07-06 ENCOUNTER — Encounter: Payer: Self-pay | Admitting: Internal Medicine

## 2017-07-06 VITALS — BP 144/92 | HR 96 | Ht 71.0 in | Wt 245.0 lb

## 2017-07-06 DIAGNOSIS — E10319 Type 1 diabetes mellitus with unspecified diabetic retinopathy without macular edema: Secondary | ICD-10-CM

## 2017-07-06 LAB — POCT GLUCOSE (DEVICE FOR HOME USE): GLUCOSE FASTING, POC: 270 mg/dL — AB (ref 70–99)

## 2017-07-06 LAB — HEMOGLOBIN A1C: HEMOGLOBIN A1C: 11.6 % — AB (ref 4.6–6.5)

## 2017-07-06 MED ORDER — INSULIN LISPRO 100 UNIT/ML ~~LOC~~ SOLN: SUBCUTANEOUS | 11 refills | Status: DC

## 2017-07-06 NOTE — Patient Instructions (Addendum)
Please continue: - basal rates: 12 am: 1.7 4 am: 2.9 9 am: 2.2 10 pm: 2.3 - ICR:   12 am: 7 >> 5  6 pm: 6 >> 5 - target: 100-100 - ISF: 25 - Insulin on Board: 4h  Please check sugars and enter them in the pump, enter the carbs in the pump and bolus Humalog 10-15 min before every meal.  Please stop at the lab.  Please return in 3-4 months.

## 2017-07-06 NOTE — Progress Notes (Signed)
Patient ID: SHA AMER, male   DOB: 1985-08-28, 32 y.o.   MRN: [id number]   HPI: Barry Pittman is a 32 y.o.-year-old male, returning for follow-up forDM1, dx'ed at 32 y/o (1999), on insulin pump since 2000, uncontrolled, with  complications (DR).  He did not return to see me as advised, last visit was 7 months ago.  Last hemoglobin A1c was: Lab Results  Component Value Date   HGBA1C 11.9 12/06/2016   HGBA1C 10.6 06/21/2016   Pt is on an insulin pump:  1. Minimed Paradigm 722 since 2015: Without CGM, uses Humalog in the pump. 2. Medtronic 670G + CGM - beginning 05/2017 -he likes it   Pump settings: - basal rates: 12 am: 1.7 4 am: 2.9 9 am: 2.2 10 pm: 2.3 - ICR:   12 am: 7  6 pm: 6 - target: 100-100 - ISF: 25 - Insulin on Board: 4h - bolus wizard: on TDD from basal insulin: 87% >> 64% >> 64% TDD from bolus insulin: 13% >> 36% >> 36% TDD: 90 units a day - extended bolusing: not using - changes infusion site: Every 3-4 days now - Meter: Freestyle Lite  CGM parameters: - Average from CGM: 183+/-50. - Average from manual BG checks: 211+/-55 He checks his sugars 8.7 times a day and calibrates 3.5 times a day  Time in range:  - very low (40-50): 0% - low (50-70): 1% - normal range (70-180): 49% - high sugars (180-250): 40% - very high sugars (250-400): 10%  - in auto mode: 71 % - in manual mode: 29 %  Lowest sugar was 35 (years ago) >> 100 >> 44 (am - slept in); he has hypoglycemia awareness in the 80s.  No previous hypoglycemia admission.  He does have a non-expired glucagon kit at home.  Highest sugar was HI >> 234 >> 320 - tube obstruction. He had 2 DKA admissions: 1. 05/2015 - Flu and PNA. 2. 09/21/2016 - ? reason  Pt's meals are: - Breakfast: Mac Muffin - Lunch: Subway - Dinner: fast food - Snacks: yoghurt, cheese crackers, diet Mtn Dew  - No CKD, last BUN/creatinine:  Lab Results  Component Value Date   BUN 6 09/27/2016   BUN 6  09/26/2016   CREATININE 0.71 09/27/2016   CREATININE 0.72 09/26/2016  On lisinopril.  -No HL; last set of lipids: Lab Results  Component Value Date   CHOL 141 12/06/2016   HDL 51.70 12/06/2016   LDLCALC 68 12/06/2016   TRIG 107.0 12/06/2016   CHOLHDL 3 12/06/2016  Not on a statin.  - last eye exam was 10/2016: + DR.  Dr. Baird Cancer.  He has a history of retinal surgery.  - No numbness and tingling in his feet.  Last TSH was normal at last visit: Lab Results  Component Value Date   TSH 0.87 12/06/2016   Pt has FH of DM in M and F. Both died before 32 y/o.  He also has a history of HTN.  ROS: Constitutional: no weight gain/no weight loss, no fatigue, no subjective hyperthermia, no subjective hypothermia Eyes: no blurry vision, no xerophthalmia ENT: no sore throat, no nodules palpated in throat, no dysphagia, no odynophagia, no hoarseness Cardiovascular: no CP/no SOB/no palpitations/no leg swelling Respiratory: no cough/no SOB/no wheezing Gastrointestinal: no N/no V/no D/no C/no acid reflux Musculoskeletal: no muscle aches/no joint aches Skin: no rashes, no hair loss Neurological: no tremors/no numbness/no tingling/no dizziness  I reviewed pt's medications, allergies, PMH, social hx, family hx, and  changes were documented in the history of present illness. Otherwise, unchanged from my initial visit note.  Past Medical History:  Diagnosis Date  . Diabetes mellitus without complication ([place])   . Hypertension    Past Surgical History:  Procedure Laterality Date  . EYE SURGERY     For retinopathy    Social History   Social History  . Marital status: married    Spouse name: N/A  . Number of children: 2   Occupational History  . sales   Social History Main Topics  . Smoking status: Never Smoker  . Smokeless tobacco: Never Used  . Alcohol use Yes  . Drug use: No   Current Outpatient Medications on File Prior to Visit  Medication Sig Dispense Refill  . glucagon  (GLUCAGON EMERGENCY) 1 MG injection Inject 1 mg into the muscle once as needed. (Patient taking differently: Inject 1 mg into the muscle daily as needed (low blood sugar). ) 1 each 12  . glucose blood (FREESTYLE LITE) test strip Use as instructed to check sugar 4 times daily 400 each 5  . Insulin Human (INSULIN PUMP) SOLN Inject 1 each into the skin 3 times daily with meals, bedtime and 2 AM. 1 each 0  . insulin lispro (HUMALOG) 100 UNIT/ML injection Inject 86 units into the skin over 24 hours via pump 20 mL 3  . lisinopril (PRINIVIL,ZESTRIL) 20 MG tablet Take 1 tablet (20 mg total) by mouth daily. 30 tablet 3  . metoCLOPramide (REGLAN) 10 MG tablet Take 1 tablet (10 mg total) by mouth every 8 (eight) hours as needed for nausea. 20 tablet 0  . Multiple Vitamins-Minerals (MULTIVITAMIN ADULTS PO) Take 1 tablet by mouth daily.    . ondansetron (ZOFRAN) 4 MG tablet Take 1 tablet (4 mg total) by mouth daily as needed for nausea or vomiting. 20 tablet 0   No current facility-administered medications on file prior to visit.    Allergies  Allergen Reactions  . Novolog [Insulin Aspart]      Family History  Problem Relation Age of Onset  . Diabetes Mother   . Heart disease Mother   . Diabetes Father   . Heart disease Father    PE: BP (!) 144/92   Pulse 96   Ht 5' 11" (1.803 m)   Wt 245 lb (111.1 kg)   SpO2 97%   BMI 34.17 kg/m  Wt Readings from Last 3 Encounters:  07/06/17 245 lb (111.1 kg)  12/06/16 231 lb (104.8 kg)  09/25/16 225 lb 12 oz (102.4 kg)   Constitutional: overweight, in NAD Eyes: PERRLA, EOMI, no exophthalmos ENT: moist mucous membranes, no thyromegaly, no cervical lymphadenopathy Cardiovascular: Tachycardia, RR, No MRG Respiratory: CTA B Gastrointestinal: abdomen soft, NT, ND, BS+ Musculoskeletal: no deformities, strength intact in all 4 Skin: moist, warm, no rashes Neurological: no tremor with outstretched hands, DTR normal in all 4  ASSESSMENT: 1. DM1,  uncontrolled, with complications - DR  PLAN:  1. Patient with long-standing, uncontrolled diabetes, on insulin pump therapy.  He returns after a longer absence.  At last visit, his HbA1c was still very high, at 11.9%, however, his sugars improved in the previous 2 weeks before the appointment after he started to check his sugars more frequently, changing his infusion site every 3-4 days and bolusing more for meals.  At that time, I again advised him to check his sugars before every meal, entered him in the pump, and bolus 10-15 minutes before each meal.  We decreased  his insulin sensitivity factor then.  I also advised him to check with the [employer] representative whether he can change his pump. - At this visit, he returns for the first  visit after he changed his pump to the latest Medtronic, 670 G pump with integrated CGM.  He likes the pump but he has had some lows,  with the lowest being 44 at waking up from sleep after sleeping in.  He was able to recognize the low and corrected. - Reviewing his pump and CGM download today, I identified the following problems: 1. He is still sometimes bolusing after the start of the meal with subsequent hyper followed by hypoglycemia we discussed about the importance of moving all the boluses 15 minutes before meals.  2. Even if he enters the appropriate amount of carbs and boluses before a meal, his sugars may still increase significantly after a meal.  We will decrease his insulin to carb ratios today. 3. He is sometimes not entering the right amount of carbs; he is working on improving his carb counting.  4. Discussed about the need to calibrate the sensor 4 times a day.  He is currently doing it 2-3 times a day. 5. His basal-bolus ratios are still suboptimal: 64% - 36%, respectively.  Decreasing his ICRS will help with this. 6. He is not staying in the auto mode long enough: Only 71% of the time.  He is usually kicked out of the auto mode due to high blood  sugar or auto mode maximum delivery.  We discussed about correcting these. - I do not feel any other changes are necessary at this point, other than decreasing his ICRs  - He continues to work with Madaline Brilliant from [employer] - I advised him to: Patient Instructions  Please continue: - basal rates: 12 am: 1.7 4 am: 2.9 9 am: 2.2 10 pm: 2.3 - ICR:   12 am: 7 >> 5  6 pm: 6 >> 5 - target: 100-100 - ISF: 25 - Insulin on Board: 4h  Please check sugars and enter them in the pump, enter the carbs in the pump and bolus Humalog 10-15 min before every meal.  Please stop at the lab.  Please return in 3-4 months.  - today we will check his HbA1c - continue checking sugars at different times of the day - check >4x a day, rotating checks - advised for yearly eye exams >> he is UTD - At this visit, I filled out his DMV paperwork - Return to clinic in 3 mo with sugar log   - time spent with the patient: 35 min, of which >50% was spent in reviewing his pump downloads, discussing his hypo- and hyper-glycemic episodes, reviewing previous labs and pump settings and developing a plan to avoid hypo- and hyper-glycemia.   Philemon Kingdom, MD PhD [facility] Endocr[facility]

## 2017-07-12 ENCOUNTER — Telehealth: Payer: Self-pay

## 2017-07-12 NOTE — Telephone Encounter (Signed)
Called patient to ask if he ever received confirmation DMV forms faxed as requested. No answer. Forms in file on desk.

## 2017-07-13 ENCOUNTER — Telehealth: Payer: Self-pay | Admitting: Internal Medicine

## 2017-07-13 NOTE — Telephone Encounter (Signed)
Patient returned your call. Please call him at ph# [phone]

## 2017-07-17 NOTE — Telephone Encounter (Signed)
Spoke to patient. Advised have tried to fax DMV forms several times. Continue to receive "busy" response. Patient will pick forms up on lunch one day this week.

## 2017-11-07 ENCOUNTER — Ambulatory Visit: Payer: Self-pay | Admitting: Internal Medicine

## 2017-11-18 ENCOUNTER — Encounter (HOSPITAL_COMMUNITY): Payer: Self-pay

## 2017-11-18 ENCOUNTER — Other Ambulatory Visit: Payer: Self-pay

## 2017-11-18 ENCOUNTER — Inpatient Hospital Stay (HOSPITAL_COMMUNITY)
Admission: EM | Admit: 2017-11-18 | Discharge: 2017-11-23 | DRG: 638 | Disposition: A | Discharge status: Home / Self Care | Payer: 59 | Attending: Internal Medicine | Admitting: Internal Medicine

## 2017-11-18 DIAGNOSIS — Z79899 Other long term (current) drug therapy: Secondary | ICD-10-CM | POA: Diagnosis not present

## 2017-11-18 DIAGNOSIS — E860 Dehydration: Secondary | ICD-10-CM | POA: Diagnosis not present

## 2017-11-18 DIAGNOSIS — E1110 Type 2 diabetes mellitus with ketoacidosis without coma: Secondary | ICD-10-CM | POA: Diagnosis present

## 2017-11-18 DIAGNOSIS — I10 Essential (primary) hypertension: Secondary | ICD-10-CM | POA: Diagnosis present

## 2017-11-18 DIAGNOSIS — R112 Nausea with vomiting, unspecified: Secondary | ICD-10-CM | POA: Diagnosis present

## 2017-11-18 DIAGNOSIS — E871 Hypo-osmolality and hyponatremia: Secondary | ICD-10-CM | POA: Diagnosis not present

## 2017-11-18 DIAGNOSIS — Z794 Long term (current) use of insulin: Secondary | ICD-10-CM | POA: Diagnosis not present

## 2017-11-18 DIAGNOSIS — E1010 Type 1 diabetes mellitus with ketoacidosis without coma: Secondary | ICD-10-CM | POA: Diagnosis not present

## 2017-11-18 DIAGNOSIS — E875 Hyperkalemia: Secondary | ICD-10-CM | POA: Diagnosis present

## 2017-11-18 DIAGNOSIS — Z9111 Patient's noncompliance with dietary regimen: Secondary | ICD-10-CM | POA: Diagnosis not present

## 2017-11-18 DIAGNOSIS — D473 Essential (hemorrhagic) thrombocythemia: Secondary | ICD-10-CM | POA: Diagnosis not present

## 2017-11-18 DIAGNOSIS — D72829 Elevated white blood cell count, unspecified: Secondary | ICD-10-CM | POA: Diagnosis present

## 2017-11-18 DIAGNOSIS — Z9114 Patient's other noncompliance with medication regimen: Secondary | ICD-10-CM | POA: Diagnosis not present

## 2017-11-18 DIAGNOSIS — F1010 Alcohol abuse, uncomplicated: Secondary | ICD-10-CM | POA: Diagnosis present

## 2017-11-18 DIAGNOSIS — Z9641 Presence of insulin pump (external) (internal): Secondary | ICD-10-CM | POA: Diagnosis present

## 2017-11-18 DIAGNOSIS — N179 Acute kidney failure, unspecified: Secondary | ICD-10-CM | POA: Diagnosis not present

## 2017-11-18 DIAGNOSIS — K219 Gastro-esophageal reflux disease without esophagitis: Secondary | ICD-10-CM | POA: Diagnosis present

## 2017-11-18 DIAGNOSIS — D75839 Thrombocytosis, unspecified: Secondary | ICD-10-CM | POA: Diagnosis present

## 2017-11-18 DIAGNOSIS — Z833 Family history of diabetes mellitus: Secondary | ICD-10-CM

## 2017-11-18 LAB — GLUCOSE, CAPILLARY
GLUCOSE-CAPILLARY: 214 mg/dL — AB (ref 70–99)
Glucose-Capillary: 267 mg/dL — ABNORMAL HIGH (ref 70–99)

## 2017-11-18 LAB — URINALYSIS, ROUTINE W REFLEX MICROSCOPIC
Bacteria, UA: NONE SEEN
Bilirubin Urine: NEGATIVE
KETONES UR: 80 mg/dL — AB
LEUKOCYTES UA: NEGATIVE
NITRITE: NEGATIVE
PH: 5 (ref 5.0–8.0)
Protein, ur: 30 mg/dL — AB
Specific Gravity, Urine: 1.024 (ref 1.005–1.030)

## 2017-11-18 LAB — CBC WITH DIFFERENTIAL/PLATELET
BASOS ABS: 0 10*3/uL (ref 0.0–0.1)
BASOS ABS: 0 10*3/uL (ref 0.0–0.1)
BASOS PCT: 0 %
BASOS PCT: 0 %
EOS ABS: 0 10*3/uL (ref 0.0–0.7)
EOS PCT: 0 %
Eosinophils Absolute: 0 10*3/uL (ref 0.0–0.7)
Eosinophils Relative: 0 %
HEMATOCRIT: 51.9 % (ref 39.0–52.0)
HEMATOCRIT: 49.6 % (ref 39.0–52.0)
HEMOGLOBIN: 16.6 g/dL (ref 13.0–17.0)
Hemoglobin: 17.9 g/dL — ABNORMAL HIGH (ref 13.0–17.0)
LYMPHS ABS: 2.5 10*3/uL (ref 0.7–4.0)
LYMPHS PCT: 6 %
Lymphocytes Relative: 10 %
Lymphs Abs: 1.5 10*3/uL (ref 0.7–4.0)
MCH: 29.8 pg (ref 26.0–34.0)
MCH: 29.5 pg (ref 26.0–34.0)
MCHC: 34.5 g/dL (ref 30.0–36.0)
MCHC: 33.5 g/dL (ref 30.0–36.0)
MCV: 86.5 fL (ref 78.0–100.0)
MCV: 88.1 fL (ref 78.0–100.0)
MONO ABS: 1.2 10*3/uL — AB (ref 0.1–1.0)
MONO ABS: 0.5 10*3/uL (ref 0.1–1.0)
Monocytes Relative: 5 %
Monocytes Relative: 2 %
NEUTROS ABS: 21.6 10*3/uL — AB (ref 1.7–7.7)
NEUTROS PCT: 89 %
Neutro Abs: 21.5 10*3/uL — ABNORMAL HIGH (ref 1.7–7.7)
Neutrophils Relative %: 88 %
PLATELETS: 360 10*3/uL (ref 150–400)
Platelets: 345 10*3/uL (ref 150–400)
RBC: 6 MIL/uL — ABNORMAL HIGH (ref 4.22–5.81)
RBC: 5.63 MIL/uL (ref 4.22–5.81)
RDW: 12.8 % (ref 11.5–15.5)
RDW: 12.9 % (ref 11.5–15.5)
WBC: 24.2 10*3/uL — AB (ref 4.0–10.5)
WBC: 24.6 10*3/uL — ABNORMAL HIGH (ref 4.0–10.5)

## 2017-11-18 LAB — COMPREHENSIVE METABOLIC PANEL
ALT: 18 U/L (ref 0–44)
AST: 23 U/L (ref 15–41)
Albumin: 4.9 g/dL (ref 3.5–5.0)
Alkaline Phosphatase: 103 U/L (ref 38–126)
Anion gap: 28 — ABNORMAL HIGH (ref 5–15)
BUN: 32 mg/dL — AB (ref 6–20)
CHLORIDE: 94 mmol/L — AB (ref 98–111)
CO2: 8 mmol/L — ABNORMAL LOW (ref 22–32)
Calcium: 9 mg/dL (ref 8.9–10.3)
Creatinine, Ser: 1.6 mg/dL — ABNORMAL HIGH (ref 0.61–1.24)
GFR calc Af Amer: >60 mL/min (ref >60)
GFR calc non Af Amer: 56 mL/min — ABNORMAL LOW (ref >60)
GLUCOSE: 514 mg/dL — AB (ref 70–99)
POTASSIUM: 6 mmol/L — AB (ref 3.5–5.1)
Sodium: 130 mmol/L — ABNORMAL LOW (ref 135–145)
Total Bilirubin: 2.4 mg/dL — ABNORMAL HIGH (ref 0.3–1.2)
Total Protein: 8.5 g/dL — ABNORMAL HIGH (ref 6.5–8.1)

## 2017-11-18 LAB — CBG MONITORING, ED
GLUCOSE-CAPILLARY: 486 mg/dL — AB (ref 70–99)
GLUCOSE-CAPILLARY: 415 mg/dL — AB (ref 70–99)
GLUCOSE-CAPILLARY: 361 mg/dL — AB (ref 70–99)
Glucose-Capillary: 518 mg/dL (ref 70–99)
Glucose-Capillary: 296 mg/dL — ABNORMAL HIGH (ref 70–99)

## 2017-11-18 LAB — BASIC METABOLIC PANEL
Anion gap: 26 — ABNORMAL HIGH (ref 5–15)
Anion gap: 19 — ABNORMAL HIGH (ref 5–15)
BUN: 32 mg/dL — ABNORMAL HIGH (ref 6–20)
BUN: 27 mg/dL — AB (ref 6–20)
CALCIUM: 8.9 mg/dL (ref 8.9–10.3)
CALCIUM: 8.7 mg/dL — AB (ref 8.9–10.3)
CO2: 8 mmol/L — ABNORMAL LOW (ref 22–32)
CO2: 11 mmol/L — ABNORMAL LOW (ref 22–32)
CREATININE: 1.56 mg/dL — AB (ref 0.61–1.24)
Chloride: 104 mmol/L (ref 98–111)
Chloride: 109 mmol/L (ref 98–111)
Creatinine, Ser: 1.55 mg/dL — ABNORMAL HIGH (ref 0.61–1.24)
GFR calc Af Amer: >60 mL/min (ref >60)
GFR calc Af Amer: >60 mL/min (ref >60)
GFR, EST NON AFRICAN AMERICAN: 58 mL/min — AB (ref >60)
GFR, EST NON AFRICAN AMERICAN: 57 mL/min — AB (ref >60)
GLUCOSE: 415 mg/dL — AB (ref 70–99)
GLUCOSE: 270 mg/dL — AB (ref 70–99)
POTASSIUM: 5.3 mmol/L — AB (ref 3.5–5.1)
Potassium: 4.8 mmol/L (ref 3.5–5.1)
SODIUM: 138 mmol/L (ref 135–145)
SODIUM: 139 mmol/L (ref 135–145)

## 2017-11-18 LAB — BLOOD GAS, VENOUS
Acid-base deficit: 21.2 mmol/L — ABNORMAL HIGH (ref 0.0–2.0)
Bicarbonate: 8 mmol/L — ABNORMAL LOW (ref 20.0–28.0)
O2 Saturation: 74 %
PATIENT TEMPERATURE: 98.6
PH VEN: 7.123 — AB (ref 7.250–7.430)
pCO2, Ven: 25.5 mmHg — ABNORMAL LOW (ref 44.0–60.0)
pO2, Ven: 47.1 mmHg — ABNORMAL HIGH (ref 32.0–45.0)

## 2017-11-18 LAB — MAGNESIUM
MAGNESIUM: 2.6 mg/dL — AB (ref 1.7–2.4)
Magnesium: 2.5 mg/dL — ABNORMAL HIGH (ref 1.7–2.4)

## 2017-11-18 LAB — BETA-HYDROXYBUTYRIC ACID: Beta-Hydroxybutyric Acid: >8 mmol/L — ABNORMAL HIGH (ref 0.05–0.27)

## 2017-11-18 LAB — TROPONIN I: Troponin I: <0.03 ng/mL (ref <0.03)

## 2017-11-18 LAB — PHOSPHORUS: Phosphorus: 5.5 mg/dL — ABNORMAL HIGH (ref 2.5–4.6)

## 2017-11-18 LAB — LACTIC ACID, PLASMA: Lactic Acid, Venous: 2.8 mmol/L (ref 0.5–1.9)

## 2017-11-18 MED ORDER — SODIUM CHLORIDE 0.9 % IV SOLN
INTRAVENOUS | Status: DC
  Administered 2017-11-18: 4.3 [IU]/h via INTRAVENOUS
  Filled 2017-11-18: qty 1

## 2017-11-18 MED ORDER — MORPHINE SULFATE (PF) 4 MG/ML IV SOLN
4.0000 mg | Freq: Once | INTRAVENOUS | Status: AC
  Administered 2017-11-18: 4 mg via INTRAVENOUS
  Filled 2017-11-18: qty 1

## 2017-11-18 MED ORDER — ENOXAPARIN SODIUM 40 MG/0.4ML ~~LOC~~ SOLN
40.0000 mg | SUBCUTANEOUS | Status: DC
  Administered 2017-11-18: 40 mg via SUBCUTANEOUS
  Filled 2017-11-18: qty 0.4

## 2017-11-18 MED ORDER — SODIUM CHLORIDE 0.9 % IV SOLN
INTRAVENOUS | Status: DC
  Filled 2017-11-18 (×2): qty 1

## 2017-11-18 MED ORDER — MORPHINE SULFATE (PF) 2 MG/ML IV SOLN
1.0000 mg | INTRAVENOUS | Status: DC | PRN
  Administered 2017-11-19 – 2017-11-22 (×7): 1 mg via INTRAVENOUS
  Filled 2017-11-18 (×7): qty 1

## 2017-11-18 MED ORDER — ONDANSETRON HCL 4 MG/2ML IJ SOLN
4.0000 mg | Freq: Once | INTRAMUSCULAR | Status: AC
  Administered 2017-11-18: 4 mg via INTRAVENOUS
  Filled 2017-11-18: qty 2

## 2017-11-18 MED ORDER — LISINOPRIL 20 MG PO TABS
20.0000 mg | ORAL_TABLET | Freq: Every day | ORAL | Status: DC
  Administered 2017-11-21 – 2017-11-23 (×3): 20 mg via ORAL
  Filled 2017-11-18 (×4): qty 1
  Filled 2017-11-18: qty 2

## 2017-11-18 MED ORDER — ONDANSETRON HCL 4 MG/2ML IJ SOLN
4.0000 mg | Freq: Four times a day (QID) | INTRAMUSCULAR | Status: DC | PRN
  Administered 2017-11-19 – 2017-11-20 (×4): 4 mg via INTRAVENOUS
  Filled 2017-11-18 (×4): qty 2

## 2017-11-18 MED ORDER — DEXTROSE-NACL 5-0.45 % IV SOLN: INTRAVENOUS | Status: DC

## 2017-11-18 MED ORDER — SODIUM CHLORIDE 0.9 % IV BOLUS
2000.0000 mL | Freq: Once | INTRAVENOUS | Status: AC
  Administered 2017-11-18: 2000 mL via INTRAVENOUS

## 2017-11-18 MED ORDER — SODIUM CHLORIDE 0.9 % IV SOLN: INTRAVENOUS | Status: AC

## 2017-11-18 MED ORDER — SODIUM CHLORIDE 0.9 % IV BOLUS: 2000.0000 mL | Freq: Once | INTRAVENOUS | Status: DC

## 2017-11-18 MED ORDER — TRAZODONE HCL 50 MG PO TABS
50.0000 mg | ORAL_TABLET | Freq: Once | ORAL | Status: AC
  Administered 2017-11-18: 50 mg via ORAL
  Filled 2017-11-18: qty 1

## 2017-11-18 MED ORDER — SODIUM CHLORIDE 0.9 % IV SOLN
INTRAVENOUS | Status: DC
  Administered 2017-11-18 (×2): via INTRAVENOUS

## 2017-11-18 MED ORDER — DEXTROSE-NACL 5-0.45 % IV SOLN
INTRAVENOUS | Status: DC
  Administered 2017-11-18: 23:00:00 via INTRAVENOUS

## 2017-11-18 NOTE — H&P (Signed)
History and Physical  Barry Pittman OZH:[id number] DOB: 1985/12/26 DOA: 11/18/2017  Referring physician: Emergency room PCP: Patient, No Pcp Per  Outpatient Specialists: None Patient coming from: Home & is able to ambulate yes  Chief Complaint: DKA  HPI:  Barry Pittman is a 32 y.o. male with a PMHx of DM1 and HTN, who presents to the ED with complaints of concern for being in DKA.  Patient states that last night around 7 PM he started feeling nauseous and vomiting, he has had too numerous to count episodes of nonbloody nonbilious emesis.  He has not taken anything for this, and trying to drink anything makes it worse.  He states that his sugars are usually in the 170s however since last night they have been in the 260-320 range which is unusual for him.  He has an insulin pump but he is not sure of the rate of insulin; he bolused himself with an unknown amount of Humalog about 2hrs ago but still arrives with a CBG of 518.  He admits that he drank 8 beers yesterday and ate some barbecue which he is concerned could have caused his nausea and vomiting.  He does not have a PCP, but he sees Dr. Cruzita Lederer of [facility] for endocrinology care.  He denies fevers, chills, URI symptoms, cough, CP, SOB, abd pain, diarrhea/constipation, melena, hematochezia, hematemesis, hematuria, dysuria, myalgias, arthralgias, numbness, tingling, focal weakness, or any other complaints at this time. Denies recent travel, sick contacts, frequent NSAID use, or prior abd surgeries.   The history is provided by the patient and medical records. No language interpreter was used.    ED Course: Patient received 2 L of IV fluid insulin was started he was also given Zofran and morphine  Review of Systems:  Constitutional: Negative for chills and fever.  HENT: Negative for rhinorrhea and sore throat.   Respiratory: Negative for cough and shortness of breath.   Cardiovascular: Negative for chest pain.  Gastrointestinal:  Positive for nausea and vomiting. Negative for abdominal pain, blood in stool, constipation and diarrhea.  Genitourinary: Negative for dysuria and hematuria.  Musculoskeletal: Negative for arthralgias and myalgias.  Skin: Negative for color change.  Allergic/Immunologic: Positive for immunocompromised state (DM1).  Neurological: Negative for weakness and numbness.  Psychiatric/Behavioral: Negative for confusion.   All other systems reviewed and are negative for acute change except as noted in the HPI.     Past Medical History:  Diagnosis Date  . Diabetes mellitus without complication ([place])   . Hypertension    Past Surgical History:  Procedure Laterality Date  . EYE SURGERY     For retinopathy     Social History:  reports that he has never smoked. He has never used smokeless tobacco. He reports that he drinks alcohol. He reports that he does not use drugs.   Allergies  Allergen Reactions  . Novolog [Insulin Aspart] Other (See Comments)    "goes into DKA"    Family History  Problem Relation Age of Onset  . Diabetes Mother   . Heart disease Mother   . Diabetes Father   . Heart disease Father       Prior to Admission medications   Medication Sig Start Date End Date Taking? Authorizing Provider  glucagon (GLUCAGON EMERGENCY) 1 MG injection Inject 1 mg into the muscle once as needed. Patient taking differently: Inject 1 mg into the muscle daily as needed (low blood sugar).  06/20/16  Yes Philemon Kingdom, MD  glucose blood (FREESTYLE  LITE) test strip Use as instructed to check sugar 4 times daily 06/20/16  Yes Philemon Kingdom, MD  Insulin Human (INSULIN PUMP) SOLN Inject 1 each into the skin 3 times daily with meals, bedtime and 2 AM. Patient taking differently: Inject into the skin continuous. Humalog 09/27/16  Yes Theodis Blaze, MD  insulin lispro (HUMALOG) 100 UNIT/ML injection Inject 86 units into the skin over 24 hours via pump 07/06/17  Yes Philemon Kingdom, MD    lisinopril (PRINIVIL,ZESTRIL) 20 MG tablet Take 1 tablet (20 mg total) by mouth daily. 06/20/16  Yes Philemon Kingdom, MD    Physical Exam: BP (!) 144/78   Pulse (!) 122   Temp (!) 97 F (36.1 C) (Oral)   Resp 18   SpO2 97%    Physical Exam  Constitutional: He is oriented to person, place, and time. Vital signs are normal. He appears well-developed and well-nourished.  Non-toxic appearance. No distress.  Afebrile, nontoxic, NAD but appears to feel unwell  HENT:  Head: Normocephalic and atraumatic.  Mouth/Throat: Oropharynx is clear and moist. Mucous membranes are dry.  Very dry lips/mouth  Eyes: Conjunctivae and EOM are normal. Right eye exhibits no discharge. Left eye exhibits no discharge.  Neck: Normal range of motion. Neck supple.  Cardiovascular: Regular rhythm, normal heart sounds and intact distal pulses. Tachycardia present. Exam reveals no gallop and no friction rub.  No murmur heard. Tachycardic in the 110s during exam  Pulmonary/Chest: Effort normal and breath sounds normal. Tachypnea noted. No respiratory distress. He has no decreased breath sounds. He has no wheezes. He has no rhonchi. He has no rales.  Slight kussmaul respirations, tachypneic, otherwise CTAB in all lung fields, no w/r/r, no hypoxia or increased WOB  Abdominal: Soft. Normal appearance and bowel sounds are normal. He exhibits no distension. There is  slight epigastric tenderness. There is no rigidity, no rebound, no guarding, no CVA tenderness,   no tenderness at McBurney's point and negative Murphy's sign.  Soft, NTND, +BS throughout, no r/g/r, neg murphy's, neg mcburney's, no CVA TTP   Musculoskeletal: Normal range of motion.  Neurological: He is alert and oriented to person, place, and time. He has normal strength. No sensory deficit.  Skin: Skin is warm, dry and intact. No rash noted.  Psychiatric: He has a normal mood and affect.  Nursing note and vitals reviewed.           Labs on Admission:   Basic Metabolic Panel: Recent Labs  Lab 11/18/17 1553  NA 130*  K 6.0*  CL 94*  CO2 8*  GLUCOSE 514*  BUN 32*  CREATININE 1.60*  CALCIUM 9.0  MG 2.5*  PHOS 5.5*   Liver Function Tests: Recent Labs  Lab 11/18/17 1553  AST 23  ALT 18  ALKPHOS 103  BILITOT 2.4*  PROT 8.5*  ALBUMIN 4.9   No results for input(s): LIPASE, AMYLASE in the last 168 hours. No results for input(s): AMMONIA in the last 168 hours. CBC: Recent Labs  Lab 11/18/17 1439  WBC 24.2*  NEUTROABS 21.5*  HGB 17.9*  HCT 51.9  MCV 86.5  PLT 360   Cardiac Enzymes: No results for input(s): CKTOTAL, CKMB, CKMBINDEX, TROPONINI in the last 168 hours.  BNP (last 3 results) No results for input(s): BNP in the last 8760 hours.  ProBNP (last 3 results) No results for input(s): PROBNP in the last 8760 hours.  CBG: Recent Labs  Lab 11/18/17 1416 11/18/17 1613 11/18/17 1736  GLUCAP 518* 486* 415*  Radiological Exams on Admission: No results found.  EKG: . Date/Time:                  Saturday November 18 2017 15:47:31 EDT Ventricular Rate:         122 PR Interval:                   QRS Duration: 108 QT Interval:                 345 QTC Calculation:        492 R Axis:                         93 Text Interpretation:       Sinus tachycardia Borderline right axis deviation Prolonged QT      Assessment/Plan Present on Admission: **None**  Active Problems:   * No active hospital problems. *    Diabetic ketoacidosis without, associated with type 1 diabetes mellitus Nausea and vomiting secondary to DKA Hypokalemia Acute kidney injury Leukocytosis Hyponatremia Hyperbilirubinemia Dehydration causing DKA Alcohol abuse with binge causing DKA We will admit to inpatient stepdown unit.  DKA protocol started aggressive IV bolus insulin drip will replace electrolytes  DVT prophylaxis: Lovenox  Code Status: Full  Family Communication: No family at bedside  Disposition Plan: To be  determined  Consults called: None  Admission status: Inpatient stepdown unit    Cristal Deer MD [facility] Pager [phone]  If 7PM-7AM, please contact night-coverage [email or web address] Password [facility]  11/18/2017, 5:41 PM

## 2017-11-18 NOTE — ED Notes (Signed)
Pt aware UA needed. 

## 2017-11-18 NOTE — ED Notes (Signed)
ED TO INPATIENT HANDOFF REPORT  Name/Age/Gender Barry Pittman 32 y.o. male  Code Status Code Status History    Date Active Date Inactive Code Status Order ID Comments User Context   09/22/2016 0010 09/27/2016 1942 Full Code [id number]  Rise Patience, MD ED   05/26/2015 1213 05/31/2015 1859 Full Code [id number]  Donita Brooks, NP ED      Home/SNF/Other Home  Chief Complaint n-v diabetic  Level of Care/Admitting Diagnosis ED Disposition    ED Disposition Condition Comment   Admit  The patient appears reasonably stabilized for admission considering the current resources, flow, and capabilities available in the ED at this time, and I doubt any other [facility] requiring further screening and/or treatment in the ED prior to admission is  present.       Medical History Past Medical History:  Diagnosis Date  . Diabetes mellitus without complication ([place])   . Hypertension     Allergies Allergies  Allergen Reactions  . Novolog [Insulin Aspart] Other (See Comments)    "goes into DKA"    IV Location/Drains/Wounds Patient Lines/Drains/Airways Status   Active Line/Drains/Airways    Name:   Placement date:   Placement time:   Site:   Days:   Peripheral IV 11/18/17 Right Antecubital   11/18/17    1607    Antecubital   less than 1   Peripheral IV 11/18/17 Right Hand   11/18/17    1440    Hand   less than 1   Wound / Incision (Open or Dehisced) 05/26/15 Other (Comment) Leg Left;Posterior reddened area size of quarter, and hot to touch.   05/26/15    1500    Leg   907          Labs/Imaging Results for orders placed or performed during the hospital encounter of 11/18/17 (from the past 48 hour(s))  CBG monitoring, ED     Status: Abnormal   Collection Time: 11/18/17  2:16 PM  Result Value Ref Range   Glucose-Capillary 518 (HH) 70 - 99 mg/dL  Urinalysis, Routine w reflex microscopic     Status: Abnormal   Collection Time: 11/18/17  2:32 PM  Result Value Ref Range   Color,  Urine YELLOW YELLOW   APPearance CLEAR CLEAR   Specific Gravity, Urine 1.024 1.005 - 1.030   pH 5.0 5.0 - 8.0   Glucose, UA >=500 (A) NEGATIVE mg/dL   Hgb urine dipstick SMALL (A) NEGATIVE   Bilirubin Urine NEGATIVE NEGATIVE   Ketones, ur 80 (A) NEGATIVE mg/dL   Protein, ur 30 (A) NEGATIVE mg/dL   Nitrite NEGATIVE NEGATIVE   Leukocytes, UA NEGATIVE NEGATIVE   RBC / HPF 0-5 0 - 5 RBC/hpf   WBC, UA 0-5 0 - 5 WBC/hpf   Bacteria, UA NONE SEEN NONE SEEN    Comment: Performed at [facility], [place] [address] [place], [place] [address]  Blood gas, venous     Status: Abnormal   Collection Time: 11/18/17  2:35 PM  Result Value Ref Range   pH, Ven 7.123 (LL) 7.250 - 7.430    Comment: RBV DR.ZAMMIT AT 1449 BY T.BURGESS,RRT,RCP ON 11/18/2017    pCO2, Ven 25.5 (L) 44.0 - 60.0 mmHg   pO2, Ven 47.1 (H) 32.0 - 45.0 mmHg   Bicarbonate 8.0 (L) 20.0 - 28.0 mmol/L   Acid-base deficit 21.2 (H) 0.0 - 2.0 mmol/L   O2 Saturation 74.0 %   Patient temperature 98.6    Collection site VEIN  Drawn by [place]    Sample type VENOUS     Comment: Performed at [facility], [place] [address], [place], [place] [address]  CBC with Differential     Status: Abnormal   Collection Time: 11/18/17  2:39 PM  Result Value Ref Range   WBC 24.2 (H) 4.0 - 10.5 K/uL   RBC 6.00 (H) 4.22 - 5.81 MIL/uL   Hemoglobin 17.9 (H) 13.0 - 17.0 g/dL   HCT 51.9 39.0 - 52.0 %   MCV 86.5 78.0 - 100.0 fL   MCH 29.8 26.0 - 34.0 pg   MCHC 34.5 30.0 - 36.0 g/dL   RDW 12.8 11.5 - 15.5 %   Platelets 360 150 - 400 K/uL   Neutrophils Relative % 89 %   Lymphocytes Relative 6 %   Monocytes Relative 5 %   Eosinophils Relative 0 %   Basophils Relative 0 %   Neutro Abs 21.5 (H) 1.7 - 7.7 K/uL   Lymphs Abs 1.5 0.7 - 4.0 K/uL   Monocytes Absolute 1.2 (H) 0.1 - 1.0 K/uL   Eosinophils Absolute 0.0 0.0 - 0.7 K/uL   Basophils Absolute 0.0 0.0 - 0.1 K/uL   RBC Morphology POLYCHROMASIA PRESENT     WBC Morphology SPECIMEN CHECKED FOR CLOTS    Smear Review PLATELET COUNT CONFIRMED BY SMEAR     Comment: Performed at [facility], [place] [address] [place], [place] [address]  Comprehensive metabolic panel     Status: Abnormal   Collection Time: 11/18/17  3:53 PM  Result Value Ref Range   Sodium 130 (L) 135 - 145 mmol/L   Potassium 6.0 (H) 3.5 - 5.1 mmol/L   Chloride 94 (L) 98 - 111 mmol/L   CO2 8 (L) 22 - 32 mmol/L   Glucose, Bld 514 (HH) 70 - 99 mg/dL    Comment: CRITICAL RESULT CALLED TO, READ BACK BY AND VERIFIED WITH: M OWENS,RN 11/18/17 1633 RHOLMES    BUN 32 (H) 6 - 20 mg/dL   Creatinine, Ser 1.60 (H) 0.61 - 1.24 mg/dL   Calcium 9.0 8.9 - 10.3 mg/dL   Total Protein 8.5 (H) 6.5 - 8.1 g/dL   Albumin 4.9 3.5 - 5.0 g/dL   AST 23 15 - 41 U/L   ALT 18 0 - 44 U/L   Alkaline Phosphatase 103 38 - 126 U/L   Total Bilirubin 2.4 (H) 0.3 - 1.2 mg/dL   GFR calc non Af Amer 56 (L) >60 mL/min   GFR calc Af Amer >60 >60 mL/min    Comment: (NOTE) The eGFR has been calculated using the CKD EPI equation. This calculation has not been validated in all clinical situations. eGFR's persistently <60 mL/min signify possible Chronic Kidney Disease.    Anion gap 28 (H) 5 - 15    Comment: REPEATED TO VERIFY Performed at [facility], [place] [address] [place], [place] [address]   Magnesium     Status: Abnormal   Collection Time: 11/18/17  3:53 PM  Result Value Ref Range   Magnesium 2.5 (H) 1.7 - 2.4 mg/dL    Comment: Performed at [facility], [place] [address] [place], [place] [address]  Phosphorus     Status: Abnormal   Collection Time: 11/18/17  3:53 PM  Result Value Ref Range   Phosphorus 5.5 (H) 2.5 - 4.6 mg/dL    Comment: Performed at [facility], [place] [address], [place], [place] [address]  Lactic acid,  plasma     Status: Abnormal   Collection Time: 11/18/17  3:53 PM  Result Value Ref Range   Lactic Acid, Venous  2.8 (HH) 0.5 - 1.9 mmol/L    Comment: CRITICAL RESULT CALLED TO, READ BACK BY AND VERIFIED WITH: Tula Nakayama 11/18/17 1633 RHOLMES Performed at [facility], [place] [address] [place], [place] [address]   POC CBG, ED     Status: Abnormal   Collection Time: 11/18/17  4:13 PM  Result Value Ref Range   Glucose-Capillary 486 (H) 70 - 99 mg/dL   No results found.  Pending Labs Unresulted Labs (From admission, onward)   None      Vitals/Pain Today's Vitals   11/18/17 1630 11/18/17 1632 11/18/17 1632 11/18/17 1700  BP: 138/71   (!) 146/73  Pulse: (!) 121   (!) 122  Resp: 18   (!) 24  Temp:      TempSrc:      SpO2: 100%   100%  PainSc:  Asleep Asleep     Isolation Precautions No active isolations  Medications Medications  dextrose 5 %-0.45 % sodium chloride infusion ( Intravenous Hold 11/18/17 1556)  insulin regular (NOVOLIN R,HUMULIN R) 100 Units in sodium chloride 0.9 % 100 mL (1 Units/mL) infusion ( Intravenous Rate/Dose Verify 11/18/17 1635)  sodium chloride 0.9 % bolus 2,000 mL ( Intravenous Rate/Dose Verify 11/18/17 1635)  ondansetron (ZOFRAN) injection 4 mg (4 mg Intravenous Given 11/18/17 1447)  morphine 4 MG/ML injection 4 mg (4 mg Intravenous Given 11/18/17 1606)    Mobility walks

## 2017-11-18 NOTE — ED Provider Notes (Signed)
[facility] DEPT Provider Note   CSN: [id number] Arrival date & time: 11/18/17  1408     History   Chief Complaint Chief Complaint  Patient presents with  . Hyperglycemia  . Emesis    HPI Barry Pittman is a 32 y.o. male with a PMHx of DM1 and HTN, who presents to the ED with complaints of concern for being in DKA.  Patient states that last night around 7 PM he started feeling nauseous and vomiting, he has had too numerous to count episodes of nonbloody nonbilious emesis.  He has not taken anything for this, and trying to drink anything makes it worse.  He states that his sugars are usually in the 170s however since last night they have been in the 260-320 range which is unusual for him.  He has an insulin pump but he is not sure of the rate of insulin; he bolused himself with an unknown amount of Humalog about 2hrs ago but still arrives with a CBG of 518.  He admits that he drank 8 beers yesterday and ate some barbecue which he is concerned could have caused his nausea and vomiting.  He does not have a PCP, but he sees Dr. Cruzita Lederer of [facility] for endocrinology care.  He denies fevers, chills, URI symptoms, cough, CP, SOB, abd pain, diarrhea/constipation, melena, hematochezia, hematemesis, hematuria, dysuria, myalgias, arthralgias, numbness, tingling, focal weakness, or any other complaints at this time. Denies recent travel, sick contacts, frequent NSAID use, or prior abd surgeries.   The history is provided by the patient and medical records. No language interpreter was used.  Hyperglycemia  Associated symptoms: nausea and vomiting   Associated symptoms: no abdominal pain, no chest pain, no confusion, no dysuria, no fever, no shortness of breath and no weakness   Emesis   Pertinent negatives include no abdominal pain, no arthralgias, no chills, no cough, no diarrhea, no fever and no myalgias.    Past Medical History:  Diagnosis Date  . Diabetes  mellitus without complication ([place])   . Hypertension     Patient Active Problem List   Diagnosis Date Noted  . Chest pain 09/22/2016  . DKA, type 1 ([place]) 09/22/2016  . 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 Hyperkalemia 05/27/2015  . Hyponatremia 05/27/2015  . Acute kidney injury ([place]) 05/27/2015  . Thrombocytosis ([place]) 05/27/2015  . Leukocytosis 05/27/2015  . Intractable nausea and vomiting 05/27/2015  . Acute respiratory failure with hypoxia ([place]) 05/27/2015  . DKA (diabetic ketoacidoses) ([place]) 05/26/2015    Past Surgical History:  Procedure Laterality Date  . EYE SURGERY     For retinopathy         Home Medications    Prior to Admission medications   Medication Sig Start Date End Date Taking? Authorizing Provider  glucagon (GLUCAGON EMERGENCY) 1 MG injection Inject 1 mg into the muscle once as needed. Patient taking differently: Inject 1 mg into the muscle daily as needed (low blood sugar).  06/20/16   Philemon Kingdom, MD  glucose blood (FREESTYLE LITE) test strip Use as instructed to check sugar 4 times daily 06/20/16   Philemon Kingdom, MD  Insulin Human (INSULIN PUMP) SOLN Inject 1 each into the skin 3 times daily with meals, bedtime and 2 AM. 09/27/16   Theodis Blaze, MD  insulin lispro (HUMALOG) 100 UNIT/ML injection Inject 86 units into the skin over 24 hours via pump 07/06/17   Philemon Kingdom, MD  lisinopril (PRINIVIL,ZESTRIL) 20  MG tablet Take 1 tablet (20 mg total) by mouth daily. 06/20/16   Philemon Kingdom, MD  Multiple Vitamins-Minerals (MULTIVITAMIN ADULTS PO) Take 1 tablet by mouth daily.    [provider]    Family History Family History  Problem Relation Age of Onset  . Diabetes Mother   . Heart disease Mother   . Diabetes Father   . Heart disease Father     Social History Social History   Tobacco Use  . Smoking status: Never Smoker  . Smokeless tobacco: Never Used  Substance Use Topics  . Alcohol use: Yes    . Drug use: No     Allergies   Novolog [insulin aspart]   Review of Systems Review of Systems  Constitutional: Negative for chills and fever.  HENT: Negative for rhinorrhea and sore throat.   Respiratory: Negative for cough and shortness of breath.   Cardiovascular: Negative for chest pain.  Gastrointestinal: Positive for nausea and vomiting. Negative for abdominal pain, blood in stool, constipation and diarrhea.  Genitourinary: Negative for dysuria and hematuria.  Musculoskeletal: Negative for arthralgias and myalgias.  Skin: Negative for color change.  Allergic/Immunologic: Positive for immunocompromised state (DM1).  Neurological: Negative for weakness and numbness.  Psychiatric/Behavioral: Negative for confusion.   All other systems reviewed and are negative for acute change except as noted in the HPI.    Physical Exam Updated Vital Signs BP 130/82   Pulse (!) 116   Temp (!) 97 F (36.1 C) (Oral)   Resp (!) 28   SpO2 99%     Physical Exam  Constitutional: He is oriented to person, place, and time. Vital signs are normal. He appears well-developed and well-nourished.  Non-toxic appearance. No distress.  Afebrile, nontoxic, NAD but appears to feel unwell  HENT:  Head: Normocephalic and atraumatic.  Mouth/Throat: Oropharynx is clear and moist. Mucous membranes are dry.  Very dry lips/mouth  Eyes: Conjunctivae and EOM are normal. Right eye exhibits no discharge. Left eye exhibits no discharge.  Neck: Normal range of motion. Neck supple.  Cardiovascular: Regular rhythm, normal heart sounds and intact distal pulses. Tachycardia present. Exam reveals no gallop and no friction rub.  No murmur heard. Tachycardic in the 110s during exam  Pulmonary/Chest: Effort normal and breath sounds normal. Tachypnea noted. No respiratory distress. He has no decreased breath sounds. He has no wheezes. He has no rhonchi. He has no rales.  Slight kussmaul respirations, tachypneic, otherwise  CTAB in all lung fields, no w/r/r, no hypoxia or increased WOB  Abdominal: Soft. Normal appearance and bowel sounds are normal. He exhibits no distension. There is no tenderness. There is no rigidity, no rebound, no guarding, no CVA tenderness, no tenderness at McBurney's point and negative Murphy's sign.  Soft, NTND, +BS throughout, no r/g/r, neg murphy's, neg mcburney's, no CVA TTP   Musculoskeletal: Normal range of motion.  Neurological: He is alert and oriented to person, place, and time. He has normal strength. No sensory deficit.  Skin: Skin is warm, dry and intact. No rash noted.  Psychiatric: He has a normal mood and affect.  Nursing note and vitals reviewed.    ED Treatments / Results  Labs (all labs ordered are listed, but only abnormal results are displayed) Labs Reviewed  BLOOD GAS, VENOUS - Abnormal; Notable for the following components:      Result Value   pH, Ven 7.123 (*)    pCO2, Ven 25.5 (*)    pO2, Ven 47.1 (*)  Bicarbonate 8.0 (*)    Acid-base deficit 21.2 (*)    All other components within normal limits  CBC WITH DIFFERENTIAL/PLATELET - Abnormal; Notable for the following components:   WBC 24.2 (*)    RBC 6.00 (*)    Hemoglobin 17.9 (*)    Neutro Abs 21.5 (*)    Monocytes Absolute 1.2 (*)    All other components within normal limits  URINALYSIS, ROUTINE W REFLEX MICROSCOPIC - Abnormal; Notable for the following components:   Glucose, UA >=500 (*)    Hgb urine dipstick SMALL (*)    Ketones, ur 80 (*)    Protein, ur 30 (*)    All other components within normal limits  COMPREHENSIVE METABOLIC PANEL - Abnormal; Notable for the following components:   Sodium 130 (*)    Potassium 6.0 (*)    Chloride 94 (*)    CO2 8 (*)    Glucose, Bld 514 (*)    BUN 32 (*)    Creatinine, Ser 1.60 (*)    Total Protein 8.5 (*)    Total Bilirubin 2.4 (*)    GFR calc non Af Amer 56 (*)    Anion gap 28 (*)    All other components within normal limits  MAGNESIUM - Abnormal;  Notable for the following components:   Magnesium 2.5 (*)    All other components within normal limits  PHOSPHORUS - Abnormal; Notable for the following components:   Phosphorus 5.5 (*)    All other components within normal limits  LACTIC ACID, PLASMA - Abnormal; Notable for the following components:   Lactic Acid, Venous 2.8 (*)    All other components within normal limits  CBG MONITORING, ED - Abnormal; Notable for the following components:   Glucose-Capillary 518 (*)    All other components within normal limits  CBG MONITORING, ED - Abnormal; Notable for the following components:   Glucose-Capillary 486 (*)    All other components within normal limits  I-STAT CHEM 8, ED    EKG EKG Interpretation  Date/Time:  Saturday November 18 2017 15:47:31 EDT Ventricular Rate:  122 PR Interval:    QRS Duration: 108 QT Interval:  345 QTC Calculation: 492 R Axis:   93 Text Interpretation:  Sinus tachycardia Borderline right axis deviation Prolonged QT interval Confirmed by Milton Ferguson [phone]) on 11/18/2017 3:52:16 PM   Radiology No results found.  Procedures Procedures (including critical care time)  CRITICAL CARE--DKA Performed by: Reece Agar   Total critical care time: 55 minutes  Critical care time was exclusive of separately billable procedures and treating other patients.  Critical care was necessary to treat or prevent imminent or life-threatening deterioration.  Critical care was time spent personally by me on the following activities: development of treatment plan with patient and/or surrogate as well as nursing, discussions with consultants, evaluation of patient's response to treatment, examination of patient, obtaining history from patient or surrogate, ordering and performing treatments and interventions, ordering and review of laboratory studies, ordering and review of radiographic studies, pulse oximetry and re-evaluation of patient's condition.   Medications  Ordered in ED Medications  dextrose 5 %-0.45 % sodium chloride infusion ( Intravenous Hold 11/18/17 1556)  insulin regular (NOVOLIN R,HUMULIN R) 100 Units in sodium chloride 0.9 % 100 mL (1 Units/mL) infusion ( Intravenous Rate/Dose Verify 11/18/17 1720)  sodium chloride 0.9 % bolus 2,000 mL (0 mLs Intravenous Stopped 11/18/17 1720)  ondansetron (ZOFRAN) injection 4 mg (4 mg Intravenous Given 11/18/17 1447)  morphine 4  MG/ML injection 4 mg (4 mg Intravenous Given 11/18/17 1606)     Initial Impression / Assessment and Plan / ED Course  I have reviewed the triage vital signs and the nursing notes.  Pertinent labs & imaging results that were available during my care of the patient were reviewed by me and considered in my medical decision making (see chart for details).     32 y.o. male here with suspected DKA, reports n/v since yesterday and sugars going up. On arrival, CBG 518. On exam, pt with slight kussmaul respirations, tachycardic, dry lips, no abdominal tenderness, clear lung exam. Will get labs to see if he is in DKA, will give fluids to start and zofran, and reassess. Doubt need for imaging at this time.   3:29 PM CBC w/diff with leukocytosis WBC 24.2, somewhat hemoconcentrated but will get lactic. VBG with pH 7.123, however still waiting on chem 8 or CMP; will attempt to find out what happened with these, but will proceed with starting glucostabilizer. Pt's pump turned off. Will reassess shortly. Discussed case with my attending Dr. Roderic Palau who agrees with plan.   3:42 PM  Chem 8 not crossing over, shows Na 127, Cl 101, bicarb 10 which calculates out to anion gap of 16; K also reported as >8.5; gluc 528, BUN 54, Cr 1.1. Apparently the labs hemolyzed, which is why we don't have a CMP/Mg/Phos result yet. Nursing staff working on getting these done now. Will await CMP result to get clear picture of his electrolytes before admission. Pt also reporting some epigastric pain, will give some pain meds and  reassess.   4:39 PM CMP with Na 130, K 6.0, Cl 94, bicarb 8, gluc 514, BUN 32/Cr 1.6, Tbili 2.4, anion gap 28; the potassium should come down with the insulin drip, will hold off on other tx right now; his Tbili could be elevated from his dehydration/AKI. Lactic mildly elevated at 2.8 likely from DKA/dehydration. Mg level marginally elevated at 2.5. Phos level mildly elevated at 5.5. U/A with 80 ketones, no evidence of infection. EKG with sinus tachycardia and borderline long QT, otherwise no ischemic findings. Pt vomited right after morphine was given, but denies feeling nauseated now, states he feels better, declines wanting anything more for now. Will proceed with admission.   5:21 PM Dr. Kyung Bacca of [facility] returning page and will admit. Holding orders to be placed by admitting team. Please see their notes for further documentation of care. I appreciate their help with this pleasant pt's care. Pt stable at time of admission.     Final Clinical Impressions(s) / ED Diagnoses   Final diagnoses:  Diabetic ketoacidosis without coma associated with type 1 diabetes mellitus (HCC)  Nausea and vomiting in adult patient  Hyperkalemia  AKI (acute kidney injury) ([place])  Leukocytosis, unspecified type  Hyperbilirubinemia  Dehydration    ED Discharge Orders    [address] [place], [place] 11/18/17 1721    Milton Ferguson, MD 11/19/17 [phone]

## 2017-11-18 NOTE — ED Notes (Signed)
Pt has turned off insulin pump. He has been provided with ice chips.

## 2017-11-18 NOTE — ED Triage Notes (Signed)
He states "I think I'm in DKA". He is actively vomiting at triage. He is unwilling or unable to tell my why he may be in DKA as he tells me he has an insulin pump. He is in no distress.

## 2017-11-19 LAB — BASIC METABOLIC PANEL
Anion gap: 13 (ref 5–15)
Anion gap: 11 (ref 5–15)
Anion gap: 17 — ABNORMAL HIGH (ref 5–15)
BUN: 24 mg/dL — AB (ref 6–20)
BUN: 22 mg/dL — AB (ref 6–20)
BUN: 18 mg/dL (ref 6–20)
CHLORIDE: 112 mmol/L — AB (ref 98–111)
CHLORIDE: 110 mmol/L (ref 98–111)
CO2: 13 mmol/L — AB (ref 22–32)
CO2: 19 mmol/L — ABNORMAL LOW (ref 22–32)
CO2: 14 mmol/L — ABNORMAL LOW (ref 22–32)
CREATININE: 1.15 mg/dL (ref 0.61–1.24)
CREATININE: 1.05 mg/dL (ref 0.61–1.24)
CREATININE: 1.11 mg/dL (ref 0.61–1.24)
Calcium: 8.5 mg/dL — ABNORMAL LOW (ref 8.9–10.3)
Calcium: 9.2 mg/dL (ref 8.9–10.3)
Calcium: 9.1 mg/dL (ref 8.9–10.3)
Chloride: 104 mmol/L (ref 98–111)
GFR calc Af Amer: >60 mL/min (ref >60)
GFR calc Af Amer: >60 mL/min (ref >60)
GFR calc Af Amer: >60 mL/min (ref >60)
GFR calc non Af Amer: >60 mL/min (ref >60)
GFR calc non Af Amer: >60 mL/min (ref >60)
GFR calc non Af Amer: >60 mL/min (ref >60)
Glucose, Bld: 192 mg/dL — ABNORMAL HIGH (ref 70–99)
Glucose, Bld: 142 mg/dL — ABNORMAL HIGH (ref 70–99)
Glucose, Bld: 263 mg/dL — ABNORMAL HIGH (ref 70–99)
Potassium: 5.4 mmol/L — ABNORMAL HIGH (ref 3.5–5.1)
Potassium: 4.3 mmol/L (ref 3.5–5.1)
Potassium: 4.6 mmol/L (ref 3.5–5.1)
SODIUM: 138 mmol/L (ref 135–145)
SODIUM: 140 mmol/L (ref 135–145)
SODIUM: 135 mmol/L (ref 135–145)

## 2017-11-19 LAB — GLUCOSE, CAPILLARY
GLUCOSE-CAPILLARY: 130 mg/dL — AB (ref 70–99)
GLUCOSE-CAPILLARY: 133 mg/dL — AB (ref 70–99)
GLUCOSE-CAPILLARY: 133 mg/dL — AB (ref 70–99)
GLUCOSE-CAPILLARY: 176 mg/dL — AB (ref 70–99)
GLUCOSE-CAPILLARY: 204 mg/dL — AB (ref 70–99)
GLUCOSE-CAPILLARY: 208 mg/dL — AB (ref 70–99)
Glucose-Capillary: 190 mg/dL — ABNORMAL HIGH (ref 70–99)
Glucose-Capillary: 173 mg/dL — ABNORMAL HIGH (ref 70–99)
Glucose-Capillary: 138 mg/dL — ABNORMAL HIGH (ref 70–99)
Glucose-Capillary: 136 mg/dL — ABNORMAL HIGH (ref 70–99)
Glucose-Capillary: 188 mg/dL — ABNORMAL HIGH (ref 70–99)
Glucose-Capillary: 195 mg/dL — ABNORMAL HIGH (ref 70–99)
Glucose-Capillary: 252 mg/dL — ABNORMAL HIGH (ref 70–99)

## 2017-11-19 LAB — HEMOGLOBIN A1C
Hgb A1c MFr Bld: 12.6 % — ABNORMAL HIGH (ref 4.8–5.6)
MEAN PLASMA GLUCOSE: 314.92 mg/dL

## 2017-11-19 LAB — MRSA PCR SCREENING: MRSA BY PCR: NEGATIVE

## 2017-11-19 LAB — TROPONIN I

## 2017-11-19 LAB — HIV ANTIBODY (ROUTINE TESTING W REFLEX): HIV Screen 4th Generation wRfx: NONREACTIVE

## 2017-11-19 MED ORDER — PROMETHAZINE HCL 25 MG/ML IJ SOLN
25.0000 mg | Freq: Four times a day (QID) | INTRAMUSCULAR | Status: DC | PRN
  Administered 2017-11-19: 25 mg via INTRAVENOUS
  Filled 2017-11-19: qty 1

## 2017-11-19 MED ORDER — PROMETHAZINE HCL 25 MG/ML IJ SOLN
6.2500 mg | Freq: Four times a day (QID) | INTRAMUSCULAR | Status: DC | PRN
  Administered 2017-11-19 – 2017-11-21 (×6): 6.25 mg via INTRAVENOUS
  Filled 2017-11-19 (×6): qty 1

## 2017-11-19 MED ORDER — INSULIN GLARGINE 100 UNIT/ML ~~LOC~~ SOLN
35.0000 [IU] | Freq: Every day | SUBCUTANEOUS | Status: DC
Start: 2017-11-20 — End: 2017-11-20
  Filled 2017-11-19: qty 0.35

## 2017-11-19 MED ORDER — INSULIN ASPART 100 UNIT/ML ~~LOC~~ SOLN
0.0000 [IU] | Freq: Every day | SUBCUTANEOUS | Status: DC
  Administered 2017-11-19: 2 [IU] via SUBCUTANEOUS

## 2017-11-19 MED ORDER — PANTOPRAZOLE SODIUM 40 MG PO TBEC
40.0000 mg | DELAYED_RELEASE_TABLET | Freq: Every day | ORAL | Status: DC
  Administered 2017-11-19 – 2017-11-23 (×4): 40 mg via ORAL
  Filled 2017-11-19 (×5): qty 1

## 2017-11-19 MED ORDER — INSULIN ASPART 100 UNIT/ML ~~LOC~~ SOLN
0.0000 [IU] | Freq: Three times a day (TID) | SUBCUTANEOUS | Status: DC
  Administered 2017-11-19: 8 [IU] via SUBCUTANEOUS
  Administered 2017-11-19: 3 [IU] via SUBCUTANEOUS
  Administered 2017-11-20: 11 [IU] via SUBCUTANEOUS

## 2017-11-19 MED ORDER — INSULIN GLARGINE 100 UNIT/ML ~~LOC~~ SOLN
25.0000 [IU] | Freq: Every day | SUBCUTANEOUS | Status: DC
  Administered 2017-11-19: 25 [IU] via SUBCUTANEOUS
  Filled 2017-11-19: qty 0.25

## 2017-11-19 MED ORDER — ENOXAPARIN SODIUM 40 MG/0.4ML ~~LOC~~ SOLN
40.0000 mg | SUBCUTANEOUS | Status: DC
  Administered 2017-11-19 – 2017-11-22 (×4): 40 mg via SUBCUTANEOUS
  Filled 2017-11-19 (×4): qty 0.4

## 2017-11-19 MED ORDER — INSULIN GLARGINE 100 UNIT/ML ~~LOC~~ SOLN
10.0000 [IU] | Freq: Once | SUBCUTANEOUS | Status: AC
  Administered 2017-11-19: 10 [IU] via SUBCUTANEOUS
  Filled 2017-11-19: qty 0.1

## 2017-11-19 NOTE — Progress Notes (Signed)
PROGRESS NOTE    Barry Pittman  YBW:[id number] DOB: Apr 08, 1986 DOA: 11/18/2017 PCP: Patient, No Pcp Per   Brief Narrative:  Patient is a 32 year old male with past medical history of diabetes type 1, hypertension, alcohol abuse who presents to the emergency department with complaints of nausea and vomiting.  Patient was found to have diabetic ketoacidosis on presentation and admitted for further management.   Assessment & Plan:   Active Problems:   DKA (diabetic ketoacidoses) (HCC)   Hyperkalemia   Hyponatremia   Acute kidney injury (HCC)   Thrombocytosis (HCC)   Leukocytosis   Intractable nausea and vomiting   DKA, type 1 ([place])  Diabetic ketoacidosis: With severe nausea and vomiting.  Found to have DKA on presentation.  Started on insulin drip. Found to have hyperkalemia, increased anion gap on presentation. Currently gap has closed.  Insulin drip has been discontinued.  Start on Lantus and sliding scale insulin.  Diabetic coordinator following. Patient is on insulin pump at home.  Follows with endocrinology as an outpatient. Continue gentle IV fluids. His current hemoglobin A1c is 12.6.  Unsure about his compliance with insulin.  He will follow-up with his endocrinologist as soon as after discharge.  Alcohol abuse with binge drinking: Will monitor him for withdrawal.  Continue IV fluids.    Leukocytosis: Most likely reactive.  Will check the CBC tomorrow morning.  Hold antibiotics.  Clinically no signs of infection.  Hyponatremia: Resolved  Hyperkalemia: Resolved  Dehydration: Continue IV fluids.  Most likely secondary to nausea and vomiting.  GERD: Complains of reflux and retching.  Could also be associated with binge drinking.  Continue Protonix   DVT prophylaxis: Lovenox Code Status: Full Family Communication: None present at the bedside Disposition Plan: Likely home tomorrow   Consultants: None  Procedures: None  Antimicrobials:  None  Subjective: Patient seen and examined the bedside this morning.  Still remains significantly weak.  Complains of reflux and nausea  Objective: Vitals:   11/19/17 0500 11/19/17 0600 11/19/17 0800 11/19/17 1038  BP: (!) 161/86 134/67  (!) 160/80  Pulse: (!) 104 96  (!) 104  Resp: 17 20  12   Temp:   99.1 F (37.3 C)   TempSrc:   Oral   SpO2: 100% 98%  100%  Weight:      Height:        Intake/Output Summary (Last 24 hours) at 11/19/2017 1316 Last data filed at 11/19/2017 1100 Gross per 24 hour  Intake 4030.39 ml  Output 800 ml  Net 3230.39 ml   Filed Weights   11/18/17 2102  Weight: 101.5 kg (223 lb 12.3 oz)    Examination:  General exam: Weak ,Not in distress,average built HEENT:PERRL,Oral mucosa moist, Ear/Nose normal on gross exam Respiratory system: Bilateral equal air entry, normal vesicular breath sounds, no wheezes or crackles  Cardiovascular system: S1 & S2 heard, RRR. No JVD, murmurs, rubs, gallops or clicks. No pedal edema. Gastrointestinal system: Abdomen is nondistended, soft and nontender. No organomegaly or masses felt. Normal bowel sounds heard. Central nervous system: Alert and oriented. No focal neurological deficits. Extremities: No edema, no clubbing ,no cyanosis, distal peripheral pulses palpable. Skin: No rashes, lesions or ulcers,no icterus ,no pallor MSK: Normal muscle bulk,tone ,power Psychiatry: Judgement and insight appear normal. Mood & affect appropriate.     Data Reviewed: I have personally reviewed following labs and imaging studies  CBC: Recent Labs  Lab 11/18/17 1439 11/18/17 1843  WBC 24.2* 24.6*  NEUTROABS 21.5* 21.6*  HGB  17.9* 16.6  HCT 51.9 49.6  MCV 86.5 88.1  PLT 360 195   Basic Metabolic Panel: Recent Labs  Lab 11/18/17 1553 11/18/17 1843 11/18/17 2126 11/19/17 0114 11/19/17 0628  NA 130* 138 139 138 140  K 6.0* 5.3* 4.8 5.4* 4.3  CL 94* 104 109 112* 110  CO2 8* 8* 11* 13* 19*  GLUCOSE 514* 415* 270* 192*  142*  BUN 32* 32* 27* 24* 22*  CREATININE 1.60* 1.55* 1.56* 1.15 1.05  CALCIUM 9.0 8.9 8.7* 8.5* 9.2  MG 2.5* 2.6*  --   --   --   PHOS 5.5*  --   --   --   --    GFR: Estimated Creatinine Clearance: 122.6 mL/min (by C-G formula based on SCr of 1.05 mg/dL). Liver Function Tests: Recent Labs  Lab 11/18/17 1553  AST 23  ALT 18  ALKPHOS 103  BILITOT 2.4*  PROT 8.5*  ALBUMIN 4.9   No results for input(s): LIPASE, AMYLASE in the last 168 hours. No results for input(s): AMMONIA in the last 168 hours. Coagulation Profile: No results for input(s): INR, PROTIME in the last 168 hours. Cardiac Enzymes: Recent Labs  Lab 11/18/17 1843 11/19/17 0114 11/19/17 0628  TROPONINI <0.03 <0.03 <0.03   BNP (last 3 results) No results for input(s): PROBNP in the last 8760 hours. HbA1C: No results for input(s): HGBA1C in the last 72 hours. CBG: Recent Labs  Lab 11/19/17 0717 11/19/17 0822 11/19/17 0926 11/19/17 1052 11/19/17 1214  GLUCAP 136* 133* 176* 188* 195*   Lipid Profile: No results for input(s): CHOL, HDL, LDLCALC, TRIG, CHOLHDL, LDLDIRECT in the last 72 hours. Thyroid Function Tests: No results for input(s): TSH, T4TOTAL, FREET4, T3FREE, THYROIDAB in the last 72 hours. Anemia Panel: No results for input(s): VITAMINB12, FOLATE, FERRITIN, TIBC, IRON, RETICCTPCT in the last 72 hours. Sepsis Labs: Recent Labs  Lab 11/18/17 1553  LATICACIDVEN 2.8*    No results found for this or any previous visit (from the past 240 hour(s)).       Radiology Studies: No results found.      Scheduled Meds: . insulin aspart  0-15 Units Subcutaneous TID WC  . insulin aspart  0-5 Units Subcutaneous QHS  . insulin glargine  25 Units Subcutaneous Daily  . lisinopril  20 mg Oral Daily  . pantoprazole  40 mg Oral Daily   Continuous Infusions: . sodium chloride Stopped (11/18/17 2256)  . sodium chloride       LOS: 1 day    Time spent: 25  mins.More than 50% of that time was  spent in counseling and/or coordination of care.      Shelly Coss, MD [facility] Pager [phone]  If 7PM-7AM, please contact night-coverage [email or web address] Password [email or web address] 11/19/2017, 1:16 PM

## 2017-11-19 NOTE — Progress Notes (Signed)
Patient to transfer to 5E 1507 report given to receiving nurse, all questions answered at this time.  Pt. VSS with no s/s of distress.

## 2017-11-19 NOTE — Progress Notes (Signed)
Inpatient Diabetes Program Recommendations  AACE/ADA: New Consensus Statement on Inpatient Glycemic Control (2019)  Target Ranges:  Prepandial:   less than 140 mg/dL      Peak postprandial:   less than 180 mg/dL (1-2 hours)      Critically ill patients:  140 - 180 mg/dL  Results for Barry Pittman, Barry Pittman (MRN [id number]) as of 11/19/2017 10:30  Ref. Range 11/19/2017 00:49 11/19/2017 01:53 11/19/2017 02:55 11/19/2017 03:56 11/19/2017 05:07 11/19/2017 06:10 11/19/2017 07:17 11/19/2017 08:22 11/19/2017 09:26  Glucose-Capillary Latest Ref Range: 70 - 99 mg/dL 208 (H) 190 (H) 173 (H) 130 (H) 138 (H) 133 (H) 136 (H) 133 (H) 176 (H)   Results for Barry Pittman, Barry Pittman (MRN [id number]) as of 11/19/2017 10:30  Ref. Range 11/18/2017 15:53  Glucose Latest Ref Range: 70 - 99 mg/dL [facility])   Results for Barry Pittman, Barry Pittman (MRN [id number]) as of 11/19/2017 10:30  Ref. Range 07/06/2017 09:00  Hemoglobin A1C Latest Ref Range: 4.6 - 6.5 % 11.6 (H)   Review of Glycemic Control  Diabetes history: DM1 (makes no insulin; requires basal, correction, and meal coverage insulin) Outpatient Diabetes medications: Medtronic insulin pump with Humalog insulin Current orders for Inpatient glycemic control: Lantus 25 units daily, Novolog 0-15 units TID with meals, Novolog 0-5 units QHS  Inpatient Diabetes Program Recommendations:  Insulin - Basal: Noted patient received Lantus 25 units at 9:15 am today. If glucose is consistently greater than 180 mg/dl with Novolog correction and meal coverage, please consider ordering an additional Lantus 10 units x1 (for total of 35 units). Insulin-Correction: Please consider decreasing Novolog correction to sensitive scale (0-9 units TID). Insulin - Meal Coverage: Please consider ordering Novolog 5 units TID with meals for meal coverage if patient eats at least 50% of meals. HgbA1C: Current A1C in process. Last A1C in chart was 11.6% on 07/06/17.  NOTE: Noted consult for Diabetes Coordinator. Diabetes  Coordinator is not on campus over the weekend but available by pager from 8am to 5pm for questions or concerns. Per chart review, patient is followed by Dr. Cruzita Lederer (Endocrinologist) and last office visit was on 07/06/17. Patient uses a Medtronic 670G insulin pump with Humalog insulin. Per office visit note on 07/06/17 by Dr. Cruzita Lederer, the following should be patients insulin pump settings:  Basal rates:  12 am: 1.6 units/hour 4 am: 2.0  units/hour 6 am: 2.2  units/hour 9 am: 2.2  units/hour 11 am: 2.2  units/hour 10 pm: 2.1 units/hour Total basal in 24H: 49.8 units  Insulin to Carb Ratio 12 am: 1:7  1 unit covers 7 grams of carbs 6 pm: 1:6    1 unit covers 6 grams of carbs  Insulin Sensitivity Factor 1:25   1 unit drops glucose 25 mg/dl   Target Glucose: 100 mg/dl  Will plan to have Inpatient Diabetes Coordinator follow up with patient on Monday (8/5).  Recommend patient call [phone] number on back of insulin pump to trouble shoot pump just to be sure there are no issues with insulin pump.  Thanks, Barnie Alderman, RN, MSN, CDE Diabetes Coordinator Inpatient Diabetes Program [phone] (Team Pager from 8am to 5pm)

## 2017-11-19 NOTE — Progress Notes (Signed)
New Tsfr Note:    Arrival Method: Wheelchair Mental Orientation: Alert & Oriented x 4 Pt is vomiting in bag and complaining of pain from stomach acid. Pain Meds and Nausea meds given by nurse after arrival to unit. Orders have been reviewed and implemented.  Will continue to monitor the patient.

## 2017-11-20 LAB — GLUCOSE, CAPILLARY
GLUCOSE-CAPILLARY: 200 mg/dL — AB (ref 70–99)
GLUCOSE-CAPILLARY: 207 mg/dL — AB (ref 70–99)
GLUCOSE-CAPILLARY: 138 mg/dL — AB (ref 70–99)
GLUCOSE-CAPILLARY: 134 mg/dL — AB (ref 70–99)
GLUCOSE-CAPILLARY: 171 mg/dL — AB (ref 70–99)
Glucose-Capillary: 224 mg/dL — ABNORMAL HIGH (ref 70–99)
Glucose-Capillary: 317 mg/dL — ABNORMAL HIGH (ref 70–99)
Glucose-Capillary: 321 mg/dL — ABNORMAL HIGH (ref 70–99)
Glucose-Capillary: 255 mg/dL — ABNORMAL HIGH (ref 70–99)
Glucose-Capillary: 232 mg/dL — ABNORMAL HIGH (ref 70–99)
Glucose-Capillary: 159 mg/dL — ABNORMAL HIGH (ref 70–99)
Glucose-Capillary: 150 mg/dL — ABNORMAL HIGH (ref 70–99)
Glucose-Capillary: 164 mg/dL — ABNORMAL HIGH (ref 70–99)
Glucose-Capillary: 161 mg/dL — ABNORMAL HIGH (ref 70–99)
Glucose-Capillary: 195 mg/dL — ABNORMAL HIGH (ref 70–99)

## 2017-11-20 LAB — BASIC METABOLIC PANEL
ANION GAP: 17 — AB (ref 5–15)
Anion gap: 20 — ABNORMAL HIGH (ref 5–15)
Anion gap: 17 — ABNORMAL HIGH (ref 5–15)
Anion gap: 10 (ref 5–15)
Anion gap: 13 (ref 5–15)
BUN: 17 mg/dL (ref 6–20)
BUN: 19 mg/dL (ref 6–20)
BUN: 18 mg/dL (ref 6–20)
BUN: 17 mg/dL (ref 6–20)
BUN: 17 mg/dL (ref 6–20)
CHLORIDE: 105 mmol/L (ref 98–111)
CHLORIDE: 109 mmol/L (ref 98–111)
CHLORIDE: 110 mmol/L (ref 98–111)
CO2: 14 mmol/L — ABNORMAL LOW (ref 22–32)
CO2: 9 mmol/L — ABNORMAL LOW (ref 22–32)
CO2: 11 mmol/L — ABNORMAL LOW (ref 22–32)
CO2: 16 mmol/L — ABNORMAL LOW (ref 22–32)
CO2: 15 mmol/L — AB (ref 22–32)
CREATININE: 1.23 mg/dL (ref 0.61–1.24)
CREATININE: 1.18 mg/dL (ref 0.61–1.24)
CREATININE: 0.91 mg/dL (ref 0.61–1.24)
Calcium: 9.2 mg/dL (ref 8.9–10.3)
Calcium: 9.3 mg/dL (ref 8.9–10.3)
Calcium: 9.3 mg/dL (ref 8.9–10.3)
Calcium: 8.8 mg/dL — ABNORMAL LOW (ref 8.9–10.3)
Calcium: 9.2 mg/dL (ref 8.9–10.3)
Chloride: 103 mmol/L (ref 98–111)
Chloride: 110 mmol/L (ref 98–111)
Creatinine, Ser: 1.05 mg/dL (ref 0.61–1.24)
Creatinine, Ser: 1.11 mg/dL (ref 0.61–1.24)
GFR calc Af Amer: >60 mL/min (ref >60)
GFR calc Af Amer: >60 mL/min (ref >60)
GFR calc Af Amer: >60 mL/min (ref >60)
GFR calc Af Amer: >60 mL/min (ref >60)
GFR calc non Af Amer: >60 mL/min (ref >60)
GFR calc non Af Amer: >60 mL/min (ref >60)
GFR calc non Af Amer: >60 mL/min (ref >60)
GLUCOSE: 368 mg/dL — AB (ref 70–99)
GLUCOSE: 191 mg/dL — AB (ref 70–99)
Glucose, Bld: 332 mg/dL — ABNORMAL HIGH (ref 70–99)
Glucose, Bld: 197 mg/dL — ABNORMAL HIGH (ref 70–99)
Glucose, Bld: 144 mg/dL — ABNORMAL HIGH (ref 70–99)
POTASSIUM: 4.9 mmol/L (ref 3.5–5.1)
POTASSIUM: 4.6 mmol/L (ref 3.5–5.1)
POTASSIUM: 3.9 mmol/L (ref 3.5–5.1)
Potassium: 4.3 mmol/L (ref 3.5–5.1)
Potassium: 4.2 mmol/L (ref 3.5–5.1)
SODIUM: 134 mmol/L — AB (ref 135–145)
SODIUM: 137 mmol/L (ref 135–145)
SODIUM: 136 mmol/L (ref 135–145)
SODIUM: 138 mmol/L (ref 135–145)
Sodium: 134 mmol/L — ABNORMAL LOW (ref 135–145)

## 2017-11-20 LAB — CBC WITH DIFFERENTIAL/PLATELET
BASOS ABS: 0 10*3/uL (ref 0.0–0.1)
Basophils Relative: 0 %
EOS PCT: 0 %
Eosinophils Absolute: 0 10*3/uL (ref 0.0–0.7)
HCT: 48.8 % (ref 39.0–52.0)
HEMOGLOBIN: 16.2 g/dL (ref 13.0–17.0)
LYMPHS PCT: 10 %
Lymphs Abs: 1.3 10*3/uL (ref 0.7–4.0)
MCH: 28.8 pg (ref 26.0–34.0)
MCHC: 33.2 g/dL (ref 30.0–36.0)
MCV: 86.8 fL (ref 78.0–100.0)
Monocytes Absolute: 0.8 10*3/uL (ref 0.1–1.0)
Monocytes Relative: 6 %
NEUTROS ABS: 10.8 10*3/uL — AB (ref 1.7–7.7)
NEUTROS PCT: 84 %
PLATELETS: 320 10*3/uL (ref 150–400)
RBC: 5.62 MIL/uL (ref 4.22–5.81)
RDW: 13.3 % (ref 11.5–15.5)
WBC: 12.9 10*3/uL — AB (ref 4.0–10.5)

## 2017-11-20 MED ORDER — DEXTROSE-NACL 5-0.45 % IV SOLN
INTRAVENOUS | Status: DC
  Administered 2017-11-20 – 2017-11-21 (×2): via INTRAVENOUS

## 2017-11-20 MED ORDER — SODIUM CHLORIDE 0.9 % IV SOLN
INTRAVENOUS | Status: DC
  Administered 2017-11-20 – 2017-11-23 (×6): via INTRAVENOUS

## 2017-11-20 MED ORDER — SODIUM CHLORIDE 0.9 % IV SOLN
INTRAVENOUS | Status: DC
  Administered 2017-11-20: 2.6 [IU]/h via INTRAVENOUS
  Administered 2017-11-21: 4.5 [IU]/h via INTRAVENOUS
  Filled 2017-11-20 (×2): qty 1

## 2017-11-20 MED ORDER — LABETALOL HCL 5 MG/ML IV SOLN
10.0000 mg | INTRAVENOUS | Status: DC | PRN
  Administered 2017-11-20 – 2017-11-21 (×5): 10 mg via INTRAVENOUS
  Filled 2017-11-20 (×6): qty 4

## 2017-11-20 MED ORDER — ONDANSETRON HCL 4 MG/2ML IJ SOLN
4.0000 mg | Freq: Four times a day (QID) | INTRAMUSCULAR | Status: DC | PRN
  Administered 2017-11-20 – 2017-11-21 (×3): 4 mg via INTRAVENOUS
  Filled 2017-11-20 (×3): qty 2

## 2017-11-20 MED ORDER — POTASSIUM CHLORIDE 10 MEQ/100ML IV SOLN
10.0000 meq | INTRAVENOUS | Status: DC
  Administered 2017-11-20 (×3): 10 meq via INTRAVENOUS
  Filled 2017-11-20 (×3): qty 100

## 2017-11-20 NOTE — Progress Notes (Signed)
Inpatient Diabetes Program Recommendations  AACE/ADA: New Consensus Statement on Inpatient Glycemic Control (2015)  Target Ranges:  Prepandial:   less than 140 mg/dL      Peak postprandial:   less than 180 mg/dL (1-2 hours)      Critically ill patients:  140 - 180 mg/dL   Lab Results  Component Value Date   GLUCAP 138 (H) 11/20/2017   HGBA1C 12.6 (H) 11/19/2017    Review of Glycemic Control  Pt transferred to ICU for IV insulin drip. Received page regarding locating pt's insulin pump. This Coordinator did not see insulin pump this morning. I told pt that he could not put insulin pump back on until he called the 1-800 number on the back of pump to do quality check to make certain it's working properly.   Still nauseated and dry heaving.  Continue with insulin drip until criteria met for discontinuation. Discussed with RN and MD.  Thank you. Lorenda Peck, RD, LDN, CDE Inpatient Diabetes Coordinator [phone]

## 2017-11-20 NOTE — Progress Notes (Signed)
Inpatient Diabetes Program Recommendations  AACE/ADA: New Consensus Statement on Inpatient Glycemic Control (2015)  Target Ranges:  Prepandial:   less than 140 mg/dL      Peak postprandial:   less than 180 mg/dL (1-2 hours)      Critically ill patients:  140 - 180 mg/dL   Lab Results  Component Value Date   GLUCAP 317 (H) 11/20/2017   HGBA1C 12.6 (H) 11/19/2017    Review of Glycemic Control  Briefly spoke with pt this am - continues with nausea and vomiting. Has not eaten today. Instructed pt to call the 1-800 number on back of insulin pump to make certain pump is working correctly prior to restarting. When asked what happened prior to admission, pt states "it was several things - ETOH involved, ate a lot of food, I don't think it's my pump." Stressed importance of contacting pump company to go through checks. Pt states he would do this when he felt better.   Blood sugars this am - 368, 317  HgbA1C - 12.6%. Will need to address this when pt is feeling better. Pt was instructed by Endo to return for OV in 3 months. Pt did not comply.  Inpatient Diabetes Program Recommendations:  Restart IV insulin drip. CO2 - 14   AG - 17  Will need to transfer to ICU.    Thank you. Lorenda Peck, RD, LDN, CDE Inpatient Diabetes Coordinator [phone]

## 2017-11-20 NOTE — Progress Notes (Signed)
PROGRESS NOTE    Barry Pittman  YSA:[id number] DOB: 02-Nov-1985 DOA: 11/18/2017 PCP: Patient, No Pcp Per   Brief Narrative:  Patient is a 32 year old male with past medical history of diabetes type 1, hypertension, alcohol abuse who presents to the emergency department with complaints of nausea and vomiting.  Patient was found to have diabetic ketoacidosis on presentation and admitted for further management.  Patient's anion gap had closed and he was moved to telemetry but had to be transferred back to ICU because his anion gap reopened.   Assessment & Plan:   Principal Problem:   DKA (diabetic ketoacidoses) ([place]) Active Problems:   Hyponatremia   Acute kidney injury ([place])   Thrombocytosis ([facility])   Leukocytosis   Intractable nausea and vomiting   DKA, type 1 ([place])  Diabetic ketoacidosis: Presented With severe nausea and vomiting.  Found to have DKA on presentation.  Started on insulin drip. Found to have hyperkalemia, increased anion gap on presentation. Gap had closed but reopened today.On  insulin drip has been discontinued. Diabetic coordinator following. Patient is on insulin pump at home.  Follows with endocrinology as an outpatient. Continue gentle IV fluids. His current hemoglobin A1c is 12.6.  Unsure about his compliance with insulin.  He will follow-up with his endocrinologist as soon as after discharge. He might be discharged home with long-acting and short-acting insulin on discharge rather than insulin pump because of his compliance issues.  He might restart the insulin pump after he sees his endocrinologist as an outpatient.  Alcohol abuse with binge drinking: Will monitor him for withdrawal.  Continue IV fluids.    Leukocytosis: Most likely reactive.   Hold antibiotics.  Clinically no signs of infection.  Hyponatremia: Resolved  Hyperkalemia: Resolved  Dehydration: Continue IV fluids.  Most likely secondary to nausea and vomiting.  GERD: Complains of reflux  and retching.  Could also be associated with binge drinking.  Continue Protonix  Abdominal pain/nausea/vomiting: Continue IV fluids, antiemetics and supportive care   DVT prophylaxis: Lovenox Code Status: Full Family Communication: None present at the bedside Disposition Plan: After resolution of DKA  Consultants: None  Procedures: None  Antimicrobials: None  Subjective: Patient seen and examined the bedside this morning.  Redeveloped abdominal pain, nausea and vomiting this morning.  Found to be tachycardic.  Gap has reopened.  Will be moved to ICU again.  Objective: Vitals:   11/20/17 0411 11/20/17 0811 11/20/17 1100 11/20/17 1150  BP: 136/77 (!) 166/94 (!) 195/109   Pulse: (!) 102 (!) 123 (!) 125   Resp: 17 18 14    Temp: 98.6 F (37 C) 98.8 F (37.1 C)  98.5 F (36.9 C)  TempSrc: Oral   Oral  SpO2: 100% 100% 100%   Weight:      Height:       No intake or output data in the 24 hours ending 11/20/17 1158 Filed Weights   11/18/17 2102  Weight: 101.5 kg (223 lb 12.3 oz)    Examination:  General exam: Weak , lethargic HEENT:PERRL,Oral mucosa moist, Ear/Nose normal on gross exam Respiratory system: Bilateral equal air entry, normal vesicular breath sounds, no wheezes or crackles  Cardiovascular system: S1 & S2 heard, RRR. No JVD, murmurs, rubs, gallops or clicks. No pedal edema. Gastrointestinal system: Abdomen is nondistended, soft and nontender. No organomegaly or masses felt. Normal bowel sounds heard. Central nervous system: Alert and oriented. No focal neurological deficits. Extremities: No edema, no clubbing ,no cyanosis, distal peripheral pulses palpable. Skin: No rashes,  lesions or ulcers,no icterus ,no pallor MSK: Normal muscle bulk,tone ,power Psychiatry: Judgement and insight appear normal. Mood & affect appropriate.     Data Reviewed: I have personally reviewed following labs and imaging studies  CBC: Recent Labs  Lab 11/18/17 1439 11/18/17 1843  11/20/17 0531  WBC 24.2* 24.6* 12.9*  NEUTROABS 21.5* 21.6* 10.8*  HGB 17.9* 16.6 16.2  HCT 51.9 49.6 48.8  MCV 86.5 88.1 86.8  PLT 360 345 983   Basic Metabolic Panel: Recent Labs  Lab 11/18/17 1553 11/18/17 1843  11/19/17 0114 11/19/17 0628 11/19/17 1908 11/20/17 0531 11/20/17 0926  NA 130* 138   < > 138 140 135 134* 134*  K 6.0* 5.3*   < > 5.4* 4.3 4.6 4.9 4.3  CL 94* 104   < > 112* 110 104 103 105  CO2 8* 8*   < > 13* 19* 14* 14* 9*  GLUCOSE 514* 415*   < > 192* 142* 263* 368* 332*  BUN 32* 32*   < > 24* 22* 18 17 19   CREATININE 1.60* 1.55*   < > 1.15 1.05 1.11 1.05 1.23  CALCIUM 9.0 8.9   < > 8.5* 9.2 9.1 9.2 9.3  MG 2.5* 2.6*  --   --   --   --   --   --   PHOS 5.5*  --   --   --   --   --   --   --    < > = values in this interval not displayed.   GFR: Estimated Creatinine Clearance: 104.6 mL/min (by C-G formula based on SCr of 1.23 mg/dL). Liver Function Tests: Recent Labs  Lab 11/18/17 1553  AST 23  ALT 18  ALKPHOS 103  BILITOT 2.4*  PROT 8.5*  ALBUMIN 4.9   No results for input(s): LIPASE, AMYLASE in the last 168 hours. No results for input(s): AMMONIA in the last 168 hours. Coagulation Profile: No results for input(s): INR, PROTIME in the last 168 hours. Cardiac Enzymes: Recent Labs  Lab 11/18/17 1843 11/19/17 0114 11/19/17 0628  TROPONINI <0.03 <0.03 <0.03   BNP (last 3 results) No results for input(s): PROBNP in the last 8760 hours. HbA1C: Recent Labs    11/19/17 0820  HGBA1C 12.6*   CBG: Recent Labs  Lab 11/19/17 2039 11/20/17 0751 11/20/17 0934 11/20/17 1036 11/20/17 1140  GLUCAP 224* 317* 321* 255* 232*   Lipid Profile: No results for input(s): CHOL, HDL, LDLCALC, TRIG, CHOLHDL, LDLDIRECT in the last 72 hours. Thyroid Function Tests: No results for input(s): TSH, T4TOTAL, FREET4, T3FREE, THYROIDAB in the last 72 hours. Anemia Panel: No results for input(s): VITAMINB12, FOLATE, FERRITIN, TIBC, IRON, RETICCTPCT in the last  72 hours. Sepsis Labs: Recent Labs  Lab 11/18/17 1553  LATICACIDVEN 2.8*    Recent Results (from the past 240 hour(s))  Culture, blood (routine x 2)     Status: None (Preliminary result)   Collection Time: 11/18/17  6:02 PM  Result Value Ref Range Status   Specimen Description   Final    BLOOD LEFT WRIST Performed at [facility] [address] [place], [place] [address]    Special Requests   Final    BOTTLES DRAWN AEROBIC AND ANAEROBIC Blood Culture adequate volume Performed at [facility] [address], [place], [place] [address]    Culture   Final    NO GROWTH < 24 HOURS Performed at [facility] [place],  [place] [address]    Report Status PENDING  Incomplete  MRSA PCR Screening     Status: None   Collection Time: 11/19/17  2:50 PM  Result Value Ref Range Status   MRSA by PCR NEGATIVE NEGATIVE Final    Comment:        The GeneXpert MRSA Assay (FDA approved for NASAL specimens only), is one component of a comprehensive MRSA colonization surveillance program. It is not intended to diagnose MRSA infection nor to guide or monitor treatment for MRSA infections. Performed at [facility], [place] [address] [place], [place] [address]          Radiology Studies: No results found.      Scheduled Meds: . enoxaparin (LOVENOX) injection  40 mg Subcutaneous Q24H  . lisinopril  20 mg Oral Daily  . pantoprazole  40 mg Oral Daily   Continuous Infusions: . sodium chloride 150 mL/hr at 11/20/17 0944  . dextrose 5 % and 0.45% NaCl    . insulin (NOVOLIN-R) infusion 5.2 Units/hr (11/20/17 1148)  . potassium chloride 10 mEq (11/20/17 1103)     LOS: 2 days    Time spent: 25  mins.More than 50% of that time was spent in counseling and/or coordination of care.      Shelly Coss, MD [facility] Pager [phone]  If 7PM-7AM, please contact  night-coverage [email or web address] Password [email or web address] 11/20/2017, 11:58 AM

## 2017-11-21 LAB — BASIC METABOLIC PANEL
Anion gap: 11 (ref 5–15)
Anion gap: 11 (ref 5–15)
BUN: 14 mg/dL (ref 6–20)
BUN: 13 mg/dL (ref 6–20)
CALCIUM: 8.9 mg/dL (ref 8.9–10.3)
CHLORIDE: 106 mmol/L (ref 98–111)
CO2: 20 mmol/L — AB (ref 22–32)
CO2: 21 mmol/L — ABNORMAL LOW (ref 22–32)
CREATININE: 0.92 mg/dL (ref 0.61–1.24)
Calcium: 8.7 mg/dL — ABNORMAL LOW (ref 8.9–10.3)
Chloride: 107 mmol/L (ref 98–111)
Creatinine, Ser: 0.7 mg/dL (ref 0.61–1.24)
GFR calc non Af Amer: >60 mL/min (ref >60)
Glucose, Bld: 159 mg/dL — ABNORMAL HIGH (ref 70–99)
Glucose, Bld: 162 mg/dL — ABNORMAL HIGH (ref 70–99)
Potassium: 3.5 mmol/L (ref 3.5–5.1)
Potassium: 3 mmol/L — ABNORMAL LOW (ref 3.5–5.1)
SODIUM: 138 mmol/L (ref 135–145)
Sodium: 138 mmol/L (ref 135–145)

## 2017-11-21 LAB — GLUCOSE, CAPILLARY
GLUCOSE-CAPILLARY: 180 mg/dL — AB (ref 70–99)
GLUCOSE-CAPILLARY: 173 mg/dL — AB (ref 70–99)
GLUCOSE-CAPILLARY: 149 mg/dL — AB (ref 70–99)
GLUCOSE-CAPILLARY: 148 mg/dL — AB (ref 70–99)
GLUCOSE-CAPILLARY: 169 mg/dL — AB (ref 70–99)
GLUCOSE-CAPILLARY: 151 mg/dL — AB (ref 70–99)
GLUCOSE-CAPILLARY: 186 mg/dL — AB (ref 70–99)
Glucose-Capillary: 179 mg/dL — ABNORMAL HIGH (ref 70–99)
Glucose-Capillary: 185 mg/dL — ABNORMAL HIGH (ref 70–99)
Glucose-Capillary: 171 mg/dL — ABNORMAL HIGH (ref 70–99)
Glucose-Capillary: 147 mg/dL — ABNORMAL HIGH (ref 70–99)
Glucose-Capillary: 166 mg/dL — ABNORMAL HIGH (ref 70–99)
Glucose-Capillary: 142 mg/dL — ABNORMAL HIGH (ref 70–99)
Glucose-Capillary: 143 mg/dL — ABNORMAL HIGH (ref 70–99)
Glucose-Capillary: 200 mg/dL — ABNORMAL HIGH (ref 70–99)

## 2017-11-21 MED ORDER — POTASSIUM CHLORIDE CRYS ER 20 MEQ PO TBCR
40.0000 meq | EXTENDED_RELEASE_TABLET | Freq: Once | ORAL | Status: AC
  Administered 2017-11-21: 40 meq via ORAL
  Filled 2017-11-21: qty 2

## 2017-11-21 MED ORDER — INSULIN ASPART 100 UNIT/ML ~~LOC~~ SOLN
6.0000 [IU] | Freq: Three times a day (TID) | SUBCUTANEOUS | Status: DC
  Administered 2017-11-21: 6 [IU] via SUBCUTANEOUS

## 2017-11-21 MED ORDER — POTASSIUM CHLORIDE 10 MEQ/100ML IV SOLN
10.0000 meq | INTRAVENOUS | Status: AC
  Administered 2017-11-21 (×6): 10 meq via INTRAVENOUS
  Filled 2017-11-21 (×6): qty 100

## 2017-11-21 MED ORDER — INSULIN ASPART 100 UNIT/ML ~~LOC~~ SOLN
0.0000 [IU] | Freq: Three times a day (TID) | SUBCUTANEOUS | Status: DC
  Administered 2017-11-21: 2 [IU] via SUBCUTANEOUS

## 2017-11-21 MED ORDER — INSULIN GLARGINE 100 UNIT/ML ~~LOC~~ SOLN
30.0000 [IU] | Freq: Every day | SUBCUTANEOUS | Status: DC
  Administered 2017-11-21: 30 [IU] via SUBCUTANEOUS
  Filled 2017-11-21: qty 0.3

## 2017-11-21 MED ORDER — INSULIN ASPART 100 UNIT/ML ~~LOC~~ SOLN
0.0000 [IU] | Freq: Three times a day (TID) | SUBCUTANEOUS | Status: DC
  Administered 2017-11-21: 2 [IU] via SUBCUTANEOUS
  Administered 2017-11-22: 3 [IU] via SUBCUTANEOUS
  Administered 2017-11-22: 7 [IU] via SUBCUTANEOUS

## 2017-11-21 MED ORDER — INSULIN ASPART 100 UNIT/ML ~~LOC~~ SOLN: 0.0000 [IU] | Freq: Three times a day (TID) | SUBCUTANEOUS | Status: DC

## 2017-11-21 MED ORDER — INSULIN GLARGINE 100 UNIT/ML ~~LOC~~ SOLN
45.0000 [IU] | Freq: Every day | SUBCUTANEOUS | Status: DC
  Administered 2017-11-22: 45 [IU] via SUBCUTANEOUS
  Filled 2017-11-21: qty 0.45

## 2017-11-21 MED ORDER — INSULIN ASPART 100 UNIT/ML ~~LOC~~ SOLN: 0.0000 [IU] | Freq: Every day | SUBCUTANEOUS | Status: DC

## 2017-11-21 MED ORDER — ALUM & MAG HYDROXIDE-SIMETH 200-200-20 MG/5ML PO SUSP
30.0000 mL | ORAL | Status: DC | PRN
  Administered 2017-11-21: 30 mL via ORAL
  Filled 2017-11-21: qty 30

## 2017-11-21 MED ORDER — INSULIN GLARGINE 100 UNIT/ML ~~LOC~~ SOLN
15.0000 [IU] | Freq: Once | SUBCUTANEOUS | Status: AC
  Administered 2017-11-21: 15 [IU] via SUBCUTANEOUS
  Filled 2017-11-21: qty 0.15

## 2017-11-21 MED ORDER — INSULIN ASPART 100 UNIT/ML ~~LOC~~ SOLN
3.0000 [IU] | Freq: Three times a day (TID) | SUBCUTANEOUS | Status: DC
  Administered 2017-11-21: 3 [IU] via SUBCUTANEOUS

## 2017-11-21 NOTE — Progress Notes (Signed)
PROGRESS NOTE    Barry Pittman  ZOX:[id number] DOB: 1985-06-19 DOA: 11/18/2017 PCP: Patient, No Pcp Per   Brief Narrative:  Patient is a 32 year old male with past medical history of diabetes type 1, hypertension, alcohol abuse who presents to the emergency department with complaints of nausea and vomiting.  Patient was found to have diabetic ketoacidosis on presentation and admitted for further management.  Patient's anion gap had closed and he was moved to telemetry but had to be transferred back to ICU because his anion gap reopened.  This morning the anion gap has closed again .Insulin drip discontinued and he has been started on Lantus and sliding scale.   Assessment & Plan:   Principal Problem:   DKA (diabetic ketoacidoses) ([place]) Active Problems:   Hyponatremia   Acute kidney injury ([place])   Thrombocytosis ([facility])   Leukocytosis   Intractable nausea and vomiting   DKA, type 1 ([place])  Diabetic ketoacidosis: Presented With severe nausea and vomiting.  Found to have DKA on presentation.  Started on insulin drip. Found to have hyperkalemia, increased anion gap on presentation. Gap had closed but reopened so he had to be moved back to ICU. Gap again closed today.  Started on Lantus and sliding scale.  Started on clear liquid diet.  Nausea, vomiting and abdominal pain have improved. Diabetic coordinator following. Patient is on insulin pump at home.  Follows with endocrinology as an outpatient. Continue gentle IV fluids. His current hemoglobin A1c is 12.6.  He is noncompliant with his insulin and diet. Patient was also drinking alcohol.  He will follow-up with his endocrinologist as soon as after discharge. He might be discharged home with long-acting and short-acting insulin( may be lantus/Novolog vs 70/30) on discharge rather than insulin pump because of his compliance issues.  He might restart the insulin pump after he sees his endocrinologist as an outpatient.  Alcohol abuse  with binge drinking: Will monitor him for withdrawal.  Continue IV fluids.    Leukocytosis: Most likely reactive.   Hold antibiotics.  Clinically no signs of infection.  Hyponatremia: Resolved  Hyperkalemia: Resolved  Dehydration: Continue IV fluids.  Most likely secondary to nausea and vomiting.  GERD: Complains of reflux and retching.  Could also be associated with binge drinking.  Continue Protonix  Abdominal pain/nausea/vomiting: Much improved.Continue IV fluids, antiemetics and supportive care.  Hypertension: On lisinopril at home which we will continue.  Continue PRN meds   DVT prophylaxis: Lovenox Code Status: Full Family Communication: None present at the bedside Disposition Plan: Likely home tomorrow.  Needs follow-up from diabetic coordinator for recommendation on insulin on discharge.  Would like to monitor him 1 more night  tonight.  Consultants: None  Procedures: None  Antimicrobials: None  Subjective: Patient seen and examined the bedside this morning.  Abdominal pain, nausea and vomiting have improved.  Gap is closed.  Feels better today   Objective: Vitals:   11/21/17 0600 11/21/17 0800 11/21/17 1000 11/21/17 1200  BP: (!) 148/88 138/72 (!) 185/96 (!) 147/82  Pulse: 85 89 91 96  Resp: 20 20 14 13   Temp:  98.3 F (36.8 C)    TempSrc:  Oral    SpO2: 96% 96% 98% 99%  Weight:      Height:        Intake/Output Summary (Last 24 hours) at 11/21/2017 1329 Last data filed at 11/21/2017 1224 Gross per 24 hour  Intake 2255.06 ml  Output 2226 ml  Net 29.06 ml   [employer]  11/18/17 2102 11/21/17 0332  Weight: 101.5 kg (223 lb 12.3 oz) 97.5 kg (214 lb 15.2 oz)    Examination:  General exam: Not in distress,average built HEENT:PERRL,Oral mucosa moist, Ear/Nose normal on gross exam Respiratory system: Bilateral equal air entry, normal vesicular breath sounds, no wheezes or crackles  Cardiovascular system: S1 & S2 heard, RRR. No JVD, murmurs, rubs,  gallops or clicks. Gastrointestinal system: Abdomen is nondistended, soft and nontender. No organomegaly or masses felt. Normal bowel sounds heard. Central nervous system: Alert and oriented. No focal neurological deficits. Extremities: No edema, no clubbing ,no cyanosis, distal peripheral pulses palpable. Skin: No rashes, lesions or ulcers,no icterus ,no pallor MSK: Normal muscle bulk,tone ,power Psychiatry: Judgement and insight appear normal. Mood & affect appropriate.     Data Reviewed: I have personally reviewed following labs and imaging studies  CBC: Recent Labs  Lab 11/18/17 1439 11/18/17 1843 11/20/17 0531  WBC 24.2* 24.6* 12.9*  NEUTROABS 21.5* 21.6* 10.8*  HGB 17.9* 16.6 16.2  HCT 51.9 49.6 48.8  MCV 86.5 88.1 86.8  PLT 360 345 166   Basic Metabolic Panel: Recent Labs  Lab 11/18/17 1553 11/18/17 1843  11/20/17 0926 11/20/17 1327 11/20/17 1707 11/20/17 2102 11/21/17 0747  NA 130* 138   < > 134* 137 136 138 138  K 6.0* 5.3*   < > 4.3 4.6 4.2 3.9 3.5  CL 94* 104   < > 105 109 110 110 107  CO2 8* 8*   < > 9* 11* 16* 15* 20*  GLUCOSE 514* 415*   < > 332* 197* 144* 191* 159*  BUN 32* 32*   < > 19 18 17 17 14   CREATININE 1.60* 1.55*   < > 1.23 1.18 0.91 1.11 0.92  CALCIUM 9.0 8.9   < > 9.3 9.3 8.8* 9.2 8.9  MG 2.5* 2.6*  --   --   --   --   --   --   PHOS 5.5*  --   --   --   --   --   --   --    < > = values in this interval not displayed.   GFR: Estimated Creatinine Clearance: 137.3 mL/min (by C-G formula based on SCr of 0.92 mg/dL). Liver Function Tests: Recent Labs  Lab 11/18/17 1553  AST 23  ALT 18  ALKPHOS 103  BILITOT 2.4*  PROT 8.5*  ALBUMIN 4.9   No results for input(s): LIPASE, AMYLASE in the last 168 hours. No results for input(s): AMMONIA in the last 168 hours. Coagulation Profile: No results for input(s): INR, PROTIME in the last 168 hours. Cardiac Enzymes: Recent Labs  Lab 11/18/17 1843 11/19/17 0114 11/19/17 0628  TROPONINI  <0.03 <0.03 <0.03   BNP (last 3 results) No results for input(s): PROBNP in the last 8760 hours. HbA1C: Recent Labs    11/19/17 0820  HGBA1C 12.6*   CBG: Recent Labs  Lab 11/21/17 0731 11/21/17 0847 11/21/17 0950 11/21/17 1056 11/21/17 1200  GLUCAP 166* 142* 169* 151* 143*   Lipid Profile: No results for input(s): CHOL, HDL, LDLCALC, TRIG, CHOLHDL, LDLDIRECT in the last 72 hours. Thyroid Function Tests: No results for input(s): TSH, T4TOTAL, FREET4, T3FREE, THYROIDAB in the last 72 hours. Anemia Panel: No results for input(s): VITAMINB12, FOLATE, FERRITIN, TIBC, IRON, RETICCTPCT in the last 72 hours. Sepsis Labs: Recent Labs  Lab 11/18/17 1553  LATICACIDVEN 2.8*    Recent Results (from the past 240 hour(s))  Culture, blood (routine x 2)  Status: None (Preliminary result)   Collection Time: 11/18/17  6:02 PM  Result Value Ref Range Status   Specimen Description   Final    BLOOD LEFT WRIST Performed at [facility] [address], [place], [place] [address]    Special Requests   Final    BOTTLES DRAWN AEROBIC AND ANAEROBIC Blood Culture adequate volume Performed at [facility] [address], [place], [place] [address]    Culture   Final    NO GROWTH 2 DAYS Performed at [facility] [address], [place], [place] [address]    Report Status PENDING  Incomplete  Culture, blood (routine x 2)     Status: None (Preliminary result)   Collection Time: 11/19/17  6:28 AM  Result Value Ref Range Status   Specimen Description   Final    BLOOD RIGHT ARM Performed at [facility] [address] [place], [place] [address]    Special Requests   Final    BOTTLES DRAWN AEROBIC ONLY Blood Culture results may not be optimal due to an inadequate volume of blood received in culture bottles Performed at [facility] [address] [place], [place] [address]    Culture   Final    NO  GROWTH < 24 HOURS Performed at [facility] [address], [place], [place] [address]    Report Status PENDING  Incomplete  MRSA PCR Screening     Status: None   Collection Time: 11/19/17  2:50 PM  Result Value Ref Range Status   MRSA by PCR NEGATIVE NEGATIVE Final    Comment:        The GeneXpert MRSA Assay (FDA approved for NASAL specimens only), is one component of a comprehensive MRSA colonization surveillance program. It is not intended to diagnose MRSA infection nor to guide or monitor treatment for MRSA infections. Performed at [facility], [place] [address], [place],  [address]          Radiology Studies: No results found.      Scheduled Meds: . enoxaparin (LOVENOX) injection  40 mg Subcutaneous Q24H  . insulin aspart  0-9 Units Subcutaneous TID WC  . insulin aspart  6 Units Subcutaneous TID WC  . insulin glargine  15 Units Subcutaneous Once  . [START ON 11/22/2017] insulin glargine  45 Units Subcutaneous Daily  . lisinopril  20 mg Oral Daily  . pantoprazole  40 mg Oral Daily   Continuous Infusions: . sodium chloride 100 mL/hr at 11/21/17 1224     LOS: 3 days    Time spent: 25  mins.More than 50% of that time was spent in counseling and/or coordination of care.      Shelly Coss, MD [facility] Pager [phone]  If 7PM-7AM, please contact night-coverage [email or web address] Password [email or web address] 11/21/2017, 1:29 PM

## 2017-11-21 NOTE — Progress Notes (Addendum)
Inpatient Diabetes Program Recommendations  AACE/ADA: New Consensus Statement on Inpatient Glycemic Control (2015)  Target Ranges:  Prepandial:   less than 140 mg/dL      Peak postprandial:   less than 180 mg/dL (1-2 hours)      Critically ill patients:  140 - 180 mg/dL   Lab Results  Component Value Date   GLUCAP 151 (H) 11/21/2017   HGBA1C 12.6 (H) 11/19/2017    Review of Glycemic Control  Criteria met for transitioning off insulin drip to basal/bolus.  Total basal units in 24H on pump: 49.8 units  Inpatient Diabetes Program Recommendations:  Increase Lantus to 45 units Q24H Increase Novolog to 6 units tidwc and titrate if blood sugars are > 180 mg/dL. Decrease Novolog to 0-9 units tidwc and hs, as pt is Type 1 and sensitive to insulin.  Will attempt to talk with pt about his HgbA1C of 12.6%.  Thank you. Lorenda Peck, RD, LDN, CDE Inpatient Diabetes Coordinator [phone]   Attempted to speak with pt about his HgbA1C of 12.6%. He states he's nauseated, will not lift head off pillow. States he's "good" with his diabetes sometimes, and he's "bad" sometimes. I explained that it is very unusual for pump pt to have HgbA1C > 12%. He did not reply. Fiance said his pump has been lost here at the hospital. She wasn't with him at admission and doesn't know where it is. He states he thought it was in his pants pocket. Fiance states it is a $10,000.00 pump and the hospital would need to replace it. Will make ICU Dept Director aware of conversation.

## 2017-11-22 DIAGNOSIS — D473 Essential (hemorrhagic) thrombocythemia: Secondary | ICD-10-CM

## 2017-11-22 DIAGNOSIS — E860 Dehydration: Secondary | ICD-10-CM

## 2017-11-22 LAB — BASIC METABOLIC PANEL
ANION GAP: 16 — AB (ref 5–15)
ANION GAP: 11 (ref 5–15)
Anion gap: 14 (ref 5–15)
Anion gap: 12 (ref 5–15)
Anion gap: 11 (ref 5–15)
BUN: 12 mg/dL (ref 6–20)
BUN: 13 mg/dL (ref 6–20)
BUN: 12 mg/dL (ref 6–20)
BUN: 12 mg/dL (ref 6–20)
BUN: 11 mg/dL (ref 6–20)
CALCIUM: 8.5 mg/dL — AB (ref 8.9–10.3)
CHLORIDE: 104 mmol/L (ref 98–111)
CHLORIDE: 103 mmol/L (ref 98–111)
CHLORIDE: 104 mmol/L (ref 98–111)
CO2: 18 mmol/L — ABNORMAL LOW (ref 22–32)
CO2: 15 mmol/L — AB (ref 22–32)
CO2: 20 mmol/L — ABNORMAL LOW (ref 22–32)
CO2: 21 mmol/L — AB (ref 22–32)
CO2: 21 mmol/L — AB (ref 22–32)
CREATININE: 0.88 mg/dL (ref 0.61–1.24)
CREATININE: 0.76 mg/dL (ref 0.61–1.24)
Calcium: 8.6 mg/dL — ABNORMAL LOW (ref 8.9–10.3)
Calcium: 8.5 mg/dL — ABNORMAL LOW (ref 8.9–10.3)
Calcium: 8.4 mg/dL — ABNORMAL LOW (ref 8.9–10.3)
Calcium: 8.4 mg/dL — ABNORMAL LOW (ref 8.9–10.3)
Chloride: 105 mmol/L (ref 98–111)
Chloride: 104 mmol/L (ref 98–111)
Creatinine, Ser: 0.82 mg/dL (ref 0.61–1.24)
Creatinine, Ser: 0.78 mg/dL (ref 0.61–1.24)
Creatinine, Ser: 0.65 mg/dL (ref 0.61–1.24)
GFR calc Af Amer: >60 mL/min (ref >60)
GFR calc Af Amer: >60 mL/min (ref >60)
GFR calc Af Amer: >60 mL/min (ref >60)
GFR calc non Af Amer: >60 mL/min (ref >60)
GFR calc non Af Amer: >60 mL/min (ref >60)
GFR calc non Af Amer: >60 mL/min (ref >60)
GFR calc non Af Amer: >60 mL/min (ref >60)
GLUCOSE: 171 mg/dL — AB (ref 70–99)
GLUCOSE: 131 mg/dL — AB (ref 70–99)
Glucose, Bld: 272 mg/dL — ABNORMAL HIGH (ref 70–99)
Glucose, Bld: 328 mg/dL — ABNORMAL HIGH (ref 70–99)
Glucose, Bld: 217 mg/dL — ABNORMAL HIGH (ref 70–99)
POTASSIUM: 3.7 mmol/L (ref 3.5–5.1)
POTASSIUM: 3.6 mmol/L (ref 3.5–5.1)
POTASSIUM: 3.2 mmol/L — AB (ref 3.5–5.1)
Potassium: 4 mmol/L (ref 3.5–5.1)
Potassium: 3.7 mmol/L (ref 3.5–5.1)
SODIUM: 134 mmol/L — AB (ref 135–145)
SODIUM: 136 mmol/L (ref 135–145)
Sodium: 136 mmol/L (ref 135–145)
Sodium: 137 mmol/L (ref 135–145)
Sodium: 136 mmol/L (ref 135–145)

## 2017-11-22 LAB — GLUCOSE, CAPILLARY
GLUCOSE-CAPILLARY: 241 mg/dL — AB (ref 70–99)
GLUCOSE-CAPILLARY: 174 mg/dL — AB (ref 70–99)
GLUCOSE-CAPILLARY: 137 mg/dL — AB (ref 70–99)
GLUCOSE-CAPILLARY: 116 mg/dL — AB (ref 70–99)
Glucose-Capillary: 206 mg/dL — ABNORMAL HIGH (ref 70–99)
Glucose-Capillary: 224 mg/dL — ABNORMAL HIGH (ref 70–99)
Glucose-Capillary: 316 mg/dL — ABNORMAL HIGH (ref 70–99)

## 2017-11-22 MED ORDER — DEXTROSE 50 % IV SOLN: 25.0000 mL | INTRAVENOUS | Status: DC | PRN

## 2017-11-22 MED ORDER — LORAZEPAM 2 MG/ML IJ SOLN: 1.0000 mg | Freq: Four times a day (QID) | INTRAMUSCULAR | Status: DC | PRN

## 2017-11-22 MED ORDER — LORAZEPAM 1 MG PO TABS: 1.0000 mg | ORAL_TABLET | Freq: Four times a day (QID) | ORAL | Status: DC | PRN

## 2017-11-22 MED ORDER — INSULIN ASPART 100 UNIT/ML ~~LOC~~ SOLN
8.0000 [IU] | Freq: Three times a day (TID) | SUBCUTANEOUS | Status: DC
  Administered 2017-11-22 – 2017-11-23 (×3): 8 [IU] via SUBCUTANEOUS

## 2017-11-22 MED ORDER — VITAMIN B-1 100 MG PO TABS
100.0000 mg | ORAL_TABLET | Freq: Every day | ORAL | Status: DC
  Administered 2017-11-22 – 2017-11-23 (×2): 100 mg via ORAL
  Filled 2017-11-22 (×2): qty 1

## 2017-11-22 MED ORDER — INSULIN REGULAR BOLUS VIA INFUSION
0.0000 [IU] | Freq: Three times a day (TID) | INTRAVENOUS | Status: DC
  Filled 2017-11-22: qty 10

## 2017-11-22 MED ORDER — DEXTROSE-NACL 5-0.45 % IV SOLN: INTRAVENOUS | Status: DC

## 2017-11-22 MED ORDER — THIAMINE HCL 100 MG/ML IJ SOLN: 100.0000 mg | Freq: Every day | INTRAMUSCULAR | Status: DC

## 2017-11-22 MED ORDER — SODIUM CHLORIDE 0.9 % IV SOLN: INTRAVENOUS | Status: DC

## 2017-11-22 MED ORDER — FOLIC ACID 1 MG PO TABS
1.0000 mg | ORAL_TABLET | Freq: Every day | ORAL | Status: DC
  Administered 2017-11-22 – 2017-11-23 (×2): 1 mg via ORAL
  Filled 2017-11-22 (×2): qty 1

## 2017-11-22 MED ORDER — SODIUM CHLORIDE 0.9 % IV SOLN
INTRAVENOUS | Status: DC
  Filled 2017-11-22: qty 1

## 2017-11-22 MED ORDER — INSULIN GLARGINE 100 UNIT/ML ~~LOC~~ SOLN
50.0000 [IU] | Freq: Every day | SUBCUTANEOUS | Status: DC
  Administered 2017-11-23: 50 [IU] via SUBCUTANEOUS
  Filled 2017-11-22: qty 0.5

## 2017-11-22 MED ORDER — ADULT MULTIVITAMIN W/MINERALS CH
1.0000 | ORAL_TABLET | Freq: Every day | ORAL | Status: DC
  Administered 2017-11-22 – 2017-11-23 (×2): 1 via ORAL
  Filled 2017-11-22 (×2): qty 1

## 2017-11-22 NOTE — Progress Notes (Signed)
Inpatient Diabetes Program Recommendations  AACE/ADA: New Consensus Statement on Inpatient Glycemic Control (2015)  Target Ranges:  Prepandial:   less than 140 mg/dL      Peak postprandial:   less than 180 mg/dL (1-2 hours)      Critically ill patients:  140 - 180 mg/dL   Lab Results  Component Value Date   GLUCAP 316 (H) 11/22/2017   HGBA1C 12.6 (H) 11/19/2017    Review of Glycemic Control  Spoke with pt about HgbA1C of 12.6%. Discussed with him that HgbA1C should be a lot lower with a pump user. Pt states he hasn't been bolusing as he should. States he wants to forget he has diabetes. Long discussion about living a normal life with diabetes, but blood sugar control is imperative. I asked him to think about why he's not making glucose control important. States he just feels bad, and wants to go home. Very little po intake. Labs look questionable this am.  CO2 - 15 AG - 16  Inpatient Diabetes Program Recommendations:   BMET Q4H Increase Lantus to 50 units QD Increase Novolog to 8 units tidwc when po intake > 50%.  Pt is to f/u with [employer] pump rep and endo if wanting to restart pump. Will likely d/c on basal-bolus - Lantus and Humalog.  Needs much support for management of his diabetes.  Thank you. Lorenda Peck, RD, LDN, CDE Inpatient Diabetes Coordinator [phone]

## 2017-11-22 NOTE — Progress Notes (Signed)
PROGRESS NOTE  SHALAMAR CRAYS QQP:[id number] DOB: Sep 20, 1985 DOA: 11/18/2017 PCP: Patient, No Pcp Per  HPI/Recap of past 24 hours: Patient is a 32 year old male with past medical history of diabetes type 1, hypertension, alcohol abuse who presents to the emergency department with complaints of nausea and vomiting.  Patient was found to have diabetic ketoacidosis on presentation and admitted for further management.  Patient's anion gap had closed and he was moved to telemetry but had to be transferred back to ICU because his anion gap reopened.    Yesterday morning the anion gap closed again. Insulin drip discontinued and started on Lantus and sliding scale.  11/22/2017: Patient seen and examined at his bedside.  He has no new complaints however his anion gap reopened at 16 with chemistry bicarb of 15.  Denies abdominal pain or nausea.    Assessment/Plan: Principal Problem:   DKA (diabetic ketoacidoses) (HCC) Active Problems:   Hyponatremia   Acute kidney injury (HCC)   Thrombocytosis (HCC)   Leukocytosis   Intractable nausea and vomiting   DKA, type 1 (HCC)  Type I DKA An hour gap reopened at 69 Chemistry bicarb of 15 Restart insulin drip BMPs every 4 hours DKA phase 1 protocol  Alcohol abuse with concern for withdrawal CIWA protocol in place  Pseudohyponatremia Sodium 134 and blood glucose of 328 BMPs every 4 hours per DKA protocol  Medical noncompliance Patient will need reinforcement Diabetes coordinator following  Code Status: Full code  Family Communication: None at bedside  Disposition Plan: Home in 1 to 2 days when clinically stable   Consultants:  Diabetes coordinator  Procedures:  None  Antimicrobials:  None  DVT prophylaxis: Subcu Lovenox daily   Objective: Vitals:   11/21/17 1907 11/21/17 2007 11/22/17 0409 11/22/17 1015  BP: (!) 151/93 (!) 147/89 (!) 154/94 (!) 149/95  Pulse: 87 92 90 99  Resp:  12 15   Temp: 98.7 F (37.1 C) 99.1  F (37.3 C) 98.7 F (37.1 C)   TempSrc: Oral Oral Oral   SpO2: 100% 100% 99%   Weight:      Height:        Intake/Output Summary (Last 24 hours) at 11/22/2017 1117 Last data filed at 11/22/2017 1015 Gross per 24 hour  Intake 2232.96 ml  Output 1625 ml  Net 607.96 ml   Filed Weights   11/18/17 2102 11/21/17 0332  Weight: 101.5 kg (223 lb 12.3 oz) 97.5 kg (214 lb 15.2 oz)    Exam:  . General: 32 y.o. year-old male well developed well nourished in no acute distress.  Alert and oriented x3. . Cardiovascular: Regular rate and rhythm with no rubs or gallops.  No thyromegaly or JVD noted.   Marland Kitchen Respiratory: Clear to auscultation with no wheezes or rales. Good inspiratory effort. . Abdomen: Soft nontender nondistended with normal bowel sounds x4 quadrants. . Musculoskeletal: No lower extremity edema. 2/4 pulses in all 4 extremities. . Skin: No ulcerative lesions noted or rashes, . Psychiatry: Mood is appropriate for condition and setting   Data Reviewed: CBC: Recent Labs  Lab 11/18/17 1439 11/18/17 1843 11/20/17 0531  WBC 24.2* 24.6* 12.9*  NEUTROABS 21.5* 21.6* 10.8*  HGB 17.9* 16.6 16.2  HCT 51.9 49.6 48.8  MCV 86.5 88.1 86.8  PLT 360 345 712   Basic Metabolic Panel: Recent Labs  Lab 11/18/17 1553 11/18/17 1843  11/20/17 2102 11/21/17 0747 11/21/17 1453 11/22/17 0405 11/22/17 0902  NA 130* 138   < > 138 138 138  136 134*  K 6.0* 5.3*   < > 3.9 3.5 3.0* 3.7 4.0  CL 94* 104   < > 110 107 106 104 103  CO2 8* 8*   < > 15* 20* 21* 18* 15*  GLUCOSE 514* 415*   < > 191* 159* 162* 272* 328*  BUN 32* 32*   < > 17 14 13 12 13   CREATININE 1.60* 1.55*   < > 1.11 0.92 0.70 0.88 0.82  CALCIUM 9.0 8.9   < > 9.2 8.9 8.7* 8.6* 8.5*  MG 2.5* 2.6*  --   --   --   --   --   --   PHOS 5.5*  --   --   --   --   --   --   --    < > = values in this interval not displayed.   GFR: Estimated Creatinine Clearance: 154 mL/min (by C-G formula based on SCr of 0.82 mg/dL). Liver Function  Tests: Recent Labs  Lab 11/18/17 1553  AST 23  ALT 18  ALKPHOS 103  BILITOT 2.4*  PROT 8.5*  ALBUMIN 4.9   No results for input(s): LIPASE, AMYLASE in the last 168 hours. No results for input(s): AMMONIA in the last 168 hours. Coagulation Profile: No results for input(s): INR, PROTIME in the last 168 hours. Cardiac Enzymes: Recent Labs  Lab 11/18/17 1843 11/19/17 0114 11/19/17 0628  TROPONINI <0.03 <0.03 <0.03   BNP (last 3 results) No results for input(s): PROBNP in the last 8760 hours. HbA1C: No results for input(s): HGBA1C in the last 72 hours. CBG: Recent Labs  Lab 11/21/17 2009 11/22/17 0024 11/22/17 0251 11/22/17 0716 11/22/17 1102  GLUCAP 186* 206* 224* 316* 241*   Lipid Profile: No results for input(s): CHOL, HDL, LDLCALC, TRIG, CHOLHDL, LDLDIRECT in the last 72 hours. Thyroid Function Tests: No results for input(s): TSH, T4TOTAL, FREET4, T3FREE, THYROIDAB in the last 72 hours. Anemia Panel: No results for input(s): VITAMINB12, FOLATE, FERRITIN, TIBC, IRON, RETICCTPCT in the last 72 hours. Urine analysis:    Component Value Date/Time   COLORURINE YELLOW 11/18/2017 1432   APPEARANCEUR CLEAR 11/18/2017 1432   LABSPEC 1.024 11/18/2017 1432   PHURINE 5.0 11/18/2017 1432   GLUCOSEU >=500 (A) 11/18/2017 1432   HGBUR SMALL (A) 11/18/2017 1432   BILIRUBINUR NEGATIVE 11/18/2017 1432   KETONESUR 80 (A) 11/18/2017 1432   PROTEINUR 30 (A) 11/18/2017 1432   NITRITE NEGATIVE 11/18/2017 1432   LEUKOCYTESUR NEGATIVE 11/18/2017 1432   Sepsis Labs: @LABRCNTIP (procalcitonin:4,lacticidven:4)  ) Recent Results (from the past 240 hour(s))  Culture, blood (routine x 2)     Status: None (Preliminary result)   Collection Time: 11/18/17  6:02 PM  Result Value Ref Range Status   Specimen Description   Final    BLOOD LEFT WRIST Performed at [facility], [place] [address] [place], [place] [address]    Special Requests   Final    BOTTLES DRAWN  AEROBIC AND ANAEROBIC Blood Culture adequate volume Performed at [facility] [address], [place], [place] [address]    Culture   Final    NO GROWTH 3 DAYS Performed at [facility], [place] [address], [place], [place] [address]    Report Status PENDING  Incomplete  Culture, blood (routine x 2)     Status: None (Preliminary result)   Collection Time: 11/19/17  6:28 AM  Result Value Ref Range Status   Specimen Description   Final  BLOOD RIGHT ARM Performed at [facility], [place] [address] [place], [place] [address]    Special Requests   Final    BOTTLES DRAWN AEROBIC ONLY Blood Culture results may not be optimal due to an inadequate volume of blood received in culture bottles Performed at [facility] [address] [place], [place] [address]    Culture   Final    NO GROWTH 2 DAYS Performed at [facility] [address], [place], [place] [address]    Report Status PENDING  Incomplete  MRSA PCR Screening     Status: None   Collection Time: 11/19/17  2:50 PM  Result Value Ref Range Status   MRSA by PCR NEGATIVE NEGATIVE Final    Comment:        The GeneXpert MRSA Assay (FDA approved for NASAL specimens only), is one component of a comprehensive MRSA colonization surveillance program. It is not intended to diagnose MRSA infection nor to guide or monitor treatment for MRSA infections. Performed at [facility], [place] [address], [place], [place] [address]       Studies: No results found.  Scheduled Meds: . enoxaparin (LOVENOX) injection  40 mg Subcutaneous Q24H  . insulin aspart  0-9 Units Subcutaneous TID WC  . insulin aspart  6 Units Subcutaneous TID WC  . insulin glargine  45 Units Subcutaneous Daily  . lisinopril  20 mg Oral Daily  . pantoprazole  40 mg Oral Daily    Continuous Infusions: . sodium chloride 100 mL/hr at 11/22/17 1016     LOS: 4 days     Kayleen Memos, MD [facility] Pager [phone]  If 7PM-7AM, please contact night-coverage [email or web address] Password [email or web address] 11/22/2017, 11:17 AM

## 2017-11-23 LAB — BASIC METABOLIC PANEL
ANION GAP: 13 (ref 5–15)
Anion gap: 12 (ref 5–15)
BUN: 11 mg/dL (ref 6–20)
BUN: 11 mg/dL (ref 6–20)
CHLORIDE: 103 mmol/L (ref 98–111)
CO2: 21 mmol/L — ABNORMAL LOW (ref 22–32)
CO2: 19 mmol/L — ABNORMAL LOW (ref 22–32)
CREATININE: 0.7 mg/dL (ref 0.61–1.24)
Calcium: 8.4 mg/dL — ABNORMAL LOW (ref 8.9–10.3)
Calcium: 8.2 mg/dL — ABNORMAL LOW (ref 8.9–10.3)
Chloride: 101 mmol/L (ref 98–111)
Creatinine, Ser: 0.71 mg/dL (ref 0.61–1.24)
GFR calc Af Amer: >60 mL/min (ref >60)
GFR calc Af Amer: >60 mL/min (ref >60)
GFR calc non Af Amer: >60 mL/min (ref >60)
GLUCOSE: 142 mg/dL — AB (ref 70–99)
GLUCOSE: 190 mg/dL — AB (ref 70–99)
Potassium: 3.5 mmol/L (ref 3.5–5.1)
Potassium: 3.3 mmol/L — ABNORMAL LOW (ref 3.5–5.1)
SODIUM: 136 mmol/L (ref 135–145)
SODIUM: 133 mmol/L — AB (ref 135–145)

## 2017-11-23 LAB — CULTURE, BLOOD (ROUTINE X 2)
CULTURE: NO GROWTH
Special Requests: ADEQUATE

## 2017-11-23 LAB — GLUCOSE, CAPILLARY
Glucose-Capillary: 178 mg/dL — ABNORMAL HIGH (ref 70–99)
Glucose-Capillary: 196 mg/dL — ABNORMAL HIGH (ref 70–99)

## 2017-11-23 MED ORDER — BLOOD GLUCOSE MONITOR KIT: PACK | 0 refills | Status: DC

## 2017-11-23 MED ORDER — INSULIN GLARGINE 100 UNIT/ML ~~LOC~~ SOLN: 50.0000 [IU] | Freq: Every day | SUBCUTANEOUS | 0 refills | Status: DC

## 2017-11-23 MED ORDER — PROMETHAZINE HCL 25 MG/ML IJ SOLN: 6.2500 mg | Freq: Four times a day (QID) | INTRAMUSCULAR | Status: DC | PRN

## 2017-11-23 MED ORDER — INSULIN STARTER KIT- SYRINGES (ENGLISH)
1.0000 | Freq: Once | Status: DC
  Filled 2017-11-23: qty 1

## 2017-11-23 MED ORDER — INSULIN ASPART 100 UNIT/ML ~~LOC~~ SOLN: 0.0000 [IU] | Freq: Three times a day (TID) | SUBCUTANEOUS | 0 refills | Status: DC

## 2017-11-23 MED ORDER — THIAMINE HCL 100 MG PO TABS: 100.0000 mg | ORAL_TABLET | Freq: Every day | ORAL | 0 refills | Status: DC

## 2017-11-23 MED ORDER — INSULIN STARTER KIT- SYRINGES (ENGLISH): 1.0000 | Freq: Once | 0 refills | Status: AC

## 2017-11-23 MED ORDER — INSULIN ASPART 100 UNIT/ML ~~LOC~~ SOLN
0.0000 [IU] | Freq: Three times a day (TID) | SUBCUTANEOUS | Status: DC
  Administered 2017-11-23: 2 [IU] via SUBCUTANEOUS

## 2017-11-23 MED ORDER — ACCU-CHEK SOFT TOUCH LANCETS MISC: 5 refills | Status: DC

## 2017-11-23 MED ORDER — ADULT MULTIVITAMIN W/MINERALS CH: 1.0000 | ORAL_TABLET | Freq: Every day | ORAL | 0 refills | Status: DC

## 2017-11-23 MED ORDER — FOLIC ACID 1 MG PO TABS: 1.0000 mg | ORAL_TABLET | Freq: Every day | ORAL | 0 refills | Status: DC

## 2017-11-23 MED ORDER — INSULIN ASPART 100 UNIT/ML ~~LOC~~ SOLN: 0.0000 [IU] | Freq: Every day | SUBCUTANEOUS | Status: DC

## 2017-11-23 MED ORDER — ACCU-CHEK SOFT TOUCH LANCETS MISC: 0 refills | Status: DC

## 2017-11-23 MED ORDER — INSULIN ASPART 100 UNIT/ML ~~LOC~~ SOLN: 8.0000 [IU] | Freq: Three times a day (TID) | SUBCUTANEOUS | 0 refills | Status: DC

## 2017-11-23 MED ORDER — PANTOPRAZOLE SODIUM 40 MG PO TBEC: 40.0000 mg | DELAYED_RELEASE_TABLET | Freq: Every day | ORAL | 0 refills | Status: DC

## 2017-11-23 NOTE — Progress Notes (Signed)
Pt's co- pay for insulin is zero. Pt is aware.

## 2017-11-23 NOTE — Discharge Summary (Addendum)
Discharge Summary  Barry Pittman ACZ:[id number] DOB: 1985-12-09  PCP: Patient, No Pcp Per  Admit date: 11/18/2017 Discharge date: 11/23/2017  Time spent: 25 mins  Recommendations for Outpatient Follow-up:  1. Follow up with your endocrinologist 2. Follow up with your PCP  Discharge Diagnoses:  Active Hospital Problems   Diagnosis Date Noted  . DKA (diabetic ketoacidoses) ([place]) 05/26/2015  . DKA, type 1 ([place]) 09/22/2016  . Acute kidney injury ([place]) 05/27/2015  . Hyponatremia 05/27/2015  . Leukocytosis 05/27/2015  . Thrombocytosis ([place]) 05/27/2015  . Intractable nausea and vomiting 05/27/2015    Resolved Hospital Problems  No resolved problems to display.    Discharge Condition: Stable   Diet recommendation: Heart healthy carb diet   Vitals:   11/23/17 0421 11/23/17 1300  BP: 138/84 (!) 132/94  Pulse: 83 (!) 109  Resp: 18 18  Temp: 98.3 F (36.8 C) 98.9 F (37.2 C)  SpO2: 98% 99%    History of present illness:  Patient is a 32 year old male with past medical history of diabetes type 1, hypertension, alcohol abuse who presents to the emergency department with complaints of nausea and vomiting. Patient was found to have diabetic ketoacidosis on presentation and admitted for further management. Patient's anion gap had closed and he was moved to telemetry but had to be transferred back to ICU because his anion gap reopened.  Yesterday morning the anion gap closed again. Insulin drip discontinued andstarted on Lantus and sliding scale.  11/22/2017: Patient seen and examined at his bedside.  He has no new complaints however his anion gap reopened at 16 with chemistry bicarb of 15.  Denies abdominal pain or nausea.  11/23/17: Seen and examined at bedside. No new complaints.  On the day of discharge the patient was hemodynamically stable.  He will need to follow-up with his endocrinologist, PCP post hospitalization.  Hospital Course:  Principal Problem:   DKA  (diabetic ketoacidoses) (HCC) Active Problems:   Hyponatremia   Acute kidney injury (HCC)   Thrombocytosis (HCC)   Leukocytosis   Intractable nausea and vomiting   DKA, type 1 (HCC)   Type I DKA Completed DKA protocol Continue Lantus 50 units daily, NovoLog 8 units 3 times daily, and NovoLog insulin sliding scale 0 to 9 units Follow-up with your endocrinologist  Alcohol abuse with concern for withdrawal CIWA protocol in place Refrain from alcohol use  Pseudohyponatremia, resolved   Procedures:  None  Consultations:  Diabetes coordinator  Discharge Exam: BP (!) 132/94 (BP Location: Right Arm)   Pulse (!) 109   Temp 98.9 F (37.2 C) (Oral)   Resp 18   Ht 5' 11" (1.803 m)   Wt 97.5 kg   SpO2 99%   BMI 29.98 kg/m  . General: 32 y.o. year-old male well developed well nourished in no acute distress.  Alert and oriented x3. . Cardiovascular: Regular rate and rhythm with no rubs or gallops.  No thyromegaly or JVD noted.   Marland Kitchen Respiratory: Clear to auscultation with no wheezes or rales. Good inspiratory effort. . Abdomen: Soft nontender nondistended with normal bowel sounds x4 quadrants. . Musculoskeletal: No lower extremity edema. 2/4 pulses in all 4 extremities. . Skin: No ulcerative lesions noted or rashes, . Psychiatry: Mood is appropriate for condition and setting  Discharge Instructions You were cared for by a hospitalist during your hospital stay. If you have any questions about your discharge medications or the care you received while you were in the hospital after you are discharged, you  can call the unit and asked to speak with the hospitalist on call if the hospitalist that took care of you is not available. Once you are discharged, your primary care physician will handle any further medical issues. Please note that NO REFILLS for any discharge medications will be authorized once you are discharged, as it is imperative that you return to your primary care  physician (or establish a relationship with a primary care physician if you do not have one) for your aftercare needs so that they can reassess your need for medications and monitor your lab values.   Allergies as of 11/23/2017      Reactions   Novolog [insulin Aspart] Other (See Comments)   "goes into DKA"      Medication List    STOP taking these medications   glucose blood test strip   insulin lispro 100 UNIT/ML injection Commonly known as:  HUMALOG   insulin pump Soln     TAKE these medications   accu-chek soft touch lancets Use as directed.   blood glucose meter kit and supplies Kit Dispense based on patient and insurance preference. Use up to four times daily as directed. (FOR ICD-9 250.00, 250.01).   folic acid 1 MG tablet Commonly known as:  FOLVITE Take 1 tablet (1 mg total) by mouth daily.   glucagon 1 MG injection Inject 1 mg into the muscle once as needed. What changed:    when to take this  reasons to take this   insulin aspart 100 UNIT/ML injection Commonly known as:  novoLOG Inject 8 Units into the skin 3 (three) times daily with meals.   insulin aspart 100 UNIT/ML injection Commonly known as:  novoLOG Inject 0-9 Units into the skin 3 (three) times daily with meals.   insulin glargine 100 UNIT/ML injection Commonly known as:  LANTUS Inject 0.5 mLs (50 Units total) into the skin daily.   lisinopril 20 MG tablet Commonly known as:  PRINIVIL,ZESTRIL Take 1 tablet (20 mg total) by mouth daily.   multivitamin with minerals Tabs tablet Take 1 tablet by mouth daily.   pantoprazole 40 MG tablet Commonly known as:  PROTONIX Take 1 tablet (40 mg total) by mouth daily.   thiamine 100 MG tablet Take 1 tablet (100 mg total) by mouth daily.            Durable Medical Equipment  (From admission, onward)         Start     Ordered   11/23/17 1118  DME Glucometer  Once     11/23/17 1117         Allergies  Allergen Reactions  . Novolog  [Insulin Aspart] Other (See Comments)    "goes into DKA"   Follow-up Information    [facility]. Call in 1 day(s).   Why:  Please call for an appointment. Contact information: [place] [address] [phone]       Philemon Kingdom, MD. Call in 1 day(s).   Specialty:  Internal Medicine Why:  Please call for an appointment. Contact information: [address] [place] [address] [phone]            The results of significant diagnostics from this hospitalization (including imaging, microbiology, ancillary and laboratory) are listed below for reference.    Significant Diagnostic Studies: No results found.  Microbiology: Recent Results (from the past 240 hour(s))  Culture, blood (routine x 2)  Status: None   Collection Time: 11/18/17  6:02 PM  Result Value Ref Range Status   Specimen Description   Final    BLOOD LEFT WRIST Performed at [facility] [address], [place], [place] [address]    Special Requests   Final    BOTTLES DRAWN AEROBIC AND ANAEROBIC Blood Culture adequate volume Performed at [facility] [address] [place], [place] [address]    Culture   Final    NO GROWTH 5 DAYS Performed at [facility], [place] [address], [place], [place] [address]    Report Status 11/23/2017 FINAL  Final  Culture, blood (routine x 2)     Status: None (Preliminary result)   Collection Time: 11/19/17  6:28 AM  Result Value Ref Range Status   Specimen Description   Final    BLOOD RIGHT ARM Performed at [facility] [address], [place], [place] [address]    Special Requests   Final    BOTTLES DRAWN AEROBIC ONLY Blood Culture results may not be optimal due to an inadequate volume of blood received in culture bottles Performed at [facility] [address], [place], [place] [address]      Culture   Final    NO GROWTH 4 DAYS Performed at [facility], [place] [address], [place], [place] [address]    Report Status PENDING  Incomplete  MRSA PCR Screening     Status: None   Collection Time: 11/19/17  2:50 PM  Result Value Ref Range Status   MRSA by PCR NEGATIVE NEGATIVE Final    Comment:        The GeneXpert MRSA Assay (FDA approved for NASAL specimens only), is one component of a comprehensive MRSA colonization surveillance program. It is not intended to diagnose MRSA infection nor to guide or monitor treatment for MRSA infections. Performed at [facility], [place] [address], [place],  [address]      Labs: Basic Metabolic Panel: Recent Labs  Lab 11/18/17 1553 11/18/17 1843  11/22/17 1347 11/22/17 1812 11/22/17 2151 11/23/17 0154 11/23/17 0611  NA 130* 138   < > 136 137 136 136 133*  K 6.0* 5.3*   < > 3.7 3.6 3.2* 3.5 3.3*  CL 94* 104   < > 104 105 104 103 101  CO2 8* 8*   < > 20* 21* 21* 21* 19*  GLUCOSE 514* 415*   < > 217* 171* 131* 142* 190*  BUN 32* 32*   < > _0 CREATININE 1.60* 1.55*   < > 0.76 0.78 0.65 0.70 0.71  CALCIUM 9.0 8.9   < > 8.5* 8.4* 8.4* 8.4* 8.2*  MG 2.5* 2.6*  --   --   --   --   --   --   PHOS 5.5*  --   --   --   --   --   --   --    < > = values in this interval not displayed.   Liver Function Tests: Recent Labs  Lab 11/18/17 1553  AST 23  ALT 18  ALKPHOS 103  BILITOT 2.4*  PROT 8.5*  ALBUMIN 4.9   No results for input(s): LIPASE, AMYLASE in the last 168 hours. No results for input(s): AMMONIA in the last 168 hours. CBC: Recent Labs  Lab 11/18/17 1439 11/18/17 1843 11/20/17 0531  WBC 24.2* 24.6* 12.9*  NEUTROABS  21.5* 21.6* 10.8*  HGB 17.9* 16.6 16.2  HCT 51.9 49.6 48.8  MCV 86.5 88.1 86.8  PLT 360 345 320   Cardiac Enzymes: Recent Labs  Lab 11/18/17 1843 11/19/17 0114 11/19/17 0628  TROPONINI <0.03 <0.03 <0.03   BNP: BNP (last 3 results) No results for  input(s): BNP in the last 8760 hours.  ProBNP (last 3 results) No results for input(s): PROBNP in the last 8760 hours.  CBG: Recent Labs  Lab 11/22/17 1617 11/22/17 2017 11/22/17 2318 11/23/17 0725 11/23/17 1215  GLUCAP 174* 137* 116* 178* 196*       Signed:  Kayleen Memos, MD [facility] 11/23/2017, 2:30 PM

## 2017-11-23 NOTE — Progress Notes (Signed)
Inpatient Diabetes Program Recommendations  AACE/ADA: New Consensus Statement on Inpatient Glycemic Control (2015)  Target Ranges:  Prepandial:   less than 140 mg/dL      Peak postprandial:   less than 180 mg/dL (1-2 hours)      Critically ill patients:  140 - 180 mg/dL   Lab Results  Component Value Date   GLUCAP 178 (H) 11/23/2017   HGBA1C 12.6 (H) 11/19/2017    Review of Glycemic Control  Blood sugars much improved.  AG closed. CO2 - 19. Needs correction insulin.  Recommendations:  Increase Lantus to 50 units  Novolog 0-9 units tidwc and hs (in addition to Novolog 8 units tidwc)  Will talk with pt this morning and f/u about insulin pump.  Thank you. Lorenda Peck, RD, LDN, CDE Inpatient Diabetes Coordinator [phone]

## 2017-11-23 NOTE — Discharge Instructions (Signed)
Diabetic Ketoacidosis Diabetic ketoacidosis is a life-threatening complication of diabetes. If it is not treated, it can cause severe dehydration and organ damage and can lead to a coma or death. What are the causes? This condition develops when there is not enough of the hormone insulin in the body. Insulin helps the body to break down sugar for energy. Without insulin, the body cannot break down sugar, so it breaks down fats instead. This leads to the production of acids that are called ketones. Ketones are poisonous at high levels. This condition can be triggered by:  Stress on the body that is brought on by an illness.  Medicines that raise blood glucose levels.  Not taking diabetes medicine.  What are the signs or symptoms? Symptoms of this condition include:  Fatigue.  Weight loss.  Excessive thirst.  Light-headedness.  Fruity or sweet-smelling breath.  Excessive urination.  Vision changes.  Confusion or irritability.  Nausea.  Vomiting.  Rapid breathing.  Abdominal pain.  Feeling flushed.  How is this diagnosed? This condition is diagnosed based on a medical history, a physical exam, and blood tests. You may also have a urine test that checks for ketones. How is this treated? This condition may be treated with:  Fluid replacement. This may be done to correct dehydration.  Insulin injections. These may be given through the skin or through an IV tube.  Electrolyte replacement. Electrolytes, such as potassium and sodium, may be given in pill form or through an IV tube.  Antibiotic medicines. These may be prescribed if your condition was caused by an infection.  Follow these instructions at home: Eating and drinking  Drink enough fluids to keep your urine clear or pale yellow.  If you cannot eat, alternate between drinking fluids with sugar (such as juice) and salty fluids (such as broth or bouillon).  If you can eat, follow your usual diet and drink  sugar-free liquids, such as water. Other Instructions   Take insulin as directed by your health care provider. Do not skip insulin injections. Do not use expired insulin.  If your blood sugar is over 240 mg/dL, monitor your urine ketones every 4-6 hours.  If you were prescribed an antibiotic medicine, finish all of it even if you start to feel better.  Rest and exercise only as directed by your health care provider.  If you get sick, call your health care provider and begin treatment quickly. Your body often needs extra insulin to fight an illness.  Check your blood glucose levels regularly. If your blood glucose is high, drink plenty of fluids. This helps to flush out ketones. Contact a health care provider if:  Your blood glucose level is too high or too low.  You have ketones in your urine.  You have a fever.  You cannot eat.  You cannot tolerate fluids.  You have been vomiting for more than 2 hours.  You continue to have symptoms of this condition.  You develop new symptoms. Get help right away if:  Your blood glucose levels continue to be high (elevated).  Your monitor reads high even when you are taking insulin.  You faint.  You have chest pain.  You have trouble breathing.  You have a sudden, severe headache.  You have sudden weakness in one arm or one leg.  You have sudden trouble speaking or swallowing.  You have vomiting or diarrhea that gets worse after 3 hours.  You feel severely fatigued.  You have trouble thinking.  You  have abdominal pain.  You are severely dehydrated. Symptoms of severe dehydration include: ? Extreme thirst. ? Dry mouth. ? Blue lips. ? Cold hands and feet. ? Rapid breathing. This information is not intended to replace advice given to you by your health care provider. Make sure you discuss any questions you have with your health care provider. Document Released: 04/01/2000 Document Revised: 09/10/2015 Document  Reviewed: 03/12/2014 Elsevier Interactive Patient Education  2017 Elsevier Inc[employer]   Type 1 Diabetes Mellitus, Self Care, Adult When you have type 1 diabetes (type 1 diabetes mellitus), you must keep your blood sugar (glucose) under control. You can do this with:  Insulin.  Nutrition.  Exercise.  Lifestyle changes.  Other medicines, if needed.  Support from your doctors and others.  How do I manage my blood sugar?  Check your blood sugar every day, as often as told.  Call your doctor if your blood sugar is above your goal numbers for 2 tests in a row.  Have your A1c (hemoglobin A1c) level checked at least twice a year. Have it checked more often if your doctor tells you to. Your doctor will set treatment goals for you. Generally, you should have these blood sugar levels:  Before meals (preprandial): 80-130 mg/dL (4.4-7.2 mmol/L).  After meals (postprandial): below 180 mg/dL (10 mmol/L).  A1c level: less than 7%.  What do I need to know about high blood sugar? High blood sugar is called hyperglycemia. Know the signs of high blood sugar. Signs may include:  Feeling: ? Thirsty. ? Hungry. ? Very tired.  Needing to pee (urinate) more than usual.  Blurry vision.  What do I need to know about low blood sugar? Low blood sugar is called hypoglycemia. This is when blood sugar is at or below 70 mg/dL (3.9 mmol/L). Symptoms may include:  Feeling: ? Hungry. ? Worried or nervous (anxious). ? Sweaty and clammy. ? Confused. ? Dizzy. ? Sleepy. ? Sick to your stomach (nauseous).  Having: ? A fast heartbeat. ? A headache. ? A change in your vision. ? Jerky movements that you cannot control (seizure). ? Nightmares. ? Tingling or no feeling (numbness) around the mouth, lips, or tongue.  Having trouble with: ? Talking. ? Paying attention (concentrating). ? Moving (coordination). ? Sleeping.  Shaking.  Passing out (fainting).  Getting upset easily  (irritability).  Treating low blood sugar  To treat low blood sugar, eat or drink something sugary right away. If you can think clearly and swallow safely, follow the 15:15 rule:  Take 15 grams of a fast-acting carb (carbohydrate). Some fast-acting carbs are: ? 1 tube of glucose gel. ? 3 sugar tablets (glucose pills). ? 6-8 pieces of hard candy. ? 4 oz (120 mL) of fruit juice. ? 4 oz (120 mL) regular (not diet) soda.  Check your blood sugar 15 minutes after you take the carb.  If your blood sugar is still at or below 70 mg/dL (3.9 mmol/L), take 15 grams of a carb again.  If your blood sugar does not go above 70 mg/dL (3.9 mmol/L) after 3 tries, get help right away.  After your blood sugar goes back to normal, eat a meal or a snack within 1 hour.  Treating very low blood sugar If your blood sugar is at or below 54 mg/dL (3 mmol/L), you have very low blood sugar (severe hypoglycemia). This is an emergency. Do not wait to see if the symptoms will go away. Get medical help right away. Call your local emergency  services (911 in the [place] Do not drive yourself to the hospital. If you have very low blood sugar and you cannot eat or drink, you may need a glucagon shot (injection). A family member or friend should learn how to check your blood sugar and how to give you a glucagon shot. Ask your doctor if you need to have a glucagon shot kit at home. What else is important to manage my diabetes? Medicine  Take insulin and diabetes medicines as told.  Adjust your insulin and medicines as told.  Do not run out of insulin or medicines. Having diabetes can put you at risk for other long-term (chronic) conditions. These may include heart disease and kidney disease. Your doctor may prescribe medicines to help prevent problems from diabetes. Food  Make healthy food choices. These include: ? Chicken, fish, egg whites, and beans. ? Oats, whole wheat, bulgur, brown rice, quinoa, and  millet. ? Fresh fruits and vegetables. ? Low-fat dairy products. ? Nuts, avocado, olive oil, and canola oil.  Meet with a food specialist (registered dietitian). He or she can help you make an eating plan that is right for you.  Follow instructions from your doctor about what you cannot eat or drink.  Drink enough fluid to keep your pee (urine) clear or pale yellow.  Eat healthy snacks between healthy meals.  Keep track of carbs that you eat. Do this by reading food labels and learning food serving sizes.  Follow your sick day plan when you cannot eat or drink normally. Make this plan with your doctor so it is ready to use. Activity   Exercise at least 3 times a week.  Do not go more than 2 days without exercising.  Talk with your doctor before you start a new exercise. Your doctor may need to adjust your insulin, medicines, or food. Lifestyle   Do not use any tobacco products. These include cigarettes, chewing tobacco, and e-cigarettes. If you need help quitting, ask your doctor.  Ask your doctor how much alcohol is safe for you.  Learn to deal with stress. If you need help with this, ask your doctor. Body care  Stay up to date with your shots (immunizations).  Have your eyes and feet checked by a doctor as often as told.  Check your skin and feet every day. Check for cuts, bruises, redness, blisters, or sores.  Brush your teeth and gums two times a day, and floss at least one time a day.  Go to the dentist least one time every 6 months.  Stay at a healthy weight. General instructions   Take over-the-counter and prescription medicines only as told by your doctor.  Share your diabetes care plan with: ? Your work or school. ? People you live with.  Check your pee (urine) for ketones: ? When you are sick. ? As told by your doctor.  Carry a card or wear jewelry that says that you have diabetes.  Ask your doctor: ? Do I need to meet with a diabetes  educator? ? Where can I find a support group for people with diabetes?  Keep all follow-up visits as told by your doctor. This is important. Where to find more information: To learn more about diabetes, visit:  American Diabetes Association: www.diabetes.org  American Association of Diabetes Educators: www.diabeteseducator.org/patient-resources  This information is not intended to replace advice given to you by your health care provider. Make sure you discuss any questions you have with your health care provider. Document  Released: 07/27/2015 Document Revised: 09/10/2015 Document Reviewed: 05/08/2015 Elsevier Interactive Patient Education  [employer].

## 2017-11-23 NOTE — Care Management (Signed)
11-23-17  BENEFITS CHECK :  S/W ELENA  @ [facility] RX # # [phone]  INSULIN: 1. LANTUS   SOLORTAR  (no dosage ) COVER- YES PRIOR APPROVAL- NO   2. NOVOLOG  FLEX PEN ( no dosage ) COVER- YES PRIOR APPROVAL- NO  PREFERRED PHARMACY : YES       [facility]

## 2017-11-24 LAB — CULTURE, BLOOD (ROUTINE X 2): CULTURE: NO GROWTH

## 2017-11-26 ENCOUNTER — Other Ambulatory Visit: Payer: Self-pay

## 2017-11-26 ENCOUNTER — Encounter (HOSPITAL_COMMUNITY): Payer: Self-pay

## 2017-11-26 ENCOUNTER — Inpatient Hospital Stay (HOSPITAL_COMMUNITY)
Admission: EM | Admit: 2017-11-26 | Discharge: 2017-11-28 | DRG: 638 | Disposition: A | Discharge status: Home / Self Care | Payer: 59 | Attending: Internal Medicine | Admitting: Internal Medicine

## 2017-11-26 DIAGNOSIS — E10319 Type 1 diabetes mellitus with unspecified diabetic retinopathy without macular edema: Secondary | ICD-10-CM | POA: Diagnosis present

## 2017-11-26 DIAGNOSIS — E1010 Type 1 diabetes mellitus with ketoacidosis without coma: Secondary | ICD-10-CM | POA: Diagnosis not present

## 2017-11-26 DIAGNOSIS — E1110 Type 2 diabetes mellitus with ketoacidosis without coma: Secondary | ICD-10-CM | POA: Diagnosis present

## 2017-11-26 DIAGNOSIS — Z833 Family history of diabetes mellitus: Secondary | ICD-10-CM

## 2017-11-26 DIAGNOSIS — I10 Essential (primary) hypertension: Secondary | ICD-10-CM | POA: Diagnosis present

## 2017-11-26 DIAGNOSIS — E860 Dehydration: Secondary | ICD-10-CM | POA: Diagnosis present

## 2017-11-26 DIAGNOSIS — D473 Essential (hemorrhagic) thrombocythemia: Secondary | ICD-10-CM | POA: Diagnosis not present

## 2017-11-26 DIAGNOSIS — Z79899 Other long term (current) drug therapy: Secondary | ICD-10-CM | POA: Diagnosis not present

## 2017-11-26 DIAGNOSIS — D72829 Elevated white blood cell count, unspecified: Secondary | ICD-10-CM | POA: Diagnosis not present

## 2017-11-26 DIAGNOSIS — E871 Hypo-osmolality and hyponatremia: Secondary | ICD-10-CM | POA: Diagnosis present

## 2017-11-26 DIAGNOSIS — Z888 Allergy status to other drugs, medicaments and biological substances status: Secondary | ICD-10-CM

## 2017-11-26 DIAGNOSIS — D75839 Thrombocytosis, unspecified: Secondary | ICD-10-CM | POA: Diagnosis present

## 2017-11-26 DIAGNOSIS — K219 Gastro-esophageal reflux disease without esophagitis: Secondary | ICD-10-CM | POA: Diagnosis present

## 2017-11-26 LAB — I-STAT CHEM 8, ED
BUN: 12 mg/dL (ref 6–20)
CHLORIDE: 97 mmol/L — AB (ref 98–111)
CREATININE: 0.7 mg/dL (ref 0.61–1.24)
Calcium, Ion: 1.14 mmol/L — ABNORMAL LOW (ref 1.15–1.40)
GLUCOSE: 347 mg/dL — AB (ref 70–99)
HCT: 51 % (ref 39.0–52.0)
Hemoglobin: 17.3 g/dL — ABNORMAL HIGH (ref 13.0–17.0)
POTASSIUM: 3.7 mmol/L (ref 3.5–5.1)
Sodium: 134 mmol/L — ABNORMAL LOW (ref 135–145)
TCO2: 14 mmol/L — ABNORMAL LOW (ref 22–32)

## 2017-11-26 LAB — BASIC METABOLIC PANEL
ANION GAP: 17 — AB (ref 5–15)
Anion gap: 26 — ABNORMAL HIGH (ref 5–15)
BUN: 13 mg/dL (ref 6–20)
BUN: 11 mg/dL (ref 6–20)
CALCIUM: 8.4 mg/dL — AB (ref 8.9–10.3)
CHLORIDE: 97 mmol/L — AB (ref 98–111)
CO2: 13 mmol/L — AB (ref 22–32)
CO2: 12 mmol/L — ABNORMAL LOW (ref 22–32)
CREATININE: 0.85 mg/dL (ref 0.61–1.24)
Calcium: 9.5 mg/dL (ref 8.9–10.3)
Chloride: 107 mmol/L (ref 98–111)
Creatinine, Ser: 1.07 mg/dL (ref 0.61–1.24)
GFR calc Af Amer: >60 mL/min (ref >60)
GFR calc non Af Amer: >60 mL/min (ref >60)
GFR calc non Af Amer: >60 mL/min (ref >60)
GLUCOSE: 344 mg/dL — AB (ref 70–99)
Glucose, Bld: 163 mg/dL — ABNORMAL HIGH (ref 70–99)
POTASSIUM: 3.8 mmol/L (ref 3.5–5.1)
Potassium: 4.5 mmol/L (ref 3.5–5.1)
SODIUM: 136 mmol/L (ref 135–145)
SODIUM: 136 mmol/L (ref 135–145)

## 2017-11-26 LAB — BLOOD GAS, VENOUS
Acid-base deficit: 10.4 mmol/L — ABNORMAL HIGH (ref 0.0–2.0)
BICARBONATE: 14.1 mmol/L — AB (ref 20.0–28.0)
O2 Saturation: 45 %
PCO2 VEN: 29 mmHg — AB (ref 44.0–60.0)
Patient temperature: 98.6
pH, Ven: 7.309 (ref 7.250–7.430)

## 2017-11-26 LAB — HEPATIC FUNCTION PANEL
ALT: 16 U/L (ref 0–44)
AST: 18 U/L (ref 15–41)
Albumin: 4.2 g/dL (ref 3.5–5.0)
Alkaline Phosphatase: 69 U/L (ref 38–126)
Bilirubin, Direct: 0.1 mg/dL (ref 0.0–0.2)
Indirect Bilirubin: 1.3 mg/dL — ABNORMAL HIGH (ref 0.3–0.9)
TOTAL PROTEIN: 7.8 g/dL (ref 6.5–8.1)
Total Bilirubin: 1.4 mg/dL — ABNORMAL HIGH (ref 0.3–1.2)

## 2017-11-26 LAB — GLUCOSE, CAPILLARY
GLUCOSE-CAPILLARY: 135 mg/dL — AB (ref 70–99)
GLUCOSE-CAPILLARY: 157 mg/dL — AB (ref 70–99)
GLUCOSE-CAPILLARY: 191 mg/dL — AB (ref 70–99)
GLUCOSE-CAPILLARY: 170 mg/dL — AB (ref 70–99)
Glucose-Capillary: 144 mg/dL — ABNORMAL HIGH (ref 70–99)
Glucose-Capillary: 156 mg/dL — ABNORMAL HIGH (ref 70–99)
Glucose-Capillary: 194 mg/dL — ABNORMAL HIGH (ref 70–99)
Glucose-Capillary: 206 mg/dL — ABNORMAL HIGH (ref 70–99)

## 2017-11-26 LAB — CBG MONITORING, ED: Glucose-Capillary: 349 mg/dL — ABNORMAL HIGH (ref 70–99)

## 2017-11-26 LAB — CBC
HEMATOCRIT: 48.6 % (ref 39.0–52.0)
Hemoglobin: 16.9 g/dL (ref 13.0–17.0)
MCH: 29.3 pg (ref 26.0–34.0)
MCHC: 34.8 g/dL (ref 30.0–36.0)
MCV: 84.2 fL (ref 78.0–100.0)
Platelets: 426 10*3/uL — ABNORMAL HIGH (ref 150–400)
RBC: 5.77 MIL/uL (ref 4.22–5.81)
RDW: 12.5 % (ref 11.5–15.5)
WBC: 17.6 10*3/uL — AB (ref 4.0–10.5)

## 2017-11-26 LAB — URINALYSIS, ROUTINE W REFLEX MICROSCOPIC
Bacteria, UA: NONE SEEN
Bilirubin Urine: NEGATIVE
Glucose, UA: >=500 mg/dL — AB
HGB URINE DIPSTICK: NEGATIVE
Ketones, ur: 80 mg/dL — AB
LEUKOCYTES UA: NEGATIVE
Nitrite: NEGATIVE
PH: 6 (ref 5.0–8.0)
Protein, ur: NEGATIVE mg/dL
SPECIFIC GRAVITY, URINE: 1.027 (ref 1.005–1.030)

## 2017-11-26 LAB — PHOSPHORUS: PHOSPHORUS: 3.2 mg/dL (ref 2.5–4.6)

## 2017-11-26 LAB — I-STAT TROPONIN, ED: Troponin i, poc: 0 ng/mL (ref 0.00–0.08)

## 2017-11-26 LAB — MAGNESIUM: Magnesium: 2 mg/dL (ref 1.7–2.4)

## 2017-11-26 MED ORDER — ONDANSETRON HCL 4 MG/2ML IJ SOLN
4.0000 mg | Freq: Once | INTRAMUSCULAR | Status: AC
  Administered 2017-11-26: 4 mg via INTRAVENOUS
  Filled 2017-11-26: qty 2

## 2017-11-26 MED ORDER — DEXTROSE-NACL 5-0.45 % IV SOLN: INTRAVENOUS | Status: DC

## 2017-11-26 MED ORDER — ADULT MULTIVITAMIN W/MINERALS CH
1.0000 | ORAL_TABLET | Freq: Every day | ORAL | Status: DC
Start: 2017-11-27 — End: 2017-11-28
  Administered 2017-11-27 – 2017-11-28 (×2): 1 via ORAL
  Filled 2017-11-26 (×2): qty 1

## 2017-11-26 MED ORDER — HEPARIN SODIUM (PORCINE) 5000 UNIT/ML IJ SOLN: 5000.0000 [IU] | Freq: Three times a day (TID) | INTRAMUSCULAR | Status: DC

## 2017-11-26 MED ORDER — ONDANSETRON HCL 4 MG/2ML IJ SOLN
4.0000 mg | Freq: Four times a day (QID) | INTRAMUSCULAR | Status: DC | PRN
  Administered 2017-11-27: 4 mg via INTRAVENOUS
  Filled 2017-11-26: qty 2

## 2017-11-26 MED ORDER — ONDANSETRON HCL 4 MG PO TABS: 4.0000 mg | ORAL_TABLET | Freq: Four times a day (QID) | ORAL | Status: DC | PRN

## 2017-11-26 MED ORDER — SODIUM CHLORIDE 0.9 % IV SOLN
INTRAVENOUS | Status: DC | PRN
  Administered 2017-11-26: 1000 mL via INTRAVENOUS

## 2017-11-26 MED ORDER — FAMOTIDINE IN NACL 20-0.9 MG/50ML-% IV SOLN
20.0000 mg | Freq: Once | INTRAVENOUS | Status: AC
  Administered 2017-11-26: 20 mg via INTRAVENOUS
  Filled 2017-11-26: qty 50

## 2017-11-26 MED ORDER — SODIUM CHLORIDE 0.9 % IV BOLUS
1000.0000 mL | Freq: Once | INTRAVENOUS | Status: AC
  Administered 2017-11-26: 1000 mL via INTRAVENOUS

## 2017-11-26 MED ORDER — SODIUM CHLORIDE 0.9 % IV SOLN
INTRAVENOUS | Status: DC
  Administered 2017-11-26: 0.8 [IU]/h via INTRAVENOUS
  Filled 2017-11-26: qty 1

## 2017-11-26 MED ORDER — LISINOPRIL 10 MG PO TABS
20.0000 mg | ORAL_TABLET | Freq: Every day | ORAL | Status: DC
  Administered 2017-11-27 – 2017-11-28 (×2): 20 mg via ORAL
  Filled 2017-11-26 (×2): qty 2

## 2017-11-26 MED ORDER — SODIUM CHLORIDE 0.9 % IV SOLN: INTRAVENOUS | Status: DC

## 2017-11-26 MED ORDER — POTASSIUM CHLORIDE 10 MEQ/100ML IV SOLN
10.0000 meq | INTRAVENOUS | Status: AC
  Administered 2017-11-26 (×2): 10 meq via INTRAVENOUS
  Filled 2017-11-26 (×2): qty 100

## 2017-11-26 MED ORDER — GI COCKTAIL ~~LOC~~
30.0000 mL | Freq: Once | ORAL | Status: AC
  Administered 2017-11-26: 30 mL via ORAL
  Filled 2017-11-26: qty 30

## 2017-11-26 MED ORDER — SODIUM CHLORIDE 0.9 % IV SOLN
INTRAVENOUS | Status: AC
  Administered 2017-11-26: 17:00:00 via INTRAVENOUS

## 2017-11-26 MED ORDER — VITAMIN B-1 100 MG PO TABS
100.0000 mg | ORAL_TABLET | Freq: Every day | ORAL | Status: DC
  Administered 2017-11-27 – 2017-11-28 (×2): 100 mg via ORAL
  Filled 2017-11-26 (×2): qty 1

## 2017-11-26 MED ORDER — PANTOPRAZOLE SODIUM 40 MG PO TBEC
40.0000 mg | DELAYED_RELEASE_TABLET | Freq: Every day | ORAL | Status: DC
  Administered 2017-11-27 – 2017-11-28 (×2): 40 mg via ORAL
  Filled 2017-11-26 (×2): qty 1

## 2017-11-26 MED ORDER — DEXTROSE-NACL 5-0.45 % IV SOLN
INTRAVENOUS | Status: DC
  Administered 2017-11-26 – 2017-11-27 (×2): via INTRAVENOUS
  Administered 2017-11-27: 1000 mL via INTRAVENOUS

## 2017-11-26 MED ORDER — SODIUM CHLORIDE 0.45 % IV BOLUS
1000.0000 mL | Freq: Once | INTRAVENOUS | Status: AC
  Administered 2017-11-26: 1000 mL via INTRAVENOUS

## 2017-11-26 MED ORDER — SODIUM CHLORIDE 0.9 % IV SOLN: Freq: Once | INTRAVENOUS | Status: AC

## 2017-11-26 NOTE — ED Notes (Signed)
Pt provided with blanket and dimmed lights per request. Pt in NAD

## 2017-11-26 NOTE — H&P (Signed)
History and Physical    Barry Pittman DTO:[id number] DOB: 16-Jan-1986 DOA: 11/26/2017  PCP: Patient, No Pcp Per   Patient coming from: Home.  I have personally briefly reviewed patient's old medical records in [place]  Chief Complaint: "I think coming DKA."  HPI: Barry Pittman is a 32 y.o. male with medical history significant of type 1 diabetes, hypertension, recently admitted and discharged for DKA from 11/18/2017 to 11/23/2017 due to DKA.  He initially came in having an insulin pump, but was discharged home on a long-acting insulin.  He states that yesterday he started having nausea followed by at least 6 episodes of emesis and persistent abdominal pain.  He has being unable to retaining anything he drinks or eat in his stomach.  He complained earlier of burning chest pain due to vomiting.  He states that this has been relief with GI cocktail and famotidine.  He has had increased urination.  He denies fever, chills, rhinorrhea, dyspnea, wheezing, typical chest pain, palpitations, dizziness diaphoresis, PND, orthopnea or pitting edema of the lower extremities.  No diarrhea, constipation, melena or hematochezia.  No dysuria, frequency or hematuria.  No heat or cold intolerance.  ED Course: Initial vital signs temperature 98.4 F, pulse 127, respirations 14, blood pressure 144/85 mmHg and O2 sat 100% on room air.  He was given 2000 mL of NS bolus, 30 mL of GI cocktail, 4 mg of Zofran IVPx1 and was started on insulin infusion.  His urinalysis showed a glucose of more than 500 and ketones of 80 mg/dL.  His venous gas showed a pH of 7.309, PCO2 of 29.0, measurable PCO2, bicarbonate of 14.1 and acid base deficit of 10.4 mmol/L.Sodium 136, potassium 3.8, chloride 97 and CO2 13 mmol/L.  Anion gap was 26.  BUN 13, creatinine 1.07 and glucose 344 mg/dL.  His white count 17.6, hemoglobin 16.9 g/dL and platelets 426.  EKG shows sinus tachycardia at 125 bpm.  Review of Systems: As per HPI  otherwise 10 point review of systems negative.   Past Medical History:  Diagnosis Date  . Diabetes mellitus without complication ([place])   . Hypertension     Past Surgical History:  Procedure Laterality Date  . EYE SURGERY     For retinopathy      reports that he has never smoked. He has never used smokeless tobacco. He reports that he drinks alcohol. He reports that he does not use drugs.  Allergies  Allergen Reactions  . Novolog [Insulin Aspart] Other (See Comments)    "goes into DKA"    Family History  Problem Relation Age of Onset  . Diabetes Mother   . Heart disease Mother   . Diabetes Father   . Heart disease Father     Prior to Admission medications   Medication Sig Start Date End Date Taking? Authorizing Provider  blood glucose meter kit and supplies KIT Dispense based on patient and insurance preference. Use up to four times daily as directed. (FOR ICD-9 250.00, 250.01). 11/23/17  Yes Kayleen Memos, DO  folic acid (FOLVITE) 1 MG tablet Take 1 tablet (1 mg total) by mouth daily. 11/23/17  Yes Hall, Carole N, DO  glucagon (GLUCAGON EMERGENCY) 1 MG injection Inject 1 mg into the muscle once as needed. Patient taking differently: Inject 1 mg into the muscle daily as needed (low blood sugar).  06/20/16  Yes Philemon Kingdom, MD  insulin glargine (LANTUS) 100 UNIT/ML injection Inject 0.5 mLs (50 Units total) into the  skin daily. 11/23/17  Yes Hall, Carole N, DO  insulin lispro (HUMALOG) 100 UNIT/ML injection Inject 0-9 Units into the skin 3 (three) times daily before meals.   Yes [provider]  Lancets (ACCU-CHEK SOFT TOUCH) lancets Use as directed. 11/23/17  Yes Hall, Carole N, DO  lisinopril (PRINIVIL,ZESTRIL) 20 MG tablet Take 1 tablet (20 mg total) by mouth daily. 06/20/16  Yes Philemon Kingdom, MD  Multiple Vitamin (MULTIVITAMIN WITH MINERALS) TABS tablet Take 1 tablet by mouth daily. 11/23/17  Yes Hall, Carole N, DO  pantoprazole (PROTONIX) 40 MG tablet Take 1 tablet  (40 mg total) by mouth daily. 11/23/17  Yes Irene Pap N, DO  thiamine 100 MG tablet Take 1 tablet (100 mg total) by mouth daily. 11/23/17  Yes Hall, Carole N, DO  insulin aspart (NOVOLOG) 100 UNIT/ML injection Inject 8 Units into the skin 3 (three) times daily with meals. Patient not taking: Reported on 11/26/2017 11/23/17   Kayleen Memos, DO  insulin aspart (NOVOLOG) 100 UNIT/ML injection Inject 0-9 Units into the skin 3 (three) times daily with meals. Patient not taking: Reported on 11/26/2017 11/23/17   Kayleen Memos, DO    Physical Exam: Vitals:   11/26/17 1347 11/26/17 1348  BP: (!) 144/85   Pulse: (!) 127   Resp: 14   Temp: 98.4 F (36.9 C)   TempSrc: Oral   SpO2: 100%   Weight:  97.5 kg  Height:  '5\' 11"'$  (1.803 m)    Constitutional: NAD, calm, comfortable Eyes: PERRL, lids and conjunctivae normal ENMT: Mucous membranes and lips are dry. Posterior pharynx clear of any exudate or lesions.Normal dentition.  Neck: normal, supple, no masses, no thyromegaly Respiratory: clear to auscultation bilaterally, no wheezing, no crackles. Normal respiratory effort. No accessory muscle use.  Cardiovascular: Tachycardic at 118 bpm, no murmurs / rubs / gallops. No extremity edema. 2+ pedal pulses. No carotid bruits.  Abdomen: Soft, mild epigastric tenderness, no masses palpated. No hepatosplenomegaly. Bowel sounds positive.  Musculoskeletal: no clubbing / cyanosis. No joint deformity upper and lower extremities. Good ROM, no contractures. Normal muscle tone.  Skin: no rashes, lesions, ulcers on limited dermatological examination. Neurologic: CN 2-12 grossly intact. Sensation intact, DTR normal. Strength 5/5 in all 4.  Psychiatric: Normal judgment and insight. Alert and oriented x 3. Normal mood.   Labs on Admission: I have personally reviewed following labs and imaging studies  CBC: Recent Labs  Lab 11/20/17 0531 11/26/17 1442 11/26/17 1451  WBC 12.9* 17.6*  --   NEUTROABS 10.8*  --   --     HGB 16.2 16.9 17.3*  HCT 48.8 48.6 51.0  MCV 86.8 84.2  --   PLT 320 426*  --    Basic Metabolic Panel: Recent Labs  Lab 11/22/17 1812 11/22/17 2151 11/23/17 0154 11/23/17 0611 11/26/17 1442 11/26/17 1451  NA 137 136 136 133* 136 134*  K 3.6 3.2* 3.5 3.3* 3.8 3.7  CL 105 104 103 101 97* 97*  CO2 21* 21* 21* 19* 13*  --   GLUCOSE 171* 131* 142* 190* 344* 347*  BUN '12 11 11 11 13 12  '$ CREATININE 0.78 0.65 0.70 0.71 1.07 0.70  CALCIUM 8.4* 8.4* 8.4* 8.2* 9.5  --    GFR: Estimated Creatinine Clearance: 157.9 mL/min (by C-G formula based on SCr of 0.7 mg/dL). Liver Function Tests: No results for input(s): AST, ALT, ALKPHOS, BILITOT, PROT, ALBUMIN in the last 168 hours. No results for input(s): LIPASE, AMYLASE in the last 168  hours. No results for input(s): AMMONIA in the last 168 hours. Coagulation Profile: No results for input(s): INR, PROTIME in the last 168 hours. Cardiac Enzymes: No results for input(s): CKTOTAL, CKMB, CKMBINDEX, TROPONINI in the last 168 hours. BNP (last 3 results) No results for input(s): PROBNP in the last 8760 hours. HbA1C: No results for input(s): HGBA1C in the last 72 hours. CBG: Recent Labs  Lab 11/22/17 2017 11/22/17 2318 11/23/17 0725 11/23/17 1215 11/26/17 1353  GLUCAP 137* 116* 178* 196* 349*   Lipid Profile: No results for input(s): CHOL, HDL, LDLCALC, TRIG, CHOLHDL, LDLDIRECT in the last 72 hours. Thyroid Function Tests: No results for input(s): TSH, T4TOTAL, FREET4, T3FREE, THYROIDAB in the last 72 hours. Anemia Panel: No results for input(s): VITAMINB12, FOLATE, FERRITIN, TIBC, IRON, RETICCTPCT in the last 72 hours. Urine analysis:    Component Value Date/Time   COLORURINE STRAW (A) 11/26/2017 1349   APPEARANCEUR CLEAR 11/26/2017 1349   LABSPEC 1.027 11/26/2017 1349   PHURINE 6.0 11/26/2017 1349   GLUCOSEU >=500 (A) 11/26/2017 1349   HGBUR NEGATIVE 11/26/2017 1349   BILIRUBINUR NEGATIVE 11/26/2017 1349   KETONESUR 80 (A)  11/26/2017 1349   PROTEINUR NEGATIVE 11/26/2017 1349   NITRITE NEGATIVE 11/26/2017 1349   LEUKOCYTESUR NEGATIVE 11/26/2017 1349    Radiological Exams on Admission: No results found.  EKG: Independently reviewed. Vent. rate 125 BPM PR interval * ms QRS duration 90 ms QT/QTc 320/462 ms P-R-T axes 86 81 3 Sinus tachycardia  Assessment/Plan Principal Problem:   DKA (diabetic ketoacidoses) (HCC) Admit to stepdown/inpatient. N.p.o. for now. Continue IV fluids. Continue insulin infusion. Replace electrolytes as needed. Monitor CBG, electrolytes and anion gap. Resume diet and long-acting insulin or insulin pump tomorrow.  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 Continue DKA treatment as above. Carbohydrate modified diet. Insulin glargine versus insulin pump?    Hyponatremia Pseudohyponatremia secondary to hyperglycemia. Continue IV fluids and insulin infusion.    Thrombocytosis (HCC) Continue IV fluids. Follow-up platelet levels in a.m.    Leukocytosis Secondary to stress and hemoconcentration. No fever, no chills, no seem to show signs of infection. Follow-up CBC in the morning.    Hypertension Continue lisinopril 20 mg p.o. daily. Monitor blood pressure, renal function electrolytes per   DVT prophylaxis:  Heparin SQ. Code Status: Full code.  Family Communication:  Disposition Plan: Admit to SDU for DKA treatment. Consults called: Admission status: Inpatient/stepdown.   Reubin Milan MD [facility] Pager [phone].  If 7PM-7AM, please contact night-coverage [email or web address] Password [facility]  11/26/2017, 3:46 PM

## 2017-11-26 NOTE — ED Triage Notes (Addendum)
Pt states "I think I'm in DKA." Increased N/V since yesterday, was recently hospitalized for DKA. Highest CBG at home 300. Pt no longer on insulin pump. Pt also c/o generalized chest pain x 1.5 days like needles sticking him in the chest.

## 2017-11-26 NOTE — Plan of Care (Signed)
  Problem: Safety: Goal: Ability to remain free from injury will improve Outcome: Progressing   

## 2017-11-26 NOTE — ED Provider Notes (Signed)
[facility] DEPT Provider Note   CSN: [id number] Arrival date & time: 11/26/17  1332     History   Chief Complaint Chief Complaint  Patient presents with  . Emesis  . Hyperglycemia  . Chest Pain    HPI Barry Pittman is a 32 y.o. male.  The history is provided by the patient. No language interpreter was used.  Emesis    Hyperglycemia  Associated symptoms: chest pain and vomiting   Chest Pain   Associated symptoms include vomiting.   Barry Pittman is a 32 y.o. male who presents to the Emergency Department complaining of vomiting, chest pain. He has a history of diabetes and was recently admitted for DKA and discharged home three days ago. His insulin pump was misplaced and he was discharged home on long acting and sliding scale insulin's. A new pump arrived in the mail but he is not been able to see is endocrinologist yet to have this set up. He has been taking his insulin as directed. Since hospital discharge she did endorse some burning chest pain described as a reflex like sensation. Last night he developed numerous episodes of emesis and feels like he is going into DKA again. He denies any fevers, cough, shortness of breath, diarrhea. He stopped drinking alcohol after his last hospitalization. No tobacco or drug use.  Past Medical History:  Diagnosis Date  . Diabetes mellitus without complication ([place])   . Hypertension     Patient Active Problem List   Diagnosis Date Noted  . Hypertension 11/26/2017  . Chest pain 09/22/2016  . DKA, type 1 ([place]) 09/22/2016  . 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 Hyperkalemia 05/27/2015  . Hyponatremia 05/27/2015  . Acute kidney injury ([place]) 05/27/2015  . Thrombocytosis ([place]) 05/27/2015  . Leukocytosis 05/27/2015  . Intractable nausea and vomiting 05/27/2015  . Acute respiratory failure with hypoxia ([place]) 05/27/2015  . DKA (diabetic ketoacidoses) ([place]) 05/26/2015     Past Surgical History:  Procedure Laterality Date  . EYE SURGERY     For retinopathy         Home Medications    Prior to Admission medications   Medication Sig Start Date End Date Taking? Authorizing Provider  blood glucose meter kit and supplies KIT Dispense based on patient and insurance preference. Use up to four times daily as directed. (FOR ICD-9 250.00, 250.01). 11/23/17  Yes Kayleen Memos, DO  folic acid (FOLVITE) 1 MG tablet Take 1 tablet (1 mg total) by mouth daily. 11/23/17  Yes Hall, Carole N, DO  glucagon (GLUCAGON EMERGENCY) 1 MG injection Inject 1 mg into the muscle once as needed. Patient taking differently: Inject 1 mg into the muscle daily as needed (low blood sugar).  06/20/16  Yes Philemon Kingdom, MD  insulin glargine (LANTUS) 100 UNIT/ML injection Inject 0.5 mLs (50 Units total) into the skin daily. 11/23/17  Yes Hall, Carole N, DO  insulin lispro (HUMALOG) 100 UNIT/ML injection Inject 0-9 Units into the skin 3 (three) times daily before meals.   Yes [provider]  Lancets (ACCU-CHEK SOFT TOUCH) lancets Use as directed. 11/23/17  Yes Hall, Carole N, DO  lisinopril (PRINIVIL,ZESTRIL) 20 MG tablet Take 1 tablet (20 mg total) by mouth daily. 06/20/16  Yes Philemon Kingdom, MD  Multiple Vitamin (MULTIVITAMIN WITH MINERALS) TABS tablet Take 1 tablet by mouth daily. 11/23/17  Yes Hall, Carole N, DO  pantoprazole (PROTONIX) 40 MG tablet Take 1 tablet (40 mg total)  by mouth daily. 11/23/17  Yes Irene Pap N, DO  thiamine 100 MG tablet Take 1 tablet (100 mg total) by mouth daily. 11/23/17  Yes Hall, Carole N, DO  insulin aspart (NOVOLOG) 100 UNIT/ML injection Inject 8 Units into the skin 3 (three) times daily with meals. Patient not taking: Reported on 11/26/2017 11/23/17   Kayleen Memos, DO  insulin aspart (NOVOLOG) 100 UNIT/ML injection Inject 0-9 Units into the skin 3 (three) times daily with meals. Patient not taking: Reported on 11/26/2017 11/23/17   Kayleen Memos, DO     Family History Family History  Problem Relation Age of Onset  . Diabetes Mother   . Heart disease Mother   . Diabetes Father   . Heart disease Father     Social History Social History   Tobacco Use  . Smoking status: Never Smoker  . Smokeless tobacco: Never Used  Substance Use Topics  . Alcohol use: Yes  . Drug use: No     Allergies   Novolog [insulin aspart]   Review of Systems Review of Systems  Cardiovascular: Positive for chest pain.  Gastrointestinal: Positive for vomiting.  All other systems reviewed and are negative.    Physical Exam Updated Vital Signs BP 131/86   Pulse (!) 107   Temp 98.4 F (36.9 C) (Oral)   Resp (!) 8   Ht '5\' 11"'$  (1.803 m)   Wt 97.5 kg   SpO2 100%   BMI 29.99 kg/m   Physical Exam  Constitutional: He is oriented to person, place, and time. He appears well-developed and well-nourished.  HENT:  Head: Normocephalic and atraumatic.  Cardiovascular: Regular rhythm.  No murmur heard. tachycardic  Pulmonary/Chest: Effort normal and breath sounds normal. No respiratory distress.  Abdominal: Soft. There is no rebound and no guarding.  Mild epigastric tenderness  Musculoskeletal: He exhibits no edema or tenderness.  Neurological: He is alert and oriented to person, place, and time.  Skin: Skin is warm and dry.  Psychiatric: He has a normal mood and affect. His behavior is normal.  Nursing note and vitals reviewed.    ED Treatments / Results  Labs (all labs ordered are listed, but only abnormal results are displayed) Labs Reviewed  BASIC METABOLIC PANEL - Abnormal; Notable for the following components:      Result Value   Chloride 97 (*)    CO2 13 (*)    Glucose, Bld 344 (*)    Anion gap 26 (*)    All other components within normal limits  CBC - Abnormal; Notable for the following components:   WBC 17.6 (*)    Platelets 426 (*)    All other components within normal limits  URINALYSIS, ROUTINE W REFLEX MICROSCOPIC -  Abnormal; Notable for the following components:   Color, Urine STRAW (*)    Glucose, UA >=500 (*)    Ketones, ur 80 (*)    All other components within normal limits  BLOOD GAS, VENOUS - Abnormal; Notable for the following components:   pCO2, Ven 29.0 (*)    Bicarbonate 14.1 (*)    Acid-base deficit 10.4 (*)    All other components within normal limits  CBG MONITORING, ED - Abnormal; Notable for the following components:   Glucose-Capillary 349 (*)    All other components within normal limits  I-STAT CHEM 8, ED - Abnormal; Notable for the following components:   Sodium 134 (*)    Chloride 97 (*)    Glucose, Bld 347 (*)  Calcium, Ion 1.14 (*)    TCO2 14 (*)    Hemoglobin 17.3 (*)    All other components within normal limits  MAGNESIUM  PHOSPHORUS  HEPATIC FUNCTION PANEL  I-STAT TROPONIN, ED    EKG EKG Interpretation  Date/Time:  Sunday November 26 2017 13:56:17 EDT Ventricular Rate:  125 PR Interval:    QRS Duration: 90 QT Interval:  320 QTC Calculation: 462 R Axis:   81 Text Interpretation:  Sinus tachycardia Confirmed by Quintella Reichert [phone]) on 11/26/2017 2:01:31 PM   Radiology No results found.  Procedures Procedures (including critical care time) CRITICAL CARE Performed by: Quintella Reichert   Total critical care time: 35 minutes  Critical care time was exclusive of separately billable procedures and treating other patients.  Critical care was necessary to treat or prevent imminent or life-threatening deterioration.  Critical care was time spent personally by me on the following activities: development of treatment plan with patient and/or surrogate as well as nursing, discussions with consultants, evaluation of patient's response to treatment, examination of patient, obtaining history from patient or surrogate, ordering and performing treatments and interventions, ordering and review of laboratory studies, ordering and review of radiographic studies, pulse  oximetry and re-evaluation of patient's condition.  Medications Ordered in ED Medications  sodium chloride 0.9 % bolus 1,000 mL (1,000 mLs Intravenous New Bag/Given 11/26/17 1614)  dextrose 5 %-0.45 % sodium chloride infusion (has no administration in time range)  insulin regular (NOVOLIN R,HUMULIN R) 100 Units in sodium chloride 0.9 % 100 mL (1 Units/mL) infusion (has no administration in time range)  0.9 %  sodium chloride infusion (has no administration in time range)  sodium chloride 0.9 % bolus 1,000 mL (1,000 mLs Intravenous New Bag/Given 11/26/17 1446)  ondansetron (ZOFRAN) injection 4 mg (4 mg Intravenous Given 11/26/17 1446)  famotidine (PEPCID) IVPB 20 mg premix (0 mg Intravenous Stopped 11/26/17 1452)  gi cocktail (Maalox,Lidocaine,Donnatal) (30 mLs Oral Given 11/26/17 1446)     Initial Impression / Assessment and Plan / ED Course  I have reviewed the triage vital signs and the nursing notes.  Pertinent labs & imaging results that were available during my care of the patient were reviewed by me and considered in my medical decision making (see chart for details).     Patient with T1DM here for evaluation of nausea and vomiting similar to prior episodes of DKA. He is mildly dehydrated appearing on examination and labs are consistent with recurrent DKA. He was treated with IV fluids as well as insulin drip. Patient updated of findings of studies recommendation for admission and he is in agreement with treatment plan. Hospitalist consulted for admission.  Final Clinical Impressions(s) / ED Diagnoses   Final diagnoses:  None    ED Discharge Orders    None       Quintella Reichert, MD 11/26/17 [phone]

## 2017-11-26 NOTE — ED Notes (Signed)
ED TO INPATIENT HANDOFF REPORT  Name/Age/Gender Barry Pittman 32 y.o. male  Code Status Code Status History    Date Active Date Inactive Code Status Order ID Comments User Context   11/18/2017 1807 11/23/2017 1910 Full Code [id number]  Cristal Deer, MD ED   09/22/2016 0010 09/27/2016 1942 Full Code [id number]  Rise Patience, MD ED   05/26/2015 1213 05/31/2015 1859 Full Code [id number]  Donita Brooks, NP ED      Home/SNF/Other Home    Chief Complaint diabetic dka  Level of Care/Admitting Diagnosis ED Disposition    ED Disposition Condition [facility] Area: [facility] [[id number]]  Level of Care: Stepdown [14]  Admit to SDU based on following criteria: Severe physiological/psychological symptoms:  Any diagnosis requiring assessment & intervention at least every 4 hours on an ongoing basis to obtain desired patient outcomes including stability and rehabilitation  Diagnosis: DKA (diabetic ketoacidoses) [facility]) [[id number]]  Admitting Physician: Reubin Milan [[id number]]  Attending Physician: Reubin Milan [[id number]]  Estimated length of stay: past midnight tomorrow  Certification:: I certify this patient is being admitted for an inpatient-only procedure  PT Class (Do Not Modify): Inpatient [101]  PT Acc Code (Do Not Modify): Private [1]       Medical History Past Medical History:  Diagnosis Date  . Diabetes mellitus without complication ([place])   . Hypertension     Allergies Allergies  Allergen Reactions  . Novolog [Insulin Aspart] Other (See Comments)    "goes into DKA"    IV Location/Drains/Wounds Patient Lines/Drains/Airways Status   Active Line/Drains/Airways    Name:   Placement date:   Placement time:   Site:   Days:   Peripheral IV 11/26/17 Right Hand   11/26/17    1416    Hand   less than 1   Wound / Incision (Open or Dehisced) 05/26/15 Other (Comment) Leg Left;Posterior reddened area size of quarter, and hot to  touch.   05/26/15    1500    Leg   915          Labs/Imaging Results for orders placed or performed during the hospital encounter of 11/26/17 (from the past 48 hour(s))  Urinalysis, Routine w reflex microscopic     Status: Abnormal   Collection Time: 11/26/17  1:49 PM  Result Value Ref Range   Color, Urine STRAW (A) YELLOW   APPearance CLEAR CLEAR   Specific Gravity, Urine 1.027 1.005 - 1.030   pH 6.0 5.0 - 8.0   Glucose, UA >=500 (A) NEGATIVE mg/dL   Hgb urine dipstick NEGATIVE NEGATIVE   Bilirubin Urine NEGATIVE NEGATIVE   Ketones, ur 80 (A) NEGATIVE mg/dL   Protein, ur NEGATIVE NEGATIVE mg/dL   Nitrite NEGATIVE NEGATIVE   Leukocytes, UA NEGATIVE NEGATIVE   WBC, UA 0-5 0 - 5 WBC/hpf   Bacteria, UA NONE SEEN NONE SEEN    Comment: Performed at [facility] [address] [place], [place] [address]  CBG monitoring, ED     Status: Abnormal   Collection Time: 11/26/17  1:53 PM  Result Value Ref Range   Glucose-Capillary 349 (H) 70 - 99 mg/dL  Blood gas, venous     Status: Abnormal   Collection Time: 11/26/17  2:35 PM  Result Value Ref Range   pH, Ven 7.309 7.250 - 7.430   pCO2, Ven 29.0 (L) 44.0 - 60.0 mmHg   pO2, Ven BELOW REPORTABLE RANGE 32.0 - 45.0 mmHg  Comment: CRITICAL RESULT CALLED TO, READ BACK BY AND VERIFIED WITH: E.REES, MD AT 1440 BY M.JESTER, RRT, RCP ON 11/26/2017    Bicarbonate 14.1 (L) 20.0 - 28.0 mmol/L   Acid-base deficit 10.4 (H) 0.0 - 2.0 mmol/L   O2 Saturation 45.0 %   Patient temperature 98.6    Collection site VENOUS    Drawn by DRAWN BY RN    Sample type VENOUS     Comment: Performed at [facility], [place] [address] [place], [place] [address]  Basic metabolic panel     Status: Abnormal   Collection Time: 11/26/17  2:42 PM  Result Value Ref Range   Sodium 136 135 - 145 mmol/L    Comment: REPEATED TO VERIFY   Potassium 3.8 3.5 - 5.1 mmol/L   Chloride 97 (L) 98 - 111 mmol/L    Comment: REPEATED TO VERIFY    CO2 13 (L) 22 - 32 mmol/L    Comment: REPEATED TO VERIFY   Glucose, Bld 344 (H) 70 - 99 mg/dL   BUN 13 6 - 20 mg/dL   Creatinine, Ser 1.07 0.61 - 1.24 mg/dL   Calcium 9.5 8.9 - 10.3 mg/dL   GFR calc non Af Amer >60 >60 mL/min   GFR calc Af Amer >60 >60 mL/min    Comment: (NOTE) The eGFR has been calculated using the CKD EPI equation. This calculation has not been validated in all clinical situations. eGFR's persistently <60 mL/min signify possible Chronic Kidney Disease.    Anion gap 26 (H) 5 - 15    Comment: REPEATED TO VERIFY Performed at [facility], [place] [address], [place], [place] [address]   CBC     Status: Abnormal   Collection Time: 11/26/17  2:42 PM  Result Value Ref Range   WBC 17.6 (H) 4.0 - 10.5 K/uL   RBC 5.77 4.22 - 5.81 MIL/uL   Hemoglobin 16.9 13.0 - 17.0 g/dL   HCT 48.6 39.0 - 52.0 %   MCV 84.2 78.0 - 100.0 fL   MCH 29.3 26.0 - 34.0 pg   MCHC 34.8 30.0 - 36.0 g/dL   RDW 12.5 11.5 - 15.5 %   Platelets 426 (H) 150 - 400 K/uL    Comment: Performed at [facility], [place] [address] [place], [place] [address]  I-stat troponin, ED     Status: None   Collection Time: 11/26/17  2:49 PM  Result Value Ref Range   Troponin i, poc 0.00 0.00 - 0.08 ng/mL   Comment 3            Comment: Due to the release kinetics of cTnI, a negative result within the first hours of the onset of symptoms does not rule out myocardial infarction with certainty. If myocardial infarction is still suspected, repeat the test at appropriate intervals.   I-stat Chem 8, ED     Status: Abnormal   Collection Time: 11/26/17  2:51 PM  Result Value Ref Range   Sodium 134 (L) 135 - 145 mmol/L   Potassium 3.7 3.5 - 5.1 mmol/L   Chloride 97 (L) 98 - 111 mmol/L   BUN 12 6 - 20 mg/dL   Creatinine, Ser 0.70 0.61 - 1.24 mg/dL   Glucose, Bld 347 (H) 70 - 99 mg/dL   Calcium, Ion 1.14 (L) 1.15 - 1.40 mmol/L   TCO2 14 (L) 22 - 32 mmol/L   Hemoglobin 17.3 (H)  13.0 - 17.0 g/dL   HCT 51.0 39.0 -  52.0 %   No results found.  Pending Labs Unresulted Labs (From admission, onward)    Start     Ordered   11/26/17 1538  Phosphorus  Add-on,   R     11/26/17 1537   11/26/17 1538  Hepatic function panel  Add-on,   R     11/26/17 1537   11/26/17 1537  Magnesium  Add-on,   R     11/26/17 1537   Signed and Held  Basic metabolic panel  STAT Now then every 4 hours ,   STAT     Signed and Held   Signed and Held  CBC  Tomorrow morning,   R     Signed and Held   Signed and Held  Comprehensive metabolic panel  Tomorrow morning,   R     Signed and Held          Vitals/Pain Today's Vitals   11/26/17 1530 11/26/17 1545 11/26/17 1600 11/26/17 1610  BP: 128/86   131/86  Pulse: (!) 112 (!) 114 (!) 110 (!) 107  Resp: '18 13 14   '$ Temp:      TempSrc:      SpO2: 100% 100% 100% 100%  Weight:      Height:      PainSc:        Isolation Precautions No active isolations  Medications Medications  sodium chloride 0.9 % bolus 1,000 mL (1,000 mLs Intravenous New Bag/Given 11/26/17 1614)  dextrose 5 %-0.45 % sodium chloride infusion (has no administration in time range)  insulin regular (NOVOLIN R,HUMULIN R) 100 Units in sodium chloride 0.9 % 100 mL (1 Units/mL) infusion (has no administration in time range)  0.9 %  sodium chloride infusion (has no administration in time range)  sodium chloride 0.9 % bolus 1,000 mL (1,000 mLs Intravenous Transfusing/Transfer 11/26/17 1620)  ondansetron (ZOFRAN) injection 4 mg (4 mg Intravenous Given 11/26/17 1446)  famotidine (PEPCID) IVPB 20 mg premix (0 mg Intravenous Stopped 11/26/17 1452)  gi cocktail (Maalox,Lidocaine,Donnatal) (30 mLs Oral Given 11/26/17 1446)    Mobility walks

## 2017-11-26 NOTE — ED Notes (Signed)
Unable to obtain labs through IV.

## 2017-11-27 DIAGNOSIS — D473 Essential (hemorrhagic) thrombocythemia: Secondary | ICD-10-CM

## 2017-11-27 DIAGNOSIS — E871 Hypo-osmolality and hyponatremia: Secondary | ICD-10-CM

## 2017-11-27 DIAGNOSIS — E10319 Type 1 diabetes mellitus with unspecified diabetic retinopathy without macular edema: Secondary | ICD-10-CM

## 2017-11-27 DIAGNOSIS — D72829 Elevated white blood cell count, unspecified: Secondary | ICD-10-CM

## 2017-11-27 DIAGNOSIS — I10 Essential (primary) hypertension: Secondary | ICD-10-CM

## 2017-11-27 LAB — COMPREHENSIVE METABOLIC PANEL
ALT: 11 U/L (ref 0–44)
AST: 12 U/L — ABNORMAL LOW (ref 15–41)
Albumin: 3 g/dL — ABNORMAL LOW (ref 3.5–5.0)
Alkaline Phosphatase: 53 U/L (ref 38–126)
Anion gap: 10 (ref 5–15)
BUN: 6 mg/dL (ref 6–20)
CALCIUM: 8.2 mg/dL — AB (ref 8.9–10.3)
CHLORIDE: 105 mmol/L (ref 98–111)
CO2: 18 mmol/L — AB (ref 22–32)
CREATININE: 0.71 mg/dL (ref 0.61–1.24)
GFR calc non Af Amer: >60 mL/min (ref >60)
Glucose, Bld: 169 mg/dL — ABNORMAL HIGH (ref 70–99)
Potassium: 3.5 mmol/L (ref 3.5–5.1)
SODIUM: 133 mmol/L — AB (ref 135–145)
Total Bilirubin: 0.8 mg/dL (ref 0.3–1.2)
Total Protein: 5.8 g/dL — ABNORMAL LOW (ref 6.5–8.1)

## 2017-11-27 LAB — BASIC METABOLIC PANEL
ANION GAP: 12 (ref 5–15)
ANION GAP: 15 (ref 5–15)
Anion gap: 10 (ref 5–15)
BUN: 9 mg/dL (ref 6–20)
BUN: 7 mg/dL (ref 6–20)
BUN: 5 mg/dL — ABNORMAL LOW (ref 6–20)
CHLORIDE: 104 mmol/L (ref 98–111)
CO2: 17 mmol/L — ABNORMAL LOW (ref 22–32)
CO2: 17 mmol/L — ABNORMAL LOW (ref 22–32)
CO2: 22 mmol/L (ref 22–32)
CREATININE: 0.66 mg/dL (ref 0.61–1.24)
Calcium: 8.3 mg/dL — ABNORMAL LOW (ref 8.9–10.3)
Calcium: 8.6 mg/dL — ABNORMAL LOW (ref 8.9–10.3)
Calcium: 8.6 mg/dL — ABNORMAL LOW (ref 8.9–10.3)
Chloride: 105 mmol/L (ref 98–111)
Chloride: 103 mmol/L (ref 98–111)
Creatinine, Ser: 0.83 mg/dL (ref 0.61–1.24)
Creatinine, Ser: 0.82 mg/dL (ref 0.61–1.24)
GFR calc Af Amer: >60 mL/min (ref >60)
GFR calc Af Amer: >60 mL/min (ref >60)
GFR calc non Af Amer: >60 mL/min (ref >60)
Glucose, Bld: 170 mg/dL — ABNORMAL HIGH (ref 70–99)
Glucose, Bld: 146 mg/dL — ABNORMAL HIGH (ref 70–99)
Glucose, Bld: 174 mg/dL — ABNORMAL HIGH (ref 70–99)
POTASSIUM: 3.5 mmol/L (ref 3.5–5.1)
POTASSIUM: 3.3 mmol/L — AB (ref 3.5–5.1)
POTASSIUM: 3.7 mmol/L (ref 3.5–5.1)
SODIUM: 134 mmol/L — AB (ref 135–145)
SODIUM: 135 mmol/L (ref 135–145)
SODIUM: 136 mmol/L (ref 135–145)

## 2017-11-27 LAB — GLUCOSE, CAPILLARY
GLUCOSE-CAPILLARY: 133 mg/dL — AB (ref 70–99)
GLUCOSE-CAPILLARY: 140 mg/dL — AB (ref 70–99)
GLUCOSE-CAPILLARY: 142 mg/dL — AB (ref 70–99)
GLUCOSE-CAPILLARY: 150 mg/dL — AB (ref 70–99)
GLUCOSE-CAPILLARY: 152 mg/dL — AB (ref 70–99)
GLUCOSE-CAPILLARY: 297 mg/dL — AB (ref 70–99)
Glucose-Capillary: 132 mg/dL — ABNORMAL HIGH (ref 70–99)
Glucose-Capillary: 132 mg/dL — ABNORMAL HIGH (ref 70–99)
Glucose-Capillary: 193 mg/dL — ABNORMAL HIGH (ref 70–99)
Glucose-Capillary: 182 mg/dL — ABNORMAL HIGH (ref 70–99)
Glucose-Capillary: 180 mg/dL — ABNORMAL HIGH (ref 70–99)
Glucose-Capillary: 157 mg/dL — ABNORMAL HIGH (ref 70–99)
Glucose-Capillary: 172 mg/dL — ABNORMAL HIGH (ref 70–99)
Glucose-Capillary: 223 mg/dL — ABNORMAL HIGH (ref 70–99)

## 2017-11-27 LAB — CBC
HCT: 43.4 % (ref 39.0–52.0)
Hemoglobin: 14.8 g/dL (ref 13.0–17.0)
MCH: 28.6 pg (ref 26.0–34.0)
MCHC: 34.1 g/dL (ref 30.0–36.0)
MCV: 83.9 fL (ref 78.0–100.0)
PLATELETS: 425 10*3/uL — AB (ref 150–400)
RBC: 5.17 MIL/uL (ref 4.22–5.81)
RDW: 12.5 % (ref 11.5–15.5)
WBC: 13.4 10*3/uL — ABNORMAL HIGH (ref 4.0–10.5)

## 2017-11-27 MED ORDER — INSULIN ASPART 100 UNIT/ML ~~LOC~~ SOLN
0.0000 [IU] | SUBCUTANEOUS | Status: DC
  Administered 2017-11-27: 8 [IU] via SUBCUTANEOUS
  Administered 2017-11-27: 3 [IU] via SUBCUTANEOUS

## 2017-11-27 MED ORDER — POTASSIUM CHLORIDE 10 MEQ/100ML IV SOLN
10.0000 meq | INTRAVENOUS | Status: AC
  Administered 2017-11-27 (×4): 10 meq via INTRAVENOUS
  Filled 2017-11-27 (×4): qty 100

## 2017-11-27 MED ORDER — INSULIN ASPART 100 UNIT/ML ~~LOC~~ SOLN
0.0000 [IU] | Freq: Every day | SUBCUTANEOUS | Status: DC
  Administered 2017-11-27: 2 [IU] via SUBCUTANEOUS

## 2017-11-27 MED ORDER — ENOXAPARIN SODIUM 40 MG/0.4ML ~~LOC~~ SOLN
40.0000 mg | SUBCUTANEOUS | Status: DC
  Administered 2017-11-27: 40 mg via SUBCUTANEOUS
  Filled 2017-11-27: qty 0.4

## 2017-11-27 MED ORDER — INSULIN ASPART 100 UNIT/ML ~~LOC~~ SOLN
0.0000 [IU] | Freq: Three times a day (TID) | SUBCUTANEOUS | Status: DC
  Administered 2017-11-28: 3 [IU] via SUBCUTANEOUS

## 2017-11-27 MED ORDER — INSULIN GLARGINE 100 UNIT/ML ~~LOC~~ SOLN
20.0000 [IU] | Freq: Once | SUBCUTANEOUS | Status: AC
  Administered 2017-11-27: 20 [IU] via SUBCUTANEOUS
  Filled 2017-11-27: qty 0.2

## 2017-11-27 MED ORDER — SODIUM CHLORIDE 0.9 % IV BOLUS
1000.0000 mL | Freq: Once | INTRAVENOUS | Status: AC
  Administered 2017-11-27: 1000 mL via INTRAVENOUS

## 2017-11-27 MED ORDER — INSULIN GLARGINE 100 UNIT/ML ~~LOC~~ SOLN
40.0000 [IU] | Freq: Every day | SUBCUTANEOUS | Status: DC
  Administered 2017-11-28: 40 [IU] via SUBCUTANEOUS
  Filled 2017-11-27: qty 0.4

## 2017-11-27 MED ORDER — PROMETHAZINE HCL 25 MG/ML IJ SOLN
12.5000 mg | Freq: Once | INTRAMUSCULAR | Status: AC
  Administered 2017-11-27: 12.5 mg via INTRAVENOUS
  Filled 2017-11-27: qty 1

## 2017-11-27 MED ORDER — DEXTROSE 5 % IV SOLN: 12.5000 mg | Freq: Once | INTRAVENOUS | Status: DC

## 2017-11-27 MED ORDER — POTASSIUM CHLORIDE 10 MEQ/100ML IV SOLN: 10.0000 meq | INTRAVENOUS | Status: DC

## 2017-11-27 MED ORDER — GI COCKTAIL ~~LOC~~
30.0000 mL | Freq: Once | ORAL | Status: AC
  Administered 2017-11-27: 30 mL via ORAL
  Filled 2017-11-27: qty 30

## 2017-11-27 MED ORDER — INSULIN GLARGINE 100 UNIT/ML ~~LOC~~ SOLN
20.0000 [IU] | Freq: Every day | SUBCUTANEOUS | Status: DC
  Administered 2017-11-27: 20 [IU] via SUBCUTANEOUS
  Filled 2017-11-27: qty 0.2

## 2017-11-27 NOTE — Progress Notes (Signed)
PROGRESS NOTE  Barry Pittman WVP:[id number] DOB: [birth year] DOA: 11/26/2017 PCP: Patient, No Pcp Per  HPI/Recap of past 70 hours: 32 year old male with medical history significant for type 1 diabetes, hypertension, recently admitted and discharged for DKA from 11/18/2017 to 11/23/2017 resented to the ER complaining of nausea, vomiting with generalized abdominal pain.  Patient also complained of burning sensation in his chest likely due to vomiting.  Patient previously had an insulin pump, but reported it missing on his last admission.  Patient was discharged home on subcutaneous insulin.  In the ED, blood glucose showed 344, with anion gap of 26, bicarb of 14.1.  Patient WBC 17.6, and was noted to be tachycardic.  Patient admitted for further management in SDU.  Today, patient reported feeling better, denies any further nausea/vomiting, abdominal pain, fever/chills, chest pain, denies any shortness of breath, cough.  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[place])   Hypertension  DKA with Hx of type 1 diabetes mellitus Uncontrolled Last A1c 12.6 On presentation, BG showed 344, with anion gap of 26 Currently BG controlled, with closed anion gap Started on insulin infusion protocol, will titrate off Switch to Lantus, SSI, hypoglycemic protocol Resume diet Continue IV fluids Monitor closely Diabetes coordinator consulted Pt reported having a new insulin pump, advised to visit his endocrinologist, for further management charge  Leukocytosis Afebrile, asymptomatic Likely reactive due to above, hemoconcentration UA negative Daily CBC  Thrombocytosis Likely due to hemoconcentration Daily CBC  Hyponatremia Resolved  Hypertension Continue home lisinopril    Code Status: Full  Family Communication: None at bedside  Disposition  Plan: Home likely 11/28/2017   Consultants:  None  Procedures:  None  Antimicrobials:  None  DVT prophylaxis: Lovenox   Objective: Vitals:   11/27/17 0900 11/27/17 0948 11/27/17 1000 11/27/17 1100  BP: (!) 154/86 (!) 154/86 (!) 155/89 (!) 149/90  Pulse: 80  78 86  Resp: 15  15 (!) 9  Temp:      TempSrc:      SpO2: 98%  100% 100%  Weight:      Height:        Intake/Output Summary (Last 24 hours) at 11/27/2017 1128 Last data filed at 11/27/2017 1115 Gross per 24 hour  Intake 3248.72 ml  Output 2050 ml  Net 1198.72 ml   Filed Weights   11/26/17 1348 11/26/17 1640  Weight: 97.5 kg 97.4 kg    Exam:   General: NAD  Cardiovascular: S1, S2 present  Respiratory: CTAB  Abdomen: Soft, nontender, nondistended, bowel sounds present  Musculoskeletal: No pedal edema bilaterally  Skin: Normal  Psychiatry: Normal mood   Data Reviewed: CBC: Recent Labs  Lab 11/26/17 1442 11/26/17 1451 11/27/17 0339  WBC 17.6*  --  13.4*  HGB 16.9 17.3* 14.8  HCT 48.6 51.0 43.4  MCV 84.2  --  83.9  PLT 426*  --  546*   Basic Metabolic Panel: Recent Labs  Lab 11/26/17 1442  11/26/17 1832 11/26/17 2255 11/27/17 0339 11/27/17 0717 11/27/17 1058  NA 136   < > 136 134* 135 133* 136  K 3.8   < > 4.5 3.5 3.3* 3.5 3.7  CL 97*   < > 107 105 103 105 104  CO2 13*  --  12* 17* 17* 18* 22  GLUCOSE 344*   < > 163* 170* 146* 169* 174*  BUN  13   < > 11 9 7 6  5*  CREATININE 1.07   < > 0.85 0.83 0.82 0.71 0.66  CALCIUM 9.5  --  8.4* 8.3* 8.6* 8.2* 8.6*  MG 2.0  --   --   --   --   --   --   PHOS 3.2  --   --   --   --   --   --    < > = values in this interval not displayed.   GFR: Estimated Creatinine Clearance: 157.7 mL/min (by C-G formula based on SCr of 0.66 mg/dL). Liver Function Tests: Recent Labs  Lab 11/26/17 1442 11/27/17 0717  AST 18 12*  ALT 16 11  ALKPHOS 69 53  BILITOT 1.4* 0.8  PROT 7.8 5.8*  ALBUMIN 4.2 3.0*   No results for input(s): LIPASE,  AMYLASE in the last 168 hours. No results for input(s): AMMONIA in the last 168 hours. Coagulation Profile: No results for input(s): INR, PROTIME in the last 168 hours. Cardiac Enzymes: No results for input(s): CKTOTAL, CKMB, CKMBINDEX, TROPONINI in the last 168 hours. BNP (last 3 results) No results for input(s): PROBNP in the last 8760 hours. HbA1C: No results for input(s): HGBA1C in the last 72 hours. CBG: Recent Labs  Lab 11/27/17 0638 11/27/17 0737 11/27/17 0840 11/27/17 0940 11/27/17 1048  GLUCAP 180* 140* 152* 133* 157*   Lipid Profile: No results for input(s): CHOL, HDL, LDLCALC, TRIG, CHOLHDL, LDLDIRECT in the last 72 hours. Thyroid Function Tests: No results for input(s): TSH, T4TOTAL, FREET4, T3FREE, THYROIDAB in the last 72 hours. Anemia Panel: No results for input(s): VITAMINB12, FOLATE, FERRITIN, TIBC, IRON, RETICCTPCT in the last 72 hours. Urine analysis:    Component Value Date/Time   COLORURINE STRAW (A) 11/26/2017 1349   APPEARANCEUR CLEAR 11/26/2017 1349   LABSPEC 1.027 11/26/2017 1349   PHURINE 6.0 11/26/2017 1349   GLUCOSEU >=500 (A) 11/26/2017 1349   HGBUR NEGATIVE 11/26/2017 1349   BILIRUBINUR NEGATIVE 11/26/2017 1349   KETONESUR 80 (A) 11/26/2017 1349   PROTEINUR NEGATIVE 11/26/2017 1349   NITRITE NEGATIVE 11/26/2017 1349   LEUKOCYTESUR NEGATIVE 11/26/2017 1349   Sepsis Labs: @LABRCNTIP (procalcitonin:4,lacticidven:4)  ) Recent Results (from the past 240 hour(s))  Culture, blood (routine x 2)     Status: None   Collection Time: 11/18/17  6:02 PM  Result Value Ref Range Status   Specimen Description   Final    BLOOD LEFT WRIST Performed at [facility] [address], [place], [place] [address]    Special Requests   Final    BOTTLES DRAWN AEROBIC AND ANAEROBIC Blood Culture adequate volume Performed at [facility] [address], [place], [place] [address]    Culture   Final    NO GROWTH 5  DAYS Performed at [facility], [place] [address] [place], [place] [address]    Report Status 11/23/2017 FINAL  Final  Culture, blood (routine x 2)     Status: None   Collection Time: 11/19/17  6:28 AM  Result Value Ref Range Status   Specimen Description   Final    BLOOD RIGHT ARM Performed at [facility] [address], [place], [place] [address]    Special Requests   Final    BOTTLES DRAWN AEROBIC ONLY Blood Culture results may not be optimal due to an inadequate volume of blood received in culture bottles Performed at [facility] [address] [place], [place]  [address]    Culture   Final    NO GROWTH 5 DAYS Performed at [facility], [place] [address], [place], [place] [address]    Report Status 11/24/2017 FINAL  Final  MRSA PCR Screening     Status: None   Collection Time: 11/19/17  2:50 PM  Result Value Ref Range Status   MRSA by PCR NEGATIVE NEGATIVE Final    Comment:        The GeneXpert MRSA Assay (FDA approved for NASAL specimens only), is one component of a comprehensive MRSA colonization surveillance program. It is not intended to diagnose MRSA infection nor to guide or monitor treatment for MRSA infections. Performed at [facility], [place] [address], [place], [place] [address]       Studies: No results found.  Scheduled Meds: . insulin aspart  0-15 Units Subcutaneous Q4H  . insulin glargine  20 Units Subcutaneous Daily  . lisinopril  20 mg Oral Daily  . multivitamin with minerals  1 tablet Oral Daily  . pantoprazole  40 mg Oral Daily  . thiamine  100 mg Oral Daily    Continuous Infusions: . sodium chloride Stopped (11/26/17 1901)  . dextrose 5 % and 0.45% NaCl 1,000 mL (11/27/17 0845)  . insulin (NOVOLIN-R) infusion 1.9 Units/hr (11/27/17 1102)     LOS: 1 day     [place] Friendly, MD [facility]   If 7PM-7AM, please contact  night-coverage [email or web address] Password [facility] 11/27/2017, 11:28 AM

## 2017-11-27 NOTE — Progress Notes (Signed)
Inpatient Diabetes Program Recommendations  AACE/ADA: New Consensus Statement on Inpatient Glycemic Control (2015)  Target Ranges:  Prepandial:   less than 140 mg/dL      Peak postprandial:   less than 180 mg/dL (1-2 hours)      Critically ill patients:  140 - 180 mg/dL   Lab Results  Component Value Date   GLUCAP 172 (H) 11/27/2017   HGBA1C 12.6 (H) 11/19/2017    Review of Glycemic Control  Diabetes history: DM1 Outpatient Diabetes medications: Lantus 50 units QD, Humalog 0-9 units tidwc Current orders for Inpatient glycemic control: Transitioning off insulin drip. Lantus 20 units QD, Novolog 0-15 units Q4H.  Spoke with pt regarding admission. Pt states he was checking blood sugars and taking insulin as prescribed. Started feeling weak, nauseated and blood sugars were in 300s. Felt like he was in DKA.  Pt states he is under a lot of stress at home.  Insulin pump sent for replacement to pt's home, but pt has not gotten it set up at this time. Needs to make appt with endo and CDE to get pump back on.  Inpatient Diabetes Program Recommendations:     Increase Lantus to 40 units QD. Add Novolog 8 units tidwc for meal coverage insulin if pt eats > 50% meal. Change Novolog to 0-9 units tidwc and hs when po intake improves (Type 1 and sensitive to insulin)  Will follow closely.  Thank you. Lorenda Peck, RD, LDN, CDE Inpatient Diabetes Coordinator [phone]

## 2017-11-27 NOTE — Care Management Note (Signed)
Case Management Note  Patient Details  Name: Barry Pittman MRN: [id number] Date of Birth: 1986-04-18  Subjective/Objective:         dka with constant nausea and vomiting was taking long acting insulin but could not keep any foods or liquids down/glucose over 500 on admit/started on iv insulin/iv d51/2ns at 125cc/hr/           Action/Plan:  Will need diabetic resources/will follow for cm needs   Expected Discharge Date:  (unknown)               Expected Discharge Plan:  Home/Self Care  In-House Referral:     Discharge planning Services  CM Consult  Post Acute Care Choice:    Choice offered to:     DME Arranged:    DME Agency:     HH Arranged:    HH Agency:     Status of Service:  In process, will continue to follow  If discussed at Long Length of Stay Meetings, dates discussed:    Additional Comments:  Leeroy Cha, RN 11/27/2017, 10:34 AM

## 2017-11-28 DIAGNOSIS — K219 Gastro-esophageal reflux disease without esophagitis: Secondary | ICD-10-CM

## 2017-11-28 LAB — BASIC METABOLIC PANEL
ANION GAP: 10 (ref 5–15)
CHLORIDE: 103 mmol/L (ref 98–111)
CO2: 24 mmol/L (ref 22–32)
Calcium: 8.3 mg/dL — ABNORMAL LOW (ref 8.9–10.3)
Creatinine, Ser: 0.61 mg/dL (ref 0.61–1.24)
GFR calc Af Amer: >60 mL/min (ref >60)
GFR calc non Af Amer: >60 mL/min (ref >60)
GLUCOSE: 118 mg/dL — AB (ref 70–99)
POTASSIUM: 3.1 mmol/L — AB (ref 3.5–5.1)
Sodium: 137 mmol/L (ref 135–145)

## 2017-11-28 LAB — CBC WITH DIFFERENTIAL/PLATELET
BASOS ABS: 0 10*3/uL (ref 0.0–0.1)
Basophils Relative: 0 %
EOS PCT: 2 %
Eosinophils Absolute: 0.1 10*3/uL (ref 0.0–0.7)
HEMATOCRIT: 40.7 % (ref 39.0–52.0)
Hemoglobin: 13.9 g/dL (ref 13.0–17.0)
LYMPHS ABS: 3.7 10*3/uL (ref 0.7–4.0)
LYMPHS PCT: 46 %
MCH: 28.4 pg (ref 26.0–34.0)
MCHC: 34.2 g/dL (ref 30.0–36.0)
MCV: 83.1 fL (ref 78.0–100.0)
MONO ABS: 0.9 10*3/uL (ref 0.1–1.0)
MONOS PCT: 12 %
NEUTROS ABS: 3.1 10*3/uL (ref 1.7–7.7)
Neutrophils Relative %: 40 %
PLATELETS: 370 10*3/uL (ref 150–400)
RBC: 4.9 MIL/uL (ref 4.22–5.81)
RDW: 12.3 % (ref 11.5–15.5)
WBC: 7.8 10*3/uL (ref 4.0–10.5)

## 2017-11-28 LAB — GLUCOSE, CAPILLARY
Glucose-Capillary: 85 mg/dL (ref 70–99)
Glucose-Capillary: 170 mg/dL — ABNORMAL HIGH (ref 70–99)

## 2017-11-28 MED ORDER — ALUMINUM-MAGNESIUM-SIMETHICONE 200-200-20 MG/5ML PO SUSP: 30.0000 mL | Freq: Three times a day (TID) | ORAL | 0 refills | Status: DC | PRN

## 2017-11-28 MED ORDER — POTASSIUM CHLORIDE CRYS ER 20 MEQ PO TBCR
40.0000 meq | EXTENDED_RELEASE_TABLET | Freq: Once | ORAL | Status: AC
  Administered 2017-11-28: 40 meq via ORAL
  Filled 2017-11-28: qty 2

## 2017-11-28 MED ORDER — PANTOPRAZOLE SODIUM 40 MG PO TBEC: 40.0000 mg | DELAYED_RELEASE_TABLET | Freq: Two times a day (BID) | ORAL | 0 refills | Status: DC

## 2017-11-28 NOTE — Care Management Note (Signed)
Case Management Note  Patient Details  Name: TAI SKELLY MRN: [id number] Date of Birth: [birth year]  Subjective/Objective:                  Discharge to home  Action/Plan: Was seen by the diabetic coordinator for resources and insulin needs/is able to afford needs and care/no other dc needs present.  Expected Discharge Date:  11/28/17               Expected Discharge Plan:  Home/Self Care  In-House Referral:     Discharge planning Services  CM Consult  Post Acute Care Choice:    Choice offered to:     DME Arranged:    DME Agency:     HH Arranged:    HH Agency:     Status of Service:  Completed, signed off  If discussed at [employer] of Stay Meetings, dates discussed:    Additional Comments:  Leeroy Cha, RN 11/28/2017, 10:54 AM

## 2017-11-28 NOTE — Discharge Summary (Signed)
Discharge Summary  Barry Pittman [id number] DOB: Dec 27, 1985  PCP: Patient, No Pcp Per  Admit date: 11/26/2017 Discharge date: 11/28/2017  Time spent: 40 mins  Recommendations for Outpatient Follow-up:  1. Advised pt to establish care with PCP 2. Encouraged pt to make an appointment with his endocrinologist to set up his new insulin pump  Discharge Diagnoses:  Active Hospital Problems   Diagnosis Date Noted  . DKA (diabetic ketoacidoses) ([place]) 05/26/2015  . Hypertension 11/26/2017  . 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 Hyponatremia 05/27/2015  . Thrombocytosis ([place]) 05/27/2015  . Leukocytosis 05/27/2015    Resolved Hospital Problems  No resolved problems to display.    Discharge Condition: Stable   Diet recommendation: Modified carb  Vitals:   11/28/17 0900 11/28/17 0925  BP: (!) 177/106 (!) 149/86  Pulse: 89   Resp: 12   Temp:    SpO2: 100%     History of present illness:  32 year old male with medical history significant for type 1 diabetes, hypertension, recently admitted and discharged for DKA from 11/18/2017 to 11/23/2017 resented to the ER complaining of nausea, vomiting with generalized abdominal pain.  Patient also complained of burning sensation in his chest likely due to vomiting.  Patient previously had an insulin pump, but reported it missing on his last admission.  Patient was discharged home on subcutaneous insulin.  In the ED, blood glucose showed 344, with anion gap of 26, bicarb of 14.1.  Patient WBC 17.6, and was noted to be tachycardic.  Patient admitted for further management in SDU.  Today, patient reported feeling much better, although c/o burning sensation overnight requiring GI cocktail which resolved the discomfort. Pt denies any further nausea/vomiting, abdominal pain, fever/chills, chest pain, denies any shortness of breath, cough.  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[place])   Hypertension  DKA with Hx of type 1 diabetes mellitus Uncontrolled Last A1c 12.6 On presentation, BG showed 344, with anion gap of 26 Currently BG controlled, with closed anion gap S/P insulin infusion protocol Continue home Lantus, SSI regimen till appointment with his endocrinologist to set up his insulin pump Diabetes coordinator consulted  Leukocytosis Resolved  Afebrile, asymptomatic Likely reactive due to above, hemoconcentration UA negative Daily CBC  Thrombocytosis Resolved Likely due to hemoconcentration Daily CBC  Hyponatremia Resolved  Hypertension Continue home lisinopril  GERD Pt c/o significant burning sensation chest discomfort usually associated with meals Will increase home pantoprazole to 40 mg BID Advised to establish care with PCP      Procedures:  None   Consultations:  None  Discharge Exam: BP (!) 149/86   Pulse 89   Temp 98.1 F (36.7 C) (Oral)   Resp 12   Ht '5\' 11"'$  (1.803 m)   Wt 97.4 kg   SpO2 100%   BMI 29.95 kg/m   General: NAD Cardiovascular: S1, S2 present Respiratory: CTAB   Discharge Instructions You were cared for by a hospitalist during your hospital stay. If you have any questions about your discharge medications or the care you received while you were in the hospital after you are discharged, you can call the unit and asked to speak with the hospitalist on call if the hospitalist that took care of you is not available. Once you are discharged, your primary care physician will handle any further medical issues. Please  note that NO REFILLS for any discharge medications will be authorized once you are discharged, as it is imperative that you return to your primary care physician (or establish a relationship with a primary care physician if you do not have one) for your aftercare needs so  that they can reassess your need for medications and monitor your lab values.   Allergies as of 11/28/2017      Reactions   Novolog [insulin Aspart] Other (See Comments)   "goes into DKA"      Medication List    STOP taking these medications   insulin aspart 100 UNIT/ML injection Commonly known as:  novoLOG     TAKE these medications   accu-chek soft touch lancets Use as directed.   aluminum-magnesium hydroxide-simethicone 315-400-86 MG/5ML Susp Commonly known as:  MAALOX Take 30 mLs by mouth 3 (three) times daily between meals as needed.   blood glucose meter kit and supplies Kit Dispense based on patient and insurance preference. Use up to four times daily as directed. (FOR ICD-9 250.00, 250.01).   folic acid 1 MG tablet Commonly known as:  FOLVITE Take 1 tablet (1 mg total) by mouth daily.   glucagon 1 MG injection Inject 1 mg into the muscle once as needed. What changed:    when to take this  reasons to take this   insulin glargine 100 UNIT/ML injection Commonly known as:  LANTUS Inject 0.5 mLs (50 Units total) into the skin daily.   insulin lispro 100 UNIT/ML injection Commonly known as:  HUMALOG Inject 0-9 Units into the skin 3 (three) times daily before meals.   lisinopril 20 MG tablet Commonly known as:  PRINIVIL,ZESTRIL Take 1 tablet (20 mg total) by mouth daily.   multivitamin with minerals Tabs tablet Take 1 tablet by mouth daily.   pantoprazole 40 MG tablet Commonly known as:  PROTONIX Take 1 tablet (40 mg total) by mouth 2 (two) times daily before a meal. What changed:  when to take this   thiamine 100 MG tablet Take 1 tablet (100 mg total) by mouth daily.      Allergies  Allergen Reactions  . Novolog [Insulin Aspart] Other (See Comments)    "goes into DKA"   Follow-up Information    [facility]. Schedule an appointment as soon as possible for a visit in 1 week(s).   Why:  call to establish care if you  dont have insurance. If you do have insurance, you have to find your own PCP  Contact information: [address] [place] [address] [phone]           The results of significant diagnostics from this hospitalization (including imaging, microbiology, ancillary and laboratory) are listed below for reference.    Significant Diagnostic Studies: No results found.  Microbiology: Recent Results (from the past 240 hour(s))  Culture, blood (routine x 2)     Status: None   Collection Time: 11/18/17  6:02 PM  Result Value Ref Range Status   Specimen Description   Final    BLOOD LEFT WRIST Performed at [facility] [address] [place], [place] [address]    Special Requests   Final    BOTTLES DRAWN AEROBIC AND ANAEROBIC Blood Culture adequate volume Performed at [facility] [address] [place], [place] [address]    Culture   Final    NO GROWTH 5 DAYS Performed at [facility], [place]  [address] [place], [place] [address]    Report Status 11/23/2017 FINAL  Final  Culture, blood (routine x 2)     Status: None   Collection Time: 11/19/17  6:28 AM  Result Value Ref Range Status   Specimen Description   Final    BLOOD RIGHT ARM Performed at [facility] [address] [place], [place] [address]    Special Requests   Final    BOTTLES DRAWN AEROBIC ONLY Blood Culture results may not be optimal due to an inadequate volume of blood received in culture bottles Performed at [facility] [address] [place], [place] [address]    Culture   Final    NO GROWTH 5 DAYS Performed at [facility], [place] [address] [place], [place] [address]    Report Status 11/24/2017 FINAL  Final  MRSA PCR Screening     Status: None   Collection Time: 11/19/17  2:50 PM  Result Value Ref Range Status   MRSA by PCR NEGATIVE NEGATIVE Final    Comment:        The GeneXpert MRSA Assay  (FDA approved for NASAL specimens only), is one component of a comprehensive MRSA colonization surveillance program. It is not intended to diagnose MRSA infection nor to guide or monitor treatment for MRSA infections. Performed at [facility], [place] [address], [place],  [address]      Labs: Basic Metabolic Panel: Recent Labs  Lab 11/26/17 1442  11/26/17 2255 11/27/17 0339 11/27/17 0717 11/27/17 1058 11/28/17 0308  NA 136   < > 134* 135 133* 136 137  K 3.8   < > 3.5 3.3* 3.5 3.7 3.1*  CL 97*   < > 105 103 105 104 103  CO2 13*   < > 17* 17* 18* 22 24  GLUCOSE 344*   < > 170* 146* 169* 174* 118*  BUN 13   < > '9 7 6 '$ 5* <5*  CREATININE 1.07   < > 0.83 0.82 0.71 0.66 0.61  CALCIUM 9.5   < > 8.3* 8.6* 8.2* 8.6* 8.3*  MG 2.0  --   --   --   --   --   --   PHOS 3.2  --   --   --   --   --   --    < > = values in this interval not displayed.   Liver Function Tests: Recent Labs  Lab 11/26/17 1442 11/27/17 0717  AST 18 12*  ALT 16 11  ALKPHOS 69 53  BILITOT 1.4* 0.8  PROT 7.8 5.8*  ALBUMIN 4.2 3.0*   No results for input(s): LIPASE, AMYLASE in the last 168 hours. No results for input(s): AMMONIA in the last 168 hours. CBC: Recent Labs  Lab 11/26/17 1442 11/26/17 1451 11/27/17 0339 11/28/17 0308  WBC 17.6*  --  13.4* 7.8  NEUTROABS  --   --   --  3.1  HGB 16.9 17.3* 14.8 13.9  HCT 48.6 51.0 43.4 40.7  MCV 84.2  --  83.9 83.1  PLT 426*  --  425* 370   Cardiac Enzymes: No results for input(s): CKTOTAL, CKMB, CKMBINDEX, TROPONINI in the last 168 hours. BNP: BNP (last 3 results) No results for input(s): BNP in the last 8760 hours.  ProBNP (last 3 results) No results for input(s): PROBNP in the last 8760 hours.  CBG: Recent Labs  Lab 11/27/17 1048 11/27/17 1150 11/27/17 1634 11/27/17 2002 11/28/17 0800  [place]       Signed:  [place] Friendly, MD [facility] 11/28/2017, 10:44 AM

## 2017-11-28 NOTE — Progress Notes (Signed)
Discharge information provided to patient. Opportunity for questions provided, no questions asked. Patient's ride arrived. PIV removed. Patient walked down to main entrance by RN.

## 2017-12-04 ENCOUNTER — Telehealth: Payer: Self-pay

## 2017-12-04 NOTE — Telephone Encounter (Signed)
Can you please write this?  It should probably say that  he is now ready to return to work effective tomorrow.  I will see him on September 9, I see he has an appointment then.

## 2017-12-04 NOTE — Telephone Encounter (Signed)
Pt was hospitalized and would like to get a note from Dr Cruzita Lederer stating he was hospitalized, the days he was hospitalized, and that he can return to work. Please advise

## 2017-12-04 NOTE — Telephone Encounter (Signed)
Mailbox is full, letter done

## 2017-12-25 ENCOUNTER — Encounter: Payer: Self-pay | Admitting: Internal Medicine

## 2017-12-25 ENCOUNTER — Ambulatory Visit (INDEPENDENT_AMBULATORY_CARE_PROVIDER_SITE_OTHER): Payer: 59 | Admitting: Internal Medicine

## 2017-12-25 VITALS — BP 146/90 | HR 98 | Ht 71.0 in | Wt 230.2 lb

## 2017-12-25 DIAGNOSIS — Z23 Encounter for immunization: Secondary | ICD-10-CM | POA: Diagnosis not present

## 2017-12-25 DIAGNOSIS — E10319 Type 1 diabetes mellitus with unspecified diabetic retinopathy without macular edema: Secondary | ICD-10-CM | POA: Diagnosis not present

## 2017-12-25 LAB — LIPID PANEL
CHOLESTEROL: 144 mg/dL (ref 0–200)
HDL: 47.1 mg/dL (ref >39.00)
LDL CALC: 75 mg/dL (ref 0–99)
NonHDL: 96.45
TRIGLYCERIDES: 105 mg/dL (ref 0.0–149.0)
Total CHOL/HDL Ratio: 3
VLDL: 21 mg/dL (ref 0.0–40.0)

## 2017-12-25 LAB — MICROALBUMIN / CREATININE URINE RATIO
CREATININE, U: 78.9 mg/dL
Microalb Creat Ratio: 0.9 mg/g (ref 0.0–30.0)
Microalb, Ur: 0.7 mg/dL (ref 0.0–1.9)

## 2017-12-25 LAB — TSH: TSH: 0.97 u[IU]/mL (ref 0.35–4.50)

## 2017-12-25 MED ORDER — GLUCAGON (RDNA) 1 MG IJ KIT: 1.0000 mg | PACK | Freq: Every day | INTRAMUSCULAR | 3 refills | Status: DC | PRN

## 2017-12-25 NOTE — Progress Notes (Signed)
Patient ID: Barry Pittman, male   DOB: [birth year], 32 y.o.   MRN: [id number]   HPI: Barry Pittman is a 32 y.o.-year-old male, returning for follow-up forDM1, dx'ed at 32 y/o (1999), on insulin pump since 2000, uncontrolled, with  complications (DR).  Last visit with me 6 months ago.  He is not usually compliant with appointments.  He has been in DKA 2x since last visit (at least one time this was because of drinking alcohol), last 11/26/2017. His pump was lost in the hospital last mo >> was off the pump for 2 weeks. Off sensor, also.  Last hemoglobin A1c was: Lab Results  Component Value Date   HGBA1C 12.6 (H) 11/19/2017   HGBA1C 11.6 (H) 07/06/2017   HGBA1C 11.9 12/06/2016   He is on the [employer] 670 G insulin pump + integrated CGM (not wearing this consistently in the past).  He started 05/2017. Previously used a [employer] 722 since 2015.  He did not have a CGM before.  He uses Humalog in the pump.  Pump settings: - basal rates: 12 am: 1.7 4 am: 2.9 9 am: 2.2 10 pm: 2.3 - ICR:   12 am: 7 >> 5  6 pm: 6 >> 5 - target: 100-100 - ISF: 25 - Insulin on Board: 4h - bolus wizard: on TDD from basal insulin: 87% >> 64% >> 64% TDD from bolus insulin: 13% >> 36% >> 36% TDD: 90 units a day - extended bolusing: not using - changes infusion site: Every 3 to 4 days days - Meter: Freestyle Lite  CBGs: In the last 2 weeks: - am: 106-1 54, 160 - 2h after b'fast: n/c - lunch: 102-168, 206 - 2h after lunch: n/c - dinner: 97-214 - 2h after dinner: 133-197 - bedtime: see above  He checks his sugars 4.1 times a day  Lowest sugar was 44 (am - slept in) >> 45 (overcorrected high blood sugar); lowest sugar he ever had was 35 (years ago). he has hypoglycemia awareness in the 80s.  No previous hypoglycemia admission.  He does have an expired glucagon kit at home  Highest sugar was 320 - tube obstruction >> 400s.  He had 2 admissions for DKA in 11/2017.  Pt's meals  are: - Breakfast: Mac Muffin - Lunch: Subway - Dinner: fast food - Snacks: yoghurt, cheese crackers, diet Mtn Dew  -No CKD, last BUN/creatinine:  Lab Results  Component Value Date   BUN <5 (L) 11/28/2017   BUN 5 (L) 11/27/2017   CREATININE 0.61 11/28/2017   CREATININE 0.66 11/27/2017   Lab Results  Component Value Date   MICRALBCREAT 0.6 12/06/2016   On lisinopril.  - No HL; last set of lipids: Lab Results  Component Value Date   CHOL 141 12/06/2016   HDL 51.70 12/06/2016   LDLCALC 68 12/06/2016   TRIG 107.0 12/06/2016   CHOLHDL 3 12/06/2016  Not on a statin.  - last eye exam was 11/2017: reportedly no DR; had this before.  Dr. Baird Cancer.  He had retinal surgery.  -No numbness and tingling in his feet.  Last TSH was normal at last check Lab Results  Component Value Date   TSH 0.87 12/06/2016   Pt has FH of DM in M and F. Both died before 32 y/o.  He also has a history of HTN.  ROS: Constitutional: no weight gain/no weight loss, no fatigue, no subjective hyperthermia, no subjective hypothermia Eyes: no blurry vision, no xerophthalmia ENT: no sore throat, no  nodules palpated in throat, no dysphagia, no odynophagia, no hoarseness Cardiovascular: no CP/no SOB/no palpitations/no leg swelling Respiratory: no cough/no SOB/no wheezing Gastrointestinal: no N/no V/no D/no C/no acid reflux Musculoskeletal: no muscle aches/no joint aches Skin: no rashes, no hair loss Neurological: no tremors/no numbness/no tingling/no dizziness  I reviewed pt's medications, allergies, PMH, social hx, family hx, and changes were documented in the history of present illness. Otherwise, unchanged from my initial visit note.  Past Medical History:  Diagnosis Date  . Diabetes mellitus without complication ([place])   . Hypertension    Past Surgical History:  Procedure Laterality Date  . EYE SURGERY     For retinopathy    Social History   Social History  . Marital status: married     Spouse name: N/A  . Number of children: 2   Occupational History  . sales   Social History Main Topics  . Smoking status: Never Smoker  . Smokeless tobacco: Never Used  . Alcohol use Yes  . Drug use: No   Current Outpatient Medications on File Prior to Visit  Medication Sig Dispense Refill  . aluminum-magnesium hydroxide-simethicone (MAALOX) 124-580-99 MG/5ML SUSP Take 30 mLs by mouth 3 (three) times daily between meals as needed. 355 mL 0  . blood glucose meter kit and supplies KIT Dispense based on patient and insurance preference. Use up to four times daily as directed. (FOR ICD-9 250.00, 250.01). 1 each 0  . folic acid (FOLVITE) 1 MG tablet Take 1 tablet (1 mg total) by mouth daily. 30 tablet 0  . glucagon (GLUCAGON EMERGENCY) 1 MG injection Inject 1 mg into the muscle once as needed. (Patient taking differently: Inject 1 mg into the muscle daily as needed (low blood sugar). ) 1 each 12  . insulin glargine (LANTUS) 100 UNIT/ML injection Inject 0.5 mLs (50 Units total) into the skin daily. 30 mL 0  . insulin lispro (HUMALOG) 100 UNIT/ML injection Inject 0-9 Units into the skin 3 (three) times daily before meals.    . Lancets (ACCU-CHEK SOFT TOUCH) lancets Use as directed. 100 each 0  . lisinopril (PRINIVIL,ZESTRIL) 20 MG tablet Take 1 tablet (20 mg total) by mouth daily. 30 tablet 3  . Multiple Vitamin (MULTIVITAMIN WITH MINERALS) TABS tablet Take 1 tablet by mouth daily. 30 tablet 0  . pantoprazole (PROTONIX) 40 MG tablet Take 1 tablet (40 mg total) by mouth 2 (two) times daily before a meal. 60 tablet 0  . thiamine 100 MG tablet Take 1 tablet (100 mg total) by mouth daily. 30 tablet 0   No current facility-administered medications on file prior to visit.    Allergies  Allergen Reactions  . Novolog [Insulin Aspart] Other (See Comments)    "goes into DKA"     Family History  Problem Relation Age of Onset  . Diabetes Mother   . Heart disease Mother   . Diabetes Father   .  Heart disease Father    PE: BP (!) 146/90   Pulse 98   Ht _0  (1.803 m)   Wt 230 lb 3.2 oz (104.4 kg)   SpO2 98%   BMI 32.11 kg/m  Wt Readings from Last 3 Encounters:  12/25/17 230 lb 3.2 oz (104.4 kg)  11/26/17 214 lb 11.7 oz (97.4 kg)  11/21/17 214 lb 15.2 oz (97.5 kg)   Constitutional: overweight, in NAD Eyes: PERRLA, EOMI, no exophthalmos ENT: moist mucous membranes, no thyromegaly, no cervical lymphadenopathy Cardiovascular: Tachycardia RR, No MRG Respiratory: CTA B Gastrointestinal:  abdomen soft, NT, ND, BS+ Musculoskeletal: no deformities, strength intact in all 4 Skin: moist, warm, no rashes Neurological: no tremor with outstretched hands, DTR normal in all 4  ASSESSMENT: 1. DM1, uncontrolled, with complications - DR  PLAN:  1. Patient with long-standing, uncontrolled, type 2 diabetes, on insulin pump therapy.  He again returns after a longer absence.  At last visit, HbA1c were still very high, however, since then, he had an even higher HbA1c, at 12.6% at the beginning of last month.  At last visit, as he was still having hyperglycemia after meals, we decreased his insulin to carb ratios.  However, since last visit, after he lost his pump, he went back to using the previous insulin to carb ratios. - In the last 2 weeks, I see a significant improvement in his blood sugars, with the majority of them being lower than 200s.  He mentions that he started to do what he is supposed to: Entering carbs, checking sugars and bolusing with meals.  I congratulated him and strongly advised him to continue.  Per his request, we will check a fructosamine level today to see if this improved from the 12.6% obtained 1 month ago. -There are still some barriers for good control: 1. He is still sometimes bolusing after the start of the meal with subsequent postprandial hyperglycemia followed by hypoglycemia.  I again advised him to move the boluses 15 minutes before every meal. 2. He continues  to work on improving his carb counting as I feel that sometimes does not enter the right amount of carbs 3. His basal-bolus insulin ratios are still suboptimal.  Ideally 50-50%.  We will try again to decrease his insulin to carb ratios.  However, I also increase his basal rate at night slightly. 4. We also discussed about joining the type 1 diabetes support group in the nutrition center in our building.  I gave him the telephone number. 5. Strongly advised him to keep his sensor on after he attaches it.  He is determined to use this consistently from now on - He continues to work with Madaline Brilliant from [employer] - I advised him to: Patient Instructions   Please change: - basal rates: 12 am: 1.7 >> 1.9 4 am: 2.9 9 am: 2.2 10 pm: 2.3 - ICR:   12 am: 7 >> 6  6 pm: 6 >> 5 - target: 100-100  - ISF: 25 - Insulin on Board: 4h  Please check sugars and enter them in the pump, enter the carbs in the pump and bolus Humalog 10-15 min before every meal.  Tel nr. for the DM1 support group: [phone].  Please return in 3 to 4 months   - continue checking sugars at different times of the day - check at least 4x a day, rotating checks - advised for yearly eye exams >> he is UTD - I filled out his DMV paperwork at last visit - He is due for lipid panel and a TSH along with an ACR -we will check these today - Also given flu shot today - Return to clinic in 3-4 mo with sugar log    - time spent with the patient: 30 min, of which >50% was spent in reviewing his pump and CGM downloads, discussing his hypo- and hyper-glycemic episodes, reviewing previous labs and pump settings and developing a plan to avoid hypo- and hyper-glycemia.   Office Visit on 12/25/2017  Component Date Value Ref Range Status  . TSH 12/25/2017 0.97  0.35 -  4.50 uIU/mL Final  . Cholesterol 12/25/2017 144  0 - 200 mg/dL Final   ATP III Classification       Desirable:  < 200 mg/dL               Borderline High:  200 - 239  mg/dL          High:  > = 240 mg/dL  . Triglycerides 12/25/2017 105.0  0.0 - 149.0 mg/dL Final   Normal:  <150 mg/dLBorderline High:  150 - 199 mg/dL  . HDL 12/25/2017 47.10  >39.00 mg/dL Final  . VLDL 12/25/2017 21.0  0.0 - 40.0 mg/dL Final  . LDL Cholesterol 12/25/2017 75  0 - 99 mg/dL Final  . Total CHOL/HDL Ratio 12/25/2017 3   Final                  Men          Women1/2 Average Risk     3.4          3.3Average Risk          5.0          4.42X Average Risk          9.6          7.13X Average Risk          15.0          11.0                      . NonHDL 12/25/2017 96.45   Final   NOTE:  Non-HDL goal should be 30 mg/dL higher than patient's LDL goal (i.e. LDL goal of < 70 mg/dL, would have non-HDL goal of < 100 mg/dL)  . Microalb, Ur 12/25/2017 0.7  0.0 - 1.9 mg/dL Final  . Creatinine,U 12/25/2017 78.9  mg/dL Final  . Microalb Creat Ratio 12/25/2017 0.9  0.0 - 30.0 mg/g Final  . Fructosamine 12/25/2017 374* 190 - 270 umol/L Final   Labs at goal. HbA1c calculated from the fructosamine is much better, at 7.9%!   Philemon Kingdom, MD PhD [facility] Endocrinology

## 2017-12-25 NOTE — Patient Instructions (Addendum)
  Please change: - basal rates: 12 am: 1.7 >> 1.9 4 am: 2.9 9 am: 2.2 10 pm: 2.3 - ICR:   12 am: 7 >> 6  6 pm: 6 >> 5 - target: 100-100  - ISF: 25 - Insulin on Board: 4h  Please check sugars and enter them in the pump, enter the carbs in the pump and bolus Humalog 10-15 min before every meal.  Tel nr. for the DM1 support group: [phone].  Please return in 3 to 4 months

## 2017-12-26 LAB — FRUCTOSAMINE: FRUCTOSAMINE: 374 umol/L — AB (ref 190–270)

## 2018-02-25 ENCOUNTER — Other Ambulatory Visit: Payer: Self-pay | Admitting: Internal Medicine

## 2018-03-29 ENCOUNTER — Ambulatory Visit: Payer: Self-pay | Admitting: Internal Medicine

## 2018-03-29 DIAGNOSIS — Z0289 Encounter for other administrative examinations: Secondary | ICD-10-CM

## 2018-04-24 ENCOUNTER — Telehealth: Payer: Self-pay | Admitting: Internal Medicine

## 2018-04-24 NOTE — Telephone Encounter (Signed)
Fax has been received

## 2018-04-24 NOTE — Telephone Encounter (Signed)
[employer] is calling is regards to medical necessities paperwork, they are refaxing today. Please Advise, thanks

## 2018-07-07 ENCOUNTER — Other Ambulatory Visit: Payer: Self-pay | Admitting: Internal Medicine

## 2019-01-01 IMAGING — DX DG ABDOMEN 2V
2 series · 2 of 2 positions shown · non-contrast
Comparison: CT 09/22/2016

CLINICAL DATA: Nausea, vomiting, abdominal distention

EXAM:
ABDOMEN - 2 VIEW

[abdomen supine]
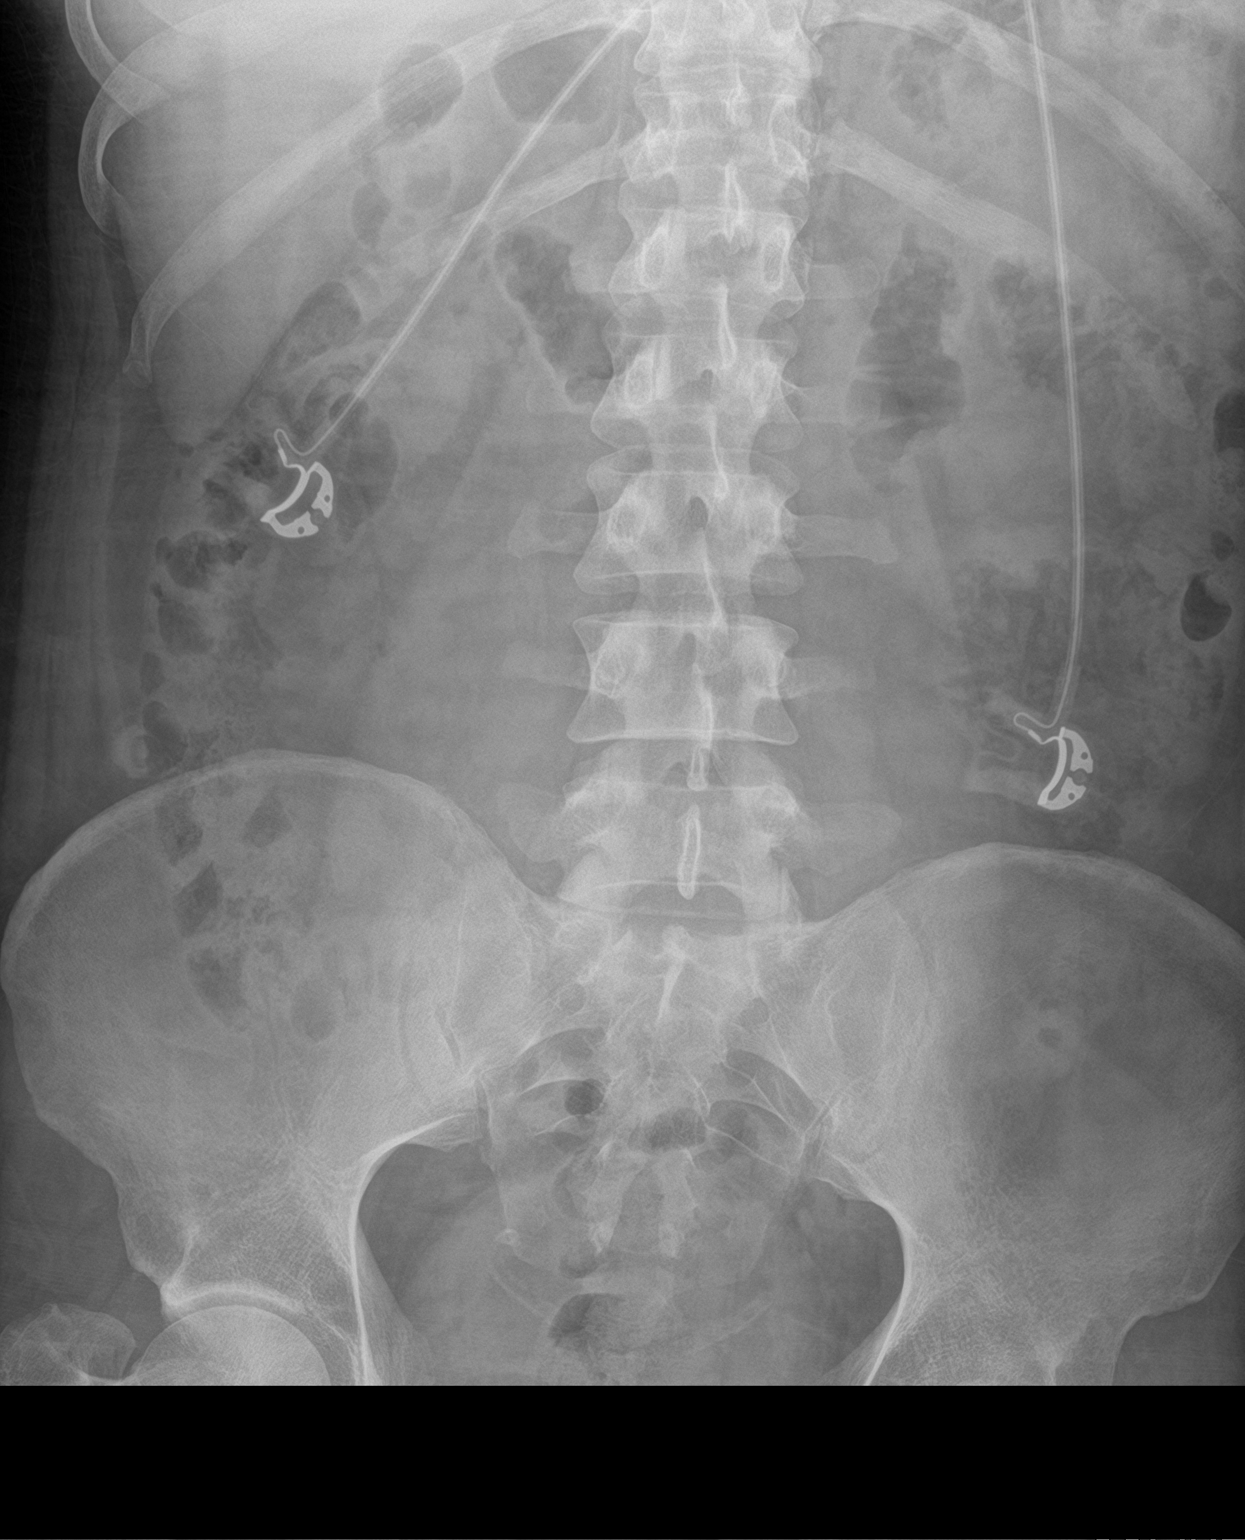

[abdomen decu]
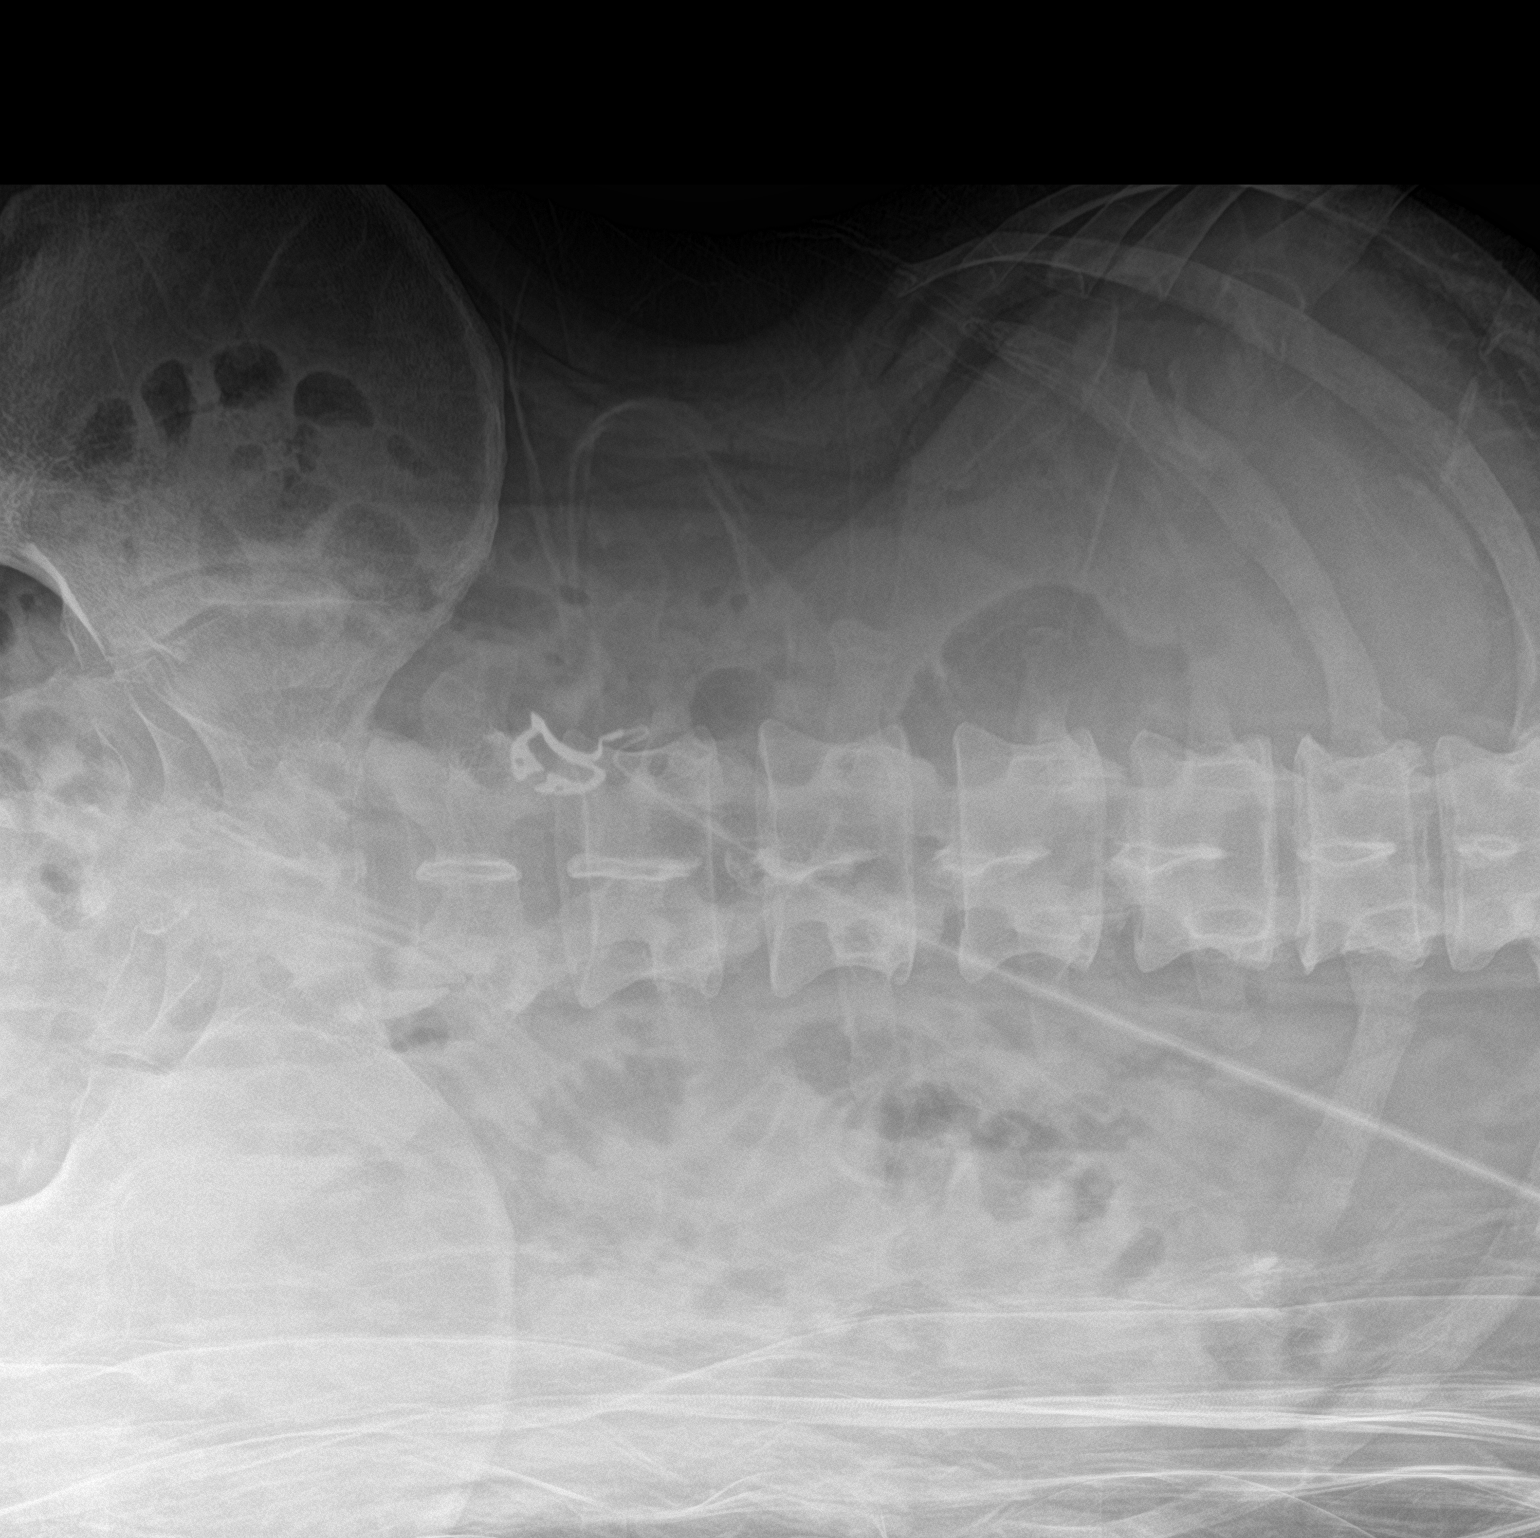

[2 of 2 positions shown; findings below may reference images not displayed]

FINDINGS: The bowel gas pattern is normal. There is no evidence of free air.
No radio-opaque calculi or other significant radiographic
abnormality is seen.
IMPRESSION: Negative.

## 2022-02-15 DIAGNOSIS — F330 Major depressive disorder, recurrent, mild: Secondary | ICD-10-CM | POA: Insufficient documentation

## 2022-12-24 ENCOUNTER — Encounter (HOSPITAL_COMMUNITY): Payer: Self-pay | Admitting: Emergency Medicine

## 2022-12-24 ENCOUNTER — Inpatient Hospital Stay (HOSPITAL_COMMUNITY)
Admission: EM | Admit: 2022-12-24 | Discharge: 2022-12-27 | DRG: 638 | Disposition: A | Discharge status: Home / Self Care | Payer: 59 | Attending: Internal Medicine | Admitting: Internal Medicine

## 2022-12-24 ENCOUNTER — Other Ambulatory Visit: Payer: Self-pay

## 2022-12-24 DIAGNOSIS — E1010 Type 1 diabetes mellitus with ketoacidosis without coma: Principal | ICD-10-CM | POA: Diagnosis present

## 2022-12-24 DIAGNOSIS — E1110 Type 2 diabetes mellitus with ketoacidosis without coma: Secondary | ICD-10-CM | POA: Diagnosis present

## 2022-12-24 DIAGNOSIS — Z91120 Patient's intentional underdosing of medication regimen due to financial hardship: Secondary | ICD-10-CM

## 2022-12-24 DIAGNOSIS — Z79899 Other long term (current) drug therapy: Secondary | ICD-10-CM

## 2022-12-24 DIAGNOSIS — Z597 Insufficient social insurance and welfare support: Secondary | ICD-10-CM

## 2022-12-24 DIAGNOSIS — Z8249 Family history of ischemic heart disease and other diseases of the circulatory system: Secondary | ICD-10-CM

## 2022-12-24 DIAGNOSIS — I10 Essential (primary) hypertension: Secondary | ICD-10-CM | POA: Diagnosis present

## 2022-12-24 DIAGNOSIS — E860 Dehydration: Secondary | ICD-10-CM | POA: Diagnosis present

## 2022-12-24 DIAGNOSIS — T383X6A Underdosing of insulin and oral hypoglycemic [antidiabetic] drugs, initial encounter: Secondary | ICD-10-CM | POA: Diagnosis present

## 2022-12-24 DIAGNOSIS — Z9641 Presence of insulin pump (external) (internal): Secondary | ICD-10-CM | POA: Diagnosis present

## 2022-12-24 DIAGNOSIS — Z833 Family history of diabetes mellitus: Secondary | ICD-10-CM

## 2022-12-24 DIAGNOSIS — N179 Acute kidney failure, unspecified: Secondary | ICD-10-CM | POA: Diagnosis present

## 2022-12-24 DIAGNOSIS — E878 Other disorders of electrolyte and fluid balance, not elsewhere classified: Secondary | ICD-10-CM | POA: Diagnosis present

## 2022-12-24 DIAGNOSIS — D72829 Elevated white blood cell count, unspecified: Secondary | ICD-10-CM | POA: Diagnosis present

## 2022-12-24 LAB — CBG MONITORING, ED: Glucose-Capillary: 575 mg/dL (ref 70–99)

## 2022-12-24 MED ORDER — FAMOTIDINE IN NACL 20-0.9 MG/50ML-% IV SOLN
20.0000 mg | INTRAVENOUS | Status: AC
  Administered 2022-12-25: 20 mg via INTRAVENOUS
  Filled 2022-12-24: qty 50

## 2022-12-24 MED ORDER — SODIUM CHLORIDE 0.9 % IV BOLUS
1000.0000 mL | Freq: Once | INTRAVENOUS | Status: AC
  Administered 2022-12-25: 1000 mL via INTRAVENOUS

## 2022-12-24 MED ORDER — PANTOPRAZOLE SODIUM 40 MG IV SOLR
40.0000 mg | Freq: Once | INTRAVENOUS | Status: AC
  Administered 2022-12-25: 40 mg via INTRAVENOUS
  Filled 2022-12-24: qty 10

## 2022-12-24 MED ORDER — ONDANSETRON HCL 4 MG/2ML IJ SOLN: 4.0000 mg | Freq: Once | INTRAMUSCULAR | Status: DC

## 2022-12-24 NOTE — ED Notes (Signed)
Patient also received 4mg  of Zofran with EMS

## 2022-12-24 NOTE — ED Triage Notes (Signed)
Patient BIB EMS for evaluation hyperglycemia and emesis.  Reports symptoms started today.  States he currently uses an insulin pump (pump is off at this time).  Was recently seen at OSH for DKA.  C/o chest pain/abdominal pain, SHOB, and nausea.

## 2022-12-25 DIAGNOSIS — E1010 Type 1 diabetes mellitus with ketoacidosis without coma: Secondary | ICD-10-CM

## 2022-12-25 LAB — CBG MONITORING, ED
Glucose-Capillary: >600 mg/dL (ref 70–99)
Glucose-Capillary: 516 mg/dL (ref 70–99)
Glucose-Capillary: 470 mg/dL — ABNORMAL HIGH (ref 70–99)
Glucose-Capillary: 335 mg/dL — ABNORMAL HIGH (ref 70–99)
Glucose-Capillary: 403 mg/dL — ABNORMAL HIGH (ref 70–99)
Glucose-Capillary: 249 mg/dL — ABNORMAL HIGH (ref 70–99)
Glucose-Capillary: 233 mg/dL — ABNORMAL HIGH (ref 70–99)
Glucose-Capillary: 230 mg/dL — ABNORMAL HIGH (ref 70–99)
Glucose-Capillary: 133 mg/dL — ABNORMAL HIGH (ref 70–99)
Glucose-Capillary: 191 mg/dL — ABNORMAL HIGH (ref 70–99)
Glucose-Capillary: 181 mg/dL — ABNORMAL HIGH (ref 70–99)
Glucose-Capillary: 144 mg/dL — ABNORMAL HIGH (ref 70–99)
Glucose-Capillary: 133 mg/dL — ABNORMAL HIGH (ref 70–99)

## 2022-12-25 LAB — CBC WITH DIFFERENTIAL/PLATELET
Abs Immature Granulocytes: 0.26 10*3/uL — ABNORMAL HIGH (ref 0.00–0.07)
Basophils Absolute: 0.1 10*3/uL (ref 0.0–0.1)
Basophils Relative: 0 %
Eosinophils Absolute: 0 10*3/uL (ref 0.0–0.5)
Eosinophils Relative: 0 %
HCT: 46.1 % (ref 39.0–52.0)
Hemoglobin: 14.9 g/dL (ref 13.0–17.0)
Immature Granulocytes: 1 %
Lymphocytes Relative: 7 %
Lymphs Abs: 2 10*3/uL (ref 0.7–4.0)
MCH: 28.9 pg (ref 26.0–34.0)
MCHC: 32.3 g/dL (ref 30.0–36.0)
MCV: 89.5 fL (ref 80.0–100.0)
Monocytes Absolute: 0.8 10*3/uL (ref 0.1–1.0)
Monocytes Relative: 3 %
Neutro Abs: 23.7 10*3/uL — ABNORMAL HIGH (ref 1.7–7.7)
Neutrophils Relative %: 89 %
Platelets: 537 10*3/uL — ABNORMAL HIGH (ref 150–400)
RBC: 5.15 MIL/uL (ref 4.22–5.81)
RDW: 13 % (ref 11.5–15.5)
WBC: 26.8 10*3/uL — ABNORMAL HIGH (ref 4.0–10.5)
nRBC: 0 % (ref 0.0–0.2)

## 2022-12-25 LAB — URINALYSIS, ROUTINE W REFLEX MICROSCOPIC
Bacteria, UA: NONE SEEN
Bilirubin Urine: NEGATIVE
Glucose, UA: >=500 mg/dL — AB
Ketones, ur: 80 mg/dL — AB
Leukocytes,Ua: NEGATIVE
Nitrite: NEGATIVE
Protein, ur: 30 mg/dL — AB
Specific Gravity, Urine: 1.026 (ref 1.005–1.030)
pH: 5 (ref 5.0–8.0)

## 2022-12-25 LAB — BASIC METABOLIC PANEL
Anion gap: 27 — ABNORMAL HIGH (ref 5–15)
Anion gap: 20 — ABNORMAL HIGH (ref 5–15)
Anion gap: 13 (ref 5–15)
Anion gap: 14 (ref 5–15)
Anion gap: 20 — ABNORMAL HIGH (ref 5–15)
BUN: 25 mg/dL — ABNORMAL HIGH (ref 6–20)
BUN: 26 mg/dL — ABNORMAL HIGH (ref 6–20)
BUN: 24 mg/dL — ABNORMAL HIGH (ref 6–20)
BUN: 23 mg/dL — ABNORMAL HIGH (ref 6–20)
BUN: 20 mg/dL (ref 6–20)
BUN: 19 mg/dL (ref 6–20)
CO2: 8 mmol/L — ABNORMAL LOW (ref 22–32)
CO2: <7 mmol/L — ABNORMAL LOW (ref 22–32)
CO2: 12 mmol/L — ABNORMAL LOW (ref 22–32)
CO2: 16 mmol/L — ABNORMAL LOW (ref 22–32)
CO2: 16 mmol/L — ABNORMAL LOW (ref 22–32)
CO2: 14 mmol/L — ABNORMAL LOW (ref 22–32)
Calcium: 8.6 mg/dL — ABNORMAL LOW (ref 8.9–10.3)
Calcium: 9 mg/dL (ref 8.9–10.3)
Calcium: 9.2 mg/dL (ref 8.9–10.3)
Calcium: 8.3 mg/dL — ABNORMAL LOW (ref 8.9–10.3)
Calcium: 8.5 mg/dL — ABNORMAL LOW (ref 8.9–10.3)
Calcium: 9 mg/dL (ref 8.9–10.3)
Chloride: 96 mmol/L — ABNORMAL LOW (ref 98–111)
Chloride: 99 mmol/L (ref 98–111)
Chloride: 104 mmol/L (ref 98–111)
Chloride: 102 mmol/L (ref 98–111)
Chloride: 102 mmol/L (ref 98–111)
Chloride: 96 mmol/L — ABNORMAL LOW (ref 98–111)
Creatinine, Ser: 1.48 mg/dL — ABNORMAL HIGH (ref 0.61–1.24)
Creatinine, Ser: 1.47 mg/dL — ABNORMAL HIGH (ref 0.61–1.24)
Creatinine, Ser: 1.35 mg/dL — ABNORMAL HIGH (ref 0.61–1.24)
Creatinine, Ser: 1.09 mg/dL (ref 0.61–1.24)
Creatinine, Ser: 0.92 mg/dL (ref 0.61–1.24)
Creatinine, Ser: 1.07 mg/dL (ref 0.61–1.24)
GFR, Estimated: >60 mL/min (ref >60)
GFR, Estimated: >60 mL/min (ref >60)
GFR, Estimated: >60 mL/min (ref >60)
GFR, Estimated: >60 mL/min (ref >60)
GFR, Estimated: >60 mL/min (ref >60)
GFR, Estimated: >60 mL/min (ref >60)
Glucose, Bld: 621 mg/dL (ref 70–99)
Glucose, Bld: 553 mg/dL (ref 70–99)
Glucose, Bld: 317 mg/dL — ABNORMAL HIGH (ref 70–99)
Glucose, Bld: 221 mg/dL — ABNORMAL HIGH (ref 70–99)
Glucose, Bld: 272 mg/dL — ABNORMAL HIGH (ref 70–99)
Glucose, Bld: 286 mg/dL — ABNORMAL HIGH (ref 70–99)
Potassium: 5.2 mmol/L — ABNORMAL HIGH (ref 3.5–5.1)
Potassium: 5.1 mmol/L (ref 3.5–5.1)
Potassium: 4.3 mmol/L (ref 3.5–5.1)
Potassium: 4.1 mmol/L (ref 3.5–5.1)
Potassium: 3.9 mmol/L (ref 3.5–5.1)
Potassium: 4.5 mmol/L (ref 3.5–5.1)
Sodium: 131 mmol/L — ABNORMAL LOW (ref 135–145)
Sodium: 134 mmol/L — ABNORMAL LOW (ref 135–145)
Sodium: 136 mmol/L (ref 135–145)
Sodium: 131 mmol/L — ABNORMAL LOW (ref 135–145)
Sodium: 132 mmol/L — ABNORMAL LOW (ref 135–145)
Sodium: 130 mmol/L — ABNORMAL LOW (ref 135–145)

## 2022-12-25 LAB — HEMOGLOBIN A1C
Hgb A1c MFr Bld: 13.1 % — ABNORMAL HIGH (ref 4.8–5.6)
Mean Plasma Glucose: 329.27 mg/dL

## 2022-12-25 LAB — CBC
HCT: 43.9 % (ref 39.0–52.0)
Hemoglobin: 14.6 g/dL (ref 13.0–17.0)
MCH: 29.1 pg (ref 26.0–34.0)
MCHC: 33.3 g/dL (ref 30.0–36.0)
MCV: 87.5 fL (ref 80.0–100.0)
Platelets: 494 10*3/uL — ABNORMAL HIGH (ref 150–400)
RBC: 5.02 MIL/uL (ref 4.22–5.81)
RDW: 13.2 % (ref 11.5–15.5)
WBC: 28.9 10*3/uL — ABNORMAL HIGH (ref 4.0–10.5)
nRBC: 0 % (ref 0.0–0.2)

## 2022-12-25 LAB — BLOOD GAS, VENOUS
Acid-base deficit: 14 mmol/L — ABNORMAL HIGH (ref 0.0–2.0)
Acid-base deficit: 19 mmol/L — ABNORMAL HIGH (ref 0.0–2.0)
Bicarbonate: 12.9 mmol/L — ABNORMAL LOW (ref 20.0–28.0)
Bicarbonate: 7.8 mmol/L — ABNORMAL LOW (ref 20.0–28.0)
O2 Saturation: 58.2 %
O2 Saturation: 91.7 %
Patient temperature: 37
Patient temperature: 37
pCO2, Ven: 22 mmHg — ABNORMAL LOW (ref 44–60)
pCO2, Ven: 33 mmHg — ABNORMAL LOW (ref 44–60)
pH, Ven: 7.16 — CL (ref 7.25–7.43)
pH, Ven: 7.2 — ABNORMAL LOW (ref 7.25–7.43)
pO2, Ven: 34 mmHg (ref 32–45)
pO2, Ven: 63 mmHg — ABNORMAL HIGH (ref 32–45)

## 2022-12-25 LAB — GLUCOSE, CAPILLARY
Glucose-Capillary: 231 mg/dL — ABNORMAL HIGH (ref 70–99)
Glucose-Capillary: 260 mg/dL — ABNORMAL HIGH (ref 70–99)

## 2022-12-25 LAB — BETA-HYDROXYBUTYRIC ACID
Beta-Hydroxybutyric Acid: >8 mmol/L — ABNORMAL HIGH (ref 0.05–0.27)
Beta-Hydroxybutyric Acid: >8 mmol/L — ABNORMAL HIGH (ref 0.05–0.27)

## 2022-12-25 LAB — MAGNESIUM: Magnesium: 2.1 mg/dL (ref 1.7–2.4)

## 2022-12-25 LAB — HIV ANTIBODY (ROUTINE TESTING W REFLEX): HIV Screen 4th Generation wRfx: NONREACTIVE

## 2022-12-25 LAB — PHOSPHORUS: Phosphorus: 4 mg/dL (ref 2.5–4.6)

## 2022-12-25 MED ORDER — POLYETHYLENE GLYCOL 3350 17 G PO PACK: 17.0000 g | PACK | Freq: Every day | ORAL | Status: DC | PRN

## 2022-12-25 MED ORDER — LABETALOL HCL 5 MG/ML IV SOLN
5.0000 mg | INTRAVENOUS | Status: DC | PRN
  Administered 2022-12-25: 5 mg via INTRAVENOUS
  Filled 2022-12-25 (×2): qty 4

## 2022-12-25 MED ORDER — CARVEDILOL 3.125 MG PO TABS
3.1250 mg | ORAL_TABLET | Freq: Once | ORAL | Status: AC
  Administered 2022-12-25: 3.125 mg via ORAL
  Filled 2022-12-25: qty 1

## 2022-12-25 MED ORDER — LISINOPRIL 20 MG PO TABS
20.0000 mg | ORAL_TABLET | Freq: Every day | ORAL | Status: DC
  Administered 2022-12-25 – 2022-12-27 (×3): 20 mg via ORAL
  Filled 2022-12-25 (×3): qty 1

## 2022-12-25 MED ORDER — INSULIN PUMP: 1.0000 | SUBCUTANEOUS | 0 refills | Status: AC

## 2022-12-25 MED ORDER — INSULIN PUMP ACCESSORIES MISC: 1.0000 | Freq: Every day | 0 refills | Status: DC

## 2022-12-25 MED ORDER — CARVEDILOL 3.125 MG PO TABS: 3.1250 mg | ORAL_TABLET | Freq: Two times a day (BID) | ORAL | 0 refills | Status: DC

## 2022-12-25 MED ORDER — INSULIN GLARGINE 100 UNIT/ML ~~LOC~~ SOLN: 40.0000 [IU] | Freq: Every day | SUBCUTANEOUS | 0 refills | Status: DC

## 2022-12-25 MED ORDER — INSULIN ASPART 100 UNIT/ML IJ SOLN
0.0000 [IU] | Freq: Three times a day (TID) | INTRAMUSCULAR | Status: DC
  Filled 2022-12-25: qty 0.2

## 2022-12-25 MED ORDER — ENOXAPARIN SODIUM 40 MG/0.4ML IJ SOSY
40.0000 mg | PREFILLED_SYRINGE | INTRAMUSCULAR | Status: DC
  Administered 2022-12-26 – 2022-12-27 (×2): 40 mg via SUBCUTANEOUS
  Filled 2022-12-25 (×3): qty 0.4

## 2022-12-25 MED ORDER — INSULIN ASPART 100 UNIT/ML IJ SOLN
10.0000 [IU] | Freq: Once | INTRAMUSCULAR | Status: AC
  Administered 2022-12-25: 10 [IU] via INTRAVENOUS
  Filled 2022-12-25: qty 0.1

## 2022-12-25 MED ORDER — HYDROMORPHONE HCL 1 MG/ML IJ SOLN
0.5000 mg | Freq: Once | INTRAMUSCULAR | Status: AC
  Administered 2022-12-25: 0.5 mg via INTRAVENOUS
  Filled 2022-12-25: qty 1

## 2022-12-25 MED ORDER — MELATONIN 5 MG PO TABS
5.0000 mg | ORAL_TABLET | Freq: Every evening | ORAL | Status: DC | PRN
  Administered 2022-12-26: 5 mg via ORAL
  Filled 2022-12-25: qty 1

## 2022-12-25 MED ORDER — ONDANSETRON HCL 4 MG/2ML IJ SOLN
4.0000 mg | Freq: Four times a day (QID) | INTRAMUSCULAR | Status: DC | PRN
  Administered 2022-12-25 – 2022-12-26 (×5): 4 mg via INTRAVENOUS
  Filled 2022-12-25 (×6): qty 2

## 2022-12-25 MED ORDER — CARVEDILOL 6.25 MG PO TABS
6.2500 mg | ORAL_TABLET | Freq: Two times a day (BID) | ORAL | Status: DC
  Administered 2022-12-26: 6.25 mg via ORAL
  Filled 2022-12-25 (×2): qty 1

## 2022-12-25 MED ORDER — INSULIN ASPART 100 UNIT/ML IJ SOLN
0.0000 [IU] | Freq: Every day | INTRAMUSCULAR | Status: DC
  Filled 2022-12-25: qty 0.05

## 2022-12-25 MED ORDER — CARVEDILOL 3.125 MG PO TABS
3.1250 mg | ORAL_TABLET | Freq: Two times a day (BID) | ORAL | Status: DC
  Administered 2022-12-25 (×2): 3.125 mg via ORAL
  Filled 2022-12-25 (×2): qty 1

## 2022-12-25 MED ORDER — INSULIN GLARGINE-YFGN 100 UNIT/ML ~~LOC~~ SOLN
40.0000 [IU] | SUBCUTANEOUS | Status: AC
  Administered 2022-12-25: 40 [IU] via SUBCUTANEOUS
  Filled 2022-12-25: qty 0.4

## 2022-12-25 MED ORDER — ACETAMINOPHEN 325 MG PO TABS
650.0000 mg | ORAL_TABLET | Freq: Four times a day (QID) | ORAL | Status: DC | PRN
  Administered 2022-12-25 – 2022-12-26 (×2): 650 mg via ORAL
  Filled 2022-12-25 (×3): qty 2

## 2022-12-25 MED ORDER — INSULIN REGULAR(HUMAN) IN NACL 100-0.9 UT/100ML-% IV SOLN
INTRAVENOUS | Status: DC
  Administered 2022-12-25: 10 [IU]/h via INTRAVENOUS
  Filled 2022-12-25: qty 100

## 2022-12-25 MED ORDER — LACTATED RINGERS IV SOLN: INTRAVENOUS | Status: DC

## 2022-12-25 MED ORDER — HYDROMORPHONE HCL 1 MG/ML IJ SOLN
0.5000 mg | Freq: Once | INTRAMUSCULAR | Status: AC | PRN
  Administered 2022-12-25: 0.5 mg via INTRAVENOUS
  Filled 2022-12-25: qty 0.5

## 2022-12-25 MED ORDER — INSULIN ASPART 100 UNIT/ML IJ SOLN
0.0000 [IU] | INTRAMUSCULAR | Status: DC
  Administered 2022-12-25: 11 [IU] via SUBCUTANEOUS
  Administered 2022-12-26: 4 [IU] via SUBCUTANEOUS
  Administered 2022-12-26: 3 [IU] via SUBCUTANEOUS
  Administered 2022-12-27 (×2): 4 [IU] via SUBCUTANEOUS

## 2022-12-25 MED ORDER — INSULIN ASPART 100 UNIT/ML IJ SOLN
20.0000 [IU] | Freq: Once | INTRAMUSCULAR | Status: AC
  Administered 2022-12-25: 20 [IU] via SUBCUTANEOUS

## 2022-12-25 MED ORDER — SODIUM CHLORIDE 0.9 % IV BOLUS
1000.0000 mL | Freq: Once | INTRAVENOUS | Status: AC
  Administered 2022-12-25: 1000 mL via INTRAVENOUS

## 2022-12-25 MED ORDER — ONDANSETRON HCL 4 MG PO TABS: 4.0000 mg | ORAL_TABLET | Freq: Four times a day (QID) | ORAL | 0 refills | Status: DC | PRN

## 2022-12-25 MED ORDER — DEXTROSE IN LACTATED RINGERS 5 % IV SOLN: INTRAVENOUS | Status: DC

## 2022-12-25 MED ORDER — PROCHLORPERAZINE EDISYLATE 10 MG/2ML IJ SOLN
5.0000 mg | Freq: Four times a day (QID) | INTRAMUSCULAR | Status: DC | PRN
  Administered 2022-12-25 – 2022-12-27 (×5): 5 mg via INTRAVENOUS
  Filled 2022-12-25 (×5): qty 2

## 2022-12-25 MED ORDER — DEXTROSE 50 % IV SOLN: 0.0000 mL | INTRAVENOUS | Status: DC | PRN

## 2022-12-25 MED ORDER — ONDANSETRON HCL 4 MG PO TABS: 4.0000 mg | ORAL_TABLET | Freq: Four times a day (QID) | ORAL | Status: DC | PRN

## 2022-12-25 MED ORDER — PROCHLORPERAZINE EDISYLATE 10 MG/2ML IJ SOLN: 5.0000 mg | Freq: Once | INTRAMUSCULAR | Status: DC

## 2022-12-25 NOTE — H&P (Addendum)
History and Physical  Barry Pittman WUJ:[id number] DOB: [birth year] DOA: 12/24/2022  Referring physician: Antony Madura, PA-EDP  PCP: Patient, No Pcp Per  Outpatient Specialists: Endocrinology in [place]. Patient coming from: Home.  Chief Complaint: Nausea vomiting and elevated blood sugar  HPI: Barry Pittman is a 37 y.o. male with medical history significant for type 1 diabetes on insulin pump, hypertension, recent admission for DKA from 12/10/2022 through 12/14/2022, who presents with complaints of nausea, vomiting and abdominal pain, that started today.  Denies subjective fevers or chills.  States he realized today that his insulin pump was out of insulin.  The patient was brought into the ED via EMS.  In the ED, the patient was noted to be in DKA type I with high anion gap metabolic acidosis.  He was promptly started on DKA protocol with IV insulin and IV fluid.  Admitted by [facility], hospitalist service for further evaluation and management.  ED Course: Temperature 98.1.  BP 147/67, pulse 129, respiratory rate 17, O2 saturation 100% on room air.  Lab studies notable for serum sodium 134, serum bicarb less than 7, glucose 553, anion gap not calculated, creatinine 1.47.  GFR greater than 60.  WBC 26.8, hemoglobin 14.9, platelet count 537.VBG pH 7.16, pCO2 22.  Review of Systems: Review of systems as noted in the HPI. All other systems reviewed and are negative.   Past Medical History:  Diagnosis Date   Diabetes mellitus without complication (HCC)    Hypertension    Past Surgical History:  Procedure Laterality Date   EYE SURGERY     For retinopathy     Social History:  reports that he has never smoked. He has never used smokeless tobacco. He reports current alcohol use. He reports that he does not use drugs.   Allergies  Allergen Reactions   Novolog [Insulin Aspart] Other (See Comments)    "goes into DKA"    Family History  Problem Relation Age of Onset    Diabetes Mother    Heart disease Mother    Diabetes Father    Heart disease Father       Prior to Admission medications   Medication Sig Start Date End Date Taking? Authorizing Provider  glucagon (GLUCAGON EMERGENCY) 1 MG injection Inject 1 mg into the muscle daily as needed for up to 1 dose (low blood sugar). 12/25/17  Yes Carlus Pavlov, MD  HUMALOG 100 UNIT/ML injection INJECT 86 UNITS SUBCUTANEOUSLY OVER 24 HOURS VIA PUMP 07/09/18  Yes Carlus Pavlov, MD  lisinopril (PRINIVIL,ZESTRIL) 20 MG tablet TAKE 1 TABLET BY MOUTH EVERY DAY 02/26/18  Yes Carlus Pavlov, MD  aluminum-magnesium hydroxide-simethicone (MAALOX) 200-200-20 MG/5ML SUSP Take 30 mLs by mouth 3 (three) times daily between meals as needed. Patient not taking: Reported on 12/25/2022 11/28/17   Briant Cedar, MD  amoxicillin (AMOXIL) 500 MG tablet Take 500 mg by mouth every 8 (eight) hours. Patient not taking: Reported on 12/25/2022 12/07/22   [provider]  blood glucose meter kit and supplies KIT Dispense based on patient and insurance preference. Use up to four times daily as directed. (FOR ICD-9 250.00, 250.01). 11/23/17   Darlin Drop, DO  carvedilol (COREG) 6.25 MG tablet Take 6.25 mg by mouth 2 (two) times daily with a meal. Patient not taking: Reported on 12/25/2022 12/14/22 12/14/23  [provider]  chlorhexidine (PERIDEX) 0.12 % solution 2 (two) times daily. Patient not taking: Reported on 12/25/2022 12/07/22   [provider]  folic acid (  FOLVITE) 1 MG tablet Take 1 tablet (1 mg total) by mouth daily. Patient not taking: Reported on 12/25/2022 11/23/17   Darlin Drop, DO  ibuprofen (ADVIL) 800 MG tablet Take 800 mg by mouth every 8 (eight) hours as needed. Patient not taking: Reported on 12/25/2022 12/07/22   [provider]  insulin glargine (LANTUS) 100 UNIT/ML injection Inject 0.5 mLs (50 Units total) into the skin daily. Patient not taking: Reported on 12/25/2022 11/23/17   Darlin Drop, DO  Lancets (ACCU-CHEK SOFT [facility]) lancets Use as directed. 11/23/17   Darlin Drop, DO  Multiple Vitamin (MULTIVITAMIN WITH MINERALS) TABS tablet Take 1 tablet by mouth daily. Patient not taking: Reported on 12/25/2022 11/23/17   Darlin Drop, DO  pantoprazole (PROTONIX) 40 MG tablet Take 1 tablet (40 mg total) by mouth 2 (two) times daily before a meal. 11/28/17 12/28/17  Briant Cedar, MD  thiamine 100 MG tablet Take 1 tablet (100 mg total) by mouth daily. Patient not taking: Reported on 12/25/2022 11/23/17   Darlin Drop, DO    Physical Exam: BP (!) 147/67   Pulse (!) 129   Temp 98.1 F (36.7 C) (Oral)   Resp 17   SpO2 100%   General: 37 y.o. year-old male well developed well nourished in no acute distress.  Alert and oriented x3. Cardiovascular: Tachycardic with no rubs or gallops.  No thyromegaly or JVD noted.  No lower extremity edema. 2/4 pulses in all 4 extremities. Respiratory: Clear to auscultation with no wheezes or rales. Good inspiratory effort. Abdomen: Soft nontender nondistended with normal bowel sounds x4 quadrants. Muskuloskeletal: No cyanosis, clubbing or edema noted bilaterally Neuro: CN II-XII intact, strength, sensation, reflexes Skin: No ulcerative lesions noted or rashes Psychiatry: Judgement and insight appear normal. Mood is appropriate for condition and setting          Labs on Admission:  Basic Metabolic Panel: Recent Labs  Lab 12/25/22 0000  NA 131*  K 5.2*  CL 96*  CO2 8*  GLUCOSE 621*  BUN 25*  CREATININE 1.48*  CALCIUM 8.6*   Liver Function Tests: No results for input(s): "AST", "ALT", "ALKPHOS", "BILITOT", "PROT", "ALBUMIN" in the last 168 hours. No results for input(s): "LIPASE", "AMYLASE" in the last 168 hours. No results for input(s): "AMMONIA" in the last 168 hours. CBC: Recent Labs  Lab 12/25/22 0000  WBC 26.8*  NEUTROABS 23.7*  HGB 14.9  HCT 46.1  MCV 89.5  PLT 537*   Cardiac Enzymes: No results for input(s):  "CKTOTAL", "CKMB", "CKMBINDEX", "TROPONINI" in the last 168 hours.  BNP (last 3 results) No results for input(s): "BNP" in the last 8760 hours.  ProBNP (last 3 results) No results for input(s): "PROBNP" in the last 8760 hours.  CBG: Recent Labs  Lab 12/24/22 2339 12/25/22 0058 12/25/22 0201  GLUCAP 575* >600* 516*    Radiological Exams on Admission: No results found.  EKG: I independently viewed the EKG done and my findings are as followed: None available at the time of this visit.  Assessment/Plan Present on Admission:  DKA, type 1 (HCC)  Principal Problem:   DKA, type 1 (HCC)  DKA type I, likely due to noncompliance with insulin regimen. DKA protocol in place Continue insulin drip Continue IV fluid hydration Obtain hemoglobin A1c BMP every 4 hours Beta-hydroxybutyrate acid every 8 hours Diabetes coordinator consult  High anion gap metabolic acidosis Serum bicarb 8, anion gap 27, pH 7.16 on VBG Continue insulin drip and IV  fluid BMP every 4 hours to reassess Repeat VBG to reassess his pH  AKI secondary to dehydration from DKA Baseline creatinine 0.6 with GFR greater than 60 Presented with creatinine 1.48 Continue IV fluid hydration Avoid nephrotoxic agents, dehydration and hypotension. Monitor urine output.  Leukocytosis, suspect reactive in the setting of DKA WBC 26.8 Afebrile UA negative No upper respiratory symptoms Repeat CBC  Nausea vomiting abdominal pain suspect secondary to DKA Symptoms have improved with IV insulin and IV fluid Continue to treat underlying conditions As needed IV antiemetics As needed analgesics  Hypertension Sinus tachycardia Resume home Coreg Closely monitor vital signs   Time: 75 minutes.   DVT prophylaxis: Subcu Lovenox daily  Code Status: Full code  Family Communication: None at bedside  Disposition Plan: Admitted to stepdown unit  Consults called: Diabetes coordinator  Admission status: Inpatient  status.   Status is: Inpatient The patient requires at least 2 midnight for further evaluation and treatment of present condition.   Darlin Drop MD [facility] Pager [phone]  If 7PM-7AM, please contact night-coverage [email or web address] Password [email or web address]  12/25/2022, 2:19 AM

## 2022-12-25 NOTE — Progress Notes (Signed)
  [facility] ZERO NOTE    MOXLEY LAMPORT  ZOX:[id number] DOB: 07/30/1985 DOA: 12/24/2022 PCP: Patient, No Pcp Per   Brief Narrative:  Barry Pittman is a 37 y.o. male who reports profound noncompliance despite history of type 1 diabetes on insulin pump, hypertension with multiple hospitalizations over the past year, most recently 8/24 through 8/28.  Patient indicates he has had a "stressful" year with multiple social stressors including divorce as well as recently losing his job and subsequently his insurance.  Patient indicates he has not been consistent with his home insulin due to cost and insurance issues.  Overnight patient's anion gap remains elevated, intractable nausea vomiting this morning requiring ongoing treatment.  Will attempt to transition patient off insulin drip onto long-acting and sliding scale insulin.  Will likely need evaluation by [facility] for medication pricing given his lack of insurance/financial burden.   Assessment & Plan:   Principal Problem:   DKA, type 1 (HCC)   Subjective: Worsening abdominal discomfort, nausea and vomiting with inability to take p.o. appropriately.  Otherwise denies headache fevers chills chest pain diarrhea or constipation.  Objective: Vitals:   12/25/22 0030 12/25/22 0045 12/25/22 0216 12/25/22 0431  BP: (!) 148/70 (!) 147/67  (!) 158/85  Pulse: (!) 127 (!) 129  (!) 130  Resp:  17  17  Temp:    97.7 F (36.5 C)  TempSrc:      SpO2: 98% 100%  100%  Weight:   86.2 kg   Height:   5\' 11"  (1.803 m)     Intake/Output Summary (Last 24 hours) at 12/25/2022 0757 Last data filed at 12/25/2022 0536 Gross per 24 hour  Intake 929.46 ml  Output --  Net 929.46 ml   Filed Weights   12/25/22 0216  Weight: 86.2 kg   Time spent: 45 minutes  Azucena Fallen, DO [facility]  If 7PM-7AM, please contact night-coverage [email or web address]  12/25/2022, 7:57 AM

## 2022-12-25 NOTE — ED Notes (Signed)
ED TO INPATIENT HANDOFF REPORT  Name/Age/Gender Barry Pittman 37 y.o. male  Code Status    Code Status Orders  (From admission, onward)           Start     Ordered   12/25/22 0217  Full code  Continuous       Question:  By:  Answer:  Consent: discussion documented in EHR   12/25/22 0217           Code Status History     Date Active Date Inactive Code Status Order ID Comments User Context   11/26/2017 1645 11/28/2017 1540 Full Code [id number]  Bobette Mo, MD Inpatient   11/26/2017 1645 11/26/2017 1645 Full Code [id number]  Bobette Mo, MD Inpatient   11/18/2017 1807 11/23/2017 1910 Full Code [id number]  Myrtie Neither, MD ED   09/22/2016 0010 09/27/2016 1942 Full Code [id number]  Eduard Clos, MD ED   05/26/2015 1213 05/31/2015 1859 Full Code [id number]  Jeanella Craze, NP ED       Home/SNF/Other Home  Chief Complaint DKA, 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]  Level of Care/Admitting Diagnosis ED Disposition     ED Disposition  Admit   Condition  --   Comment  Hospital Area: [facility] [[id number]]  Level of Care: Med-Surg [16]  May place patient in observation at [facility] or [facility] if equivalent level of care is available:: No  Covid Evaluation: Confirmed COVID Negative  Diagnosis: DKA, type 1 [facility]) [[id number]]  Admitting Physician: Azucena Fallen [[id number]]  Attending Physician: Azucena Fallen [[id number]]          Medical History Past Medical History:  Diagnosis Date   Diabetes mellitus without complication (HCC)    Hypertension     Allergies Allergies  Allergen Reactions   Novolog [Insulin Aspart] Other (See Comments)    "goes into DKA"    IV Location/Drains/Wounds Patient Lines/Drains/Airways Status     Active Line/Drains/Airways     Name Placement date Placement time Site Days   Peripheral IV 12/24/22 20 G Left Antecubital 12/24/22  0000  Antecubital  1   Peripheral IV 12/25/22 22 G 1"  Left;Posterior Hand 12/25/22  0226  Hand  less than 1   Wound / Incision (Open or Dehisced) 05/26/15 Other (Comment) Leg Left;Posterior reddened area size of quarter, and hot to touch. 05/26/15  1500  Leg  2770            Labs/Imaging Results for orders placed or performed during the hospital encounter of 12/24/22 (from the past 48 hour(s))  CBG monitoring, ED     Status: Abnormal   Collection Time: 12/24/22 11:39 PM  Result Value Ref Range   Glucose-Capillary 575 (HH) 70 - 99 mg/dL    Comment: Glucose reference range applies only to samples taken after fasting for at least 8 hours.   Comment 1 Notify RN    Comment 2 Document in Chart   Blood gas, venous (at [facility] and [facility])     Status: Abnormal   Collection Time: 12/25/22 12:00 AM  Result Value Ref Range   pH, Ven 7.16 (LL) 7.25 - 7.43    Comment: CRITICAL RESULT CALLED TO, READ BACK BY AND VERIFIED WITH: GRAY S. RN @0025  12/25/22 MCLEAN K.    pCO2, Ven 22 (L) 44 - 60 mmHg   pO2, Ven 63 (H) 32 - 45 mmHg   Bicarbonate 7.8 (L) 20.0 - 28.0 mmol/L   Acid-base deficit 19.0 (  H) 0.0 - 2.0 mmol/L   O2 Saturation 91.7 %   Patient temperature 37.0     Comment: Performed at [facility], [address] [place], [place] [address]  Beta-hydroxybutyric acid     Status: Abnormal   Collection Time: 12/25/22 12:00 AM  Result Value Ref Range   Beta-Hydroxybutyric Acid >8.00 (H) 0.05 - 0.27 mmol/L    Comment: RESULT CONFIRMED BY MANUAL DILUTION Performed at [facility], [address] [place], [place] [address]   CBC with Differential     Status: Abnormal   Collection Time: 12/25/22 12:00 AM  Result Value Ref Range   WBC 26.8 (H) 4.0 - 10.5 K/uL   RBC 5.15 4.22 - 5.81 MIL/uL   Hemoglobin 14.9 13.0 - 17.0 g/dL   HCT 84.1 32.4 - 40.1 %   MCV 89.5 80.0 - 100.0 fL   MCH 28.9 26.0 - 34.0 pg   MCHC 32.3 30.0 - 36.0 g/dL   RDW 02.7 25.3 - 66.4 %   Platelets 537 (H) 150 - 400 K/uL   nRBC 0.0 0.0 - 0.2 %    Neutrophils Relative % 89 %   Neutro Abs 23.7 (H) 1.7 - 7.7 K/uL   Lymphocytes Relative 7 %   Lymphs Abs 2.0 0.7 - 4.0 K/uL   Monocytes Relative 3 %   Monocytes Absolute 0.8 0.1 - 1.0 K/uL   Eosinophils Relative 0 %   Eosinophils Absolute 0.0 0.0 - 0.5 K/uL   Basophils Relative 0 %   Basophils Absolute 0.1 0.0 - 0.1 K/uL   Immature Granulocytes 1 %   Abs Immature Granulocytes 0.26 (H) 0.00 - 0.07 K/uL    Comment: Performed at [facility], [address] [place], [place] [address]  Basic metabolic panel     Status: Abnormal   Collection Time: 12/25/22 12:00 AM  Result Value Ref Range   Sodium 131 (L) 135 - 145 mmol/L    Comment: ELECTROLYTES REPEATED TO VERIFY   Potassium 5.2 (H) 3.5 - 5.1 mmol/L   Chloride 96 (L) 98 - 111 mmol/L    Comment: ELECTROLYTES REPEATED TO VERIFY   CO2 8 (L) 22 - 32 mmol/L    Comment: ELECTROLYTES REPEATED TO VERIFY   Glucose, Bld 621 (HH) 70 - 99 mg/dL    Comment: CRITICAL RESULT CALLED TO, READ BACK BY AND VERIFIED WITH HALL C. @ 0124 12/25/22 MCLEAN K. Glucose reference range applies only to samples taken after fasting for at least 8 hours.    BUN 25 (H) 6 - 20 mg/dL   Creatinine, Ser 4.25 (H) 0.61 - 1.24 mg/dL   Calcium 8.6 (L) 8.9 - 10.3 mg/dL   GFR, Estimated >95 >63 mL/min    Comment: (NOTE) Calculated using the CKD-EPI Creatinine Equation (2021)    Anion gap 27 (H) 5 - 15    Comment: Performed at [facility], [address] [place], [place] [address]  CBG monitoring, ED     Status: Abnormal   Collection Time: 12/25/22 12:58 AM  Result Value Ref Range   Glucose-Capillary >600 (HH) 70 - 99 mg/dL    Comment: Glucose reference range applies only to samples taken after fasting for at least 8 hours.  Urinalysis, Routine w reflex microscopic -Urine, Clean Catch     Status: Abnormal   Collection Time: 12/25/22  1:01 AM  Result Value Ref Range   Color, Urine YELLOW YELLOW   APPearance CLEAR CLEAR    Specific  Gravity, Urine 1.026 1.005 - 1.030   pH 5.0 5.0 - 8.0   Glucose, UA >=500 (A) NEGATIVE mg/dL   Hgb urine dipstick SMALL (A) NEGATIVE   Bilirubin Urine NEGATIVE NEGATIVE   Ketones, ur 80 (A) NEGATIVE mg/dL   Protein, ur 30 (A) NEGATIVE mg/dL   Nitrite NEGATIVE NEGATIVE   Leukocytes,Ua NEGATIVE NEGATIVE   RBC / HPF 0-5 0 - 5 RBC/hpf   WBC, UA 0-5 0 - 5 WBC/hpf   Bacteria, UA NONE SEEN NONE SEEN   Squamous Epithelial / HPF 0-5 0 - 5 /HPF   Mucus PRESENT    Hyaline Casts, UA PRESENT     Comment: Performed at [facility], [address] [place], [place] [address]  CBG monitoring, ED     Status: Abnormal   Collection Time: 12/25/22  2:01 AM  Result Value Ref Range   Glucose-Capillary 516 (HH) 70 - 99 mg/dL    Comment: Glucose reference range applies only to samples taken after fasting for at least 8 hours.  HIV Antibody (routine testing w rflx)     Status: None   Collection Time: 12/25/22  2:27 AM  Result Value Ref Range   HIV Screen 4th Generation wRfx Non Reactive Non Reactive    Comment: Performed at [facility], [address], [place], [place] [address]  Basic metabolic panel     Status: Abnormal   Collection Time: 12/25/22  2:27 AM  Result Value Ref Range   Sodium 134 (L) 135 - 145 mmol/L    Comment: ELECTROLYTES REPEATED TO VERIFY   Potassium 5.1 3.5 - 5.1 mmol/L   Chloride 99 98 - 111 mmol/L    Comment: ELECTROLYTES REPEATED TO VERIFY   CO2 <7 (L) 22 - 32 mmol/L    Comment: ELECTROLYTES REPEATED TO VERIFY   Glucose, Bld 553 (HH) 70 - 99 mg/dL    Comment: CRITICAL RESULT CALLED TO, READ BACK BY AND VERIFIED WITH ESPRADA G. @336  12/25/22 MCLEAN K. Glucose reference range applies only to samples taken after fasting for at least 8 hours.    BUN 26 (H) 6 - 20 mg/dL   Creatinine, Ser 4.13 (H) 0.61 - 1.24 mg/dL   Calcium 9.0 8.9 - 24.4 mg/dL   GFR, Estimated >01 >02 mL/min    Comment: (NOTE) Calculated using the CKD-EPI Creatinine Equation  (2021)    Anion gap NOT CALCULATED 5 - 15    Comment: Performed at [facility], [address], [place], [place] [address]  Beta-hydroxybutyric acid     Status: Abnormal   Collection Time: 12/25/22  2:27 AM  Result Value Ref Range   Beta-Hydroxybutyric Acid >8.00 (H) 0.05 - 0.27 mmol/L    Comment: RESULT CONFIRMED BY MANUAL DILUTION Performed at [facility], [address], [place], [place] [address]   Hemoglobin A1c     Status: Abnormal   Collection Time: 12/25/22  2:27 AM  Result Value Ref Range   Hgb A1c MFr Bld 13.1 (H) 4.8 - 5.6 %    Comment: (NOTE) Pre diabetes:          5.7%-6.4%  Diabetes:              >6.4%  Glycemic control for   <7.0% adults with diabetes    Mean Plasma Glucose 329.27 mg/dL    Comment: Performed at [facility], [address], [place], [place] [address]  CBG monitoring, ED     Status:  Abnormal   Collection Time: 12/25/22  3:14 AM  Result Value Ref Range   Glucose-Capillary 470 (H) 70 - 99 mg/dL    Comment: Glucose reference range applies only to samples taken after fasting for at least 8 hours.  CBG monitoring, ED     Status: Abnormal   Collection Time: 12/25/22  3:51 AM  Result Value Ref Range   Glucose-Capillary 403 (H) 70 - 99 mg/dL    Comment: Glucose reference range applies only to samples taken after fasting for at least 8 hours.  CBG monitoring, ED     Status: Abnormal   Collection Time: 12/25/22  4:25 AM  Result Value Ref Range   Glucose-Capillary 335 (H) 70 - 99 mg/dL    Comment: Glucose reference range applies only to samples taken after fasting for at least 8 hours.  CBC     Status: Abnormal   Collection Time: 12/25/22  4:55 AM  Result Value Ref Range   WBC 28.9 (H) 4.0 - 10.5 K/uL   RBC 5.02 4.22 - 5.81 MIL/uL   Hemoglobin 14.6 13.0 - 17.0 g/dL   HCT 84.1 32.4 - 40.1 %   MCV 87.5 80.0 - 100.0 fL   MCH 29.1 26.0 - 34.0 pg   MCHC 33.3 30.0 - 36.0 g/dL   RDW 02.7 25.3 - 66.4 %    Platelets 494 (H) 150 - 400 K/uL   nRBC 0.0 0.0 - 0.2 %    Comment: Performed at [facility], [address], [place], [place] [address]  Magnesium     Status: None   Collection Time: 12/25/22  4:55 AM  Result Value Ref Range   Magnesium 2.1 1.7 - 2.4 mg/dL    Comment: Performed at [facility], [address], [place], [place] [address]  Phosphorus     Status: None   Collection Time: 12/25/22  4:55 AM  Result Value Ref Range   Phosphorus 4.0 2.5 - 4.6 mg/dL    Comment: Performed at [facility], [address], [place], [place] [address]  Blood gas, venous     Status: Abnormal   Collection Time: 12/25/22  4:55 AM  Result Value Ref Range   pH, Ven 7.2 (L) 7.25 - 7.43   pCO2, Ven 33 (L) 44 - 60 mmHg   pO2, Ven 34 32 - 45 mmHg   Bicarbonate 12.9 (L) 20.0 - 28.0 mmol/L   Acid-base deficit 14.0 (H) 0.0 - 2.0 mmol/L   O2 Saturation 58.2 %   Patient temperature 37.0     Comment: Performed at [facility], [address] [place], [place] [address]  Basic metabolic panel     Status: Abnormal   Collection Time: 12/25/22  4:55 AM  Result Value Ref Range   Sodium 136 135 - 145 mmol/L    Comment: ELECTROLYTES REPEATED TO VERIFY   Potassium 4.3 3.5 - 5.1 mmol/L   Chloride 104 98 - 111 mmol/L    Comment: ELECTROLYTES REPEATED TO VERIFY   CO2 12 (L) 22 - 32 mmol/L    Comment: ELECTROLYTES REPEATED TO VERIFY   Glucose, Bld 317 (H) 70 - 99 mg/dL    Comment: Glucose reference range applies only to samples taken after fasting for at least 8 hours.   BUN 24 (H) 6 - 20 mg/dL   Creatinine, Ser 9.51 (H) 0.61 - 1.24 mg/dL   Calcium 9.2 8.9 - 88.4 mg/dL   GFR, Estimated >16 >60 mL/min  Comment: (NOTE) Calculated using the CKD-EPI Creatinine Equation (2021)    Anion gap 20 (H) 5 - 15    Comment: Performed at [facility], [address], [place], [place] [address]  CBG monitoring, ED     Status:  Abnormal   Collection Time: 12/25/22  5:26 AM  Result Value Ref Range   Glucose-Capillary 249 (H) 70 - 99 mg/dL    Comment: Glucose reference range applies only to samples taken after fasting for at least 8 hours.  CBG monitoring, ED     Status: Abnormal   Collection Time: 12/25/22  6:28 AM  Result Value Ref Range   Glucose-Capillary 233 (H) 70 - 99 mg/dL    Comment: Glucose reference range applies only to samples taken after fasting for at least 8 hours.  Basic metabolic panel     Status: Abnormal   Collection Time: 12/25/22  8:00 AM  Result Value Ref Range   Sodium 131 (L) 135 - 145 mmol/L   Potassium 4.1 3.5 - 5.1 mmol/L   Chloride 102 98 - 111 mmol/L   CO2 16 (L) 22 - 32 mmol/L   Glucose, Bld 221 (H) 70 - 99 mg/dL    Comment: Glucose reference range applies only to samples taken after fasting for at least 8 hours.   BUN 23 (H) 6 - 20 mg/dL   Creatinine, Ser 5.28 0.61 - 1.24 mg/dL   Calcium 8.3 (L) 8.9 - 10.3 mg/dL   GFR, Estimated >41 >32 mL/min    Comment: (NOTE) Calculated using the CKD-EPI Creatinine Equation (2021)    Anion gap 13 5 - 15    Comment: Performed at [facility], [address], [place], [place] [address]  CBG monitoring, ED     Status: Abnormal   Collection Time: 12/25/22  8:01 AM  Result Value Ref Range   Glucose-Capillary 230 (H) 70 - 99 mg/dL    Comment: Glucose reference range applies only to samples taken after fasting for at least 8 hours.  CBG monitoring, ED     Status: Abnormal   Collection Time: 12/25/22  9:09 AM  Result Value Ref Range   Glucose-Capillary 133 (H) 70 - 99 mg/dL    Comment: Glucose reference range applies only to samples taken after fasting for at least 8 hours.  CBG monitoring, ED     Status: Abnormal   Collection Time: 12/25/22 10:56 AM  Result Value Ref Range   Glucose-Capillary 191 (H) 70 - 99 mg/dL    Comment: Glucose reference range applies only to samples taken after fasting for at least 8 hours.  CBG  monitoring, ED     Status: Abnormal   Collection Time: 12/25/22 11:58 AM  Result Value Ref Range   Glucose-Capillary 181 (H) 70 - 99 mg/dL    Comment: Glucose reference range applies only to samples taken after fasting for at least 8 hours.  Basic metabolic panel     Status: Abnormal   Collection Time: 12/25/22 12:55 PM  Result Value Ref Range   Sodium 132 (L) 135 - 145 mmol/L   Potassium 3.9 3.5 - 5.1 mmol/L   Chloride 102 98 - 111 mmol/L   CO2 16 (L) 22 - 32 mmol/L   Glucose, Bld 272 (H) 70 - 99 mg/dL    Comment: Glucose reference range applies only to samples taken after fasting for at least 8 hours.   BUN 20 6 - 20 mg/dL   Creatinine, Ser 2.72 0.61 -  1.24 mg/dL   Calcium 8.5 (L) 8.9 - 10.3 mg/dL   GFR, Estimated >72 >53 mL/min    Comment: (NOTE) Calculated using the CKD-EPI Creatinine Equation (2021)    Anion gap 14 5 - 15    Comment: Performed at [facility], [address] [place], [place] [address]  CBG monitoring, ED     Status: Abnormal   Collection Time: 12/25/22  1:24 PM  Result Value Ref Range   Glucose-Capillary 144 (H) 70 - 99 mg/dL    Comment: Glucose reference range applies only to samples taken after fasting for at least 8 hours.  CBG monitoring, ED     Status: Abnormal   Collection Time: 12/25/22  2:33 PM  Result Value Ref Range   Glucose-Capillary 133 (H) 70 - 99 mg/dL    Comment: Glucose reference range applies only to samples taken after fasting for at least 8 hours.   No results found.  Pending Labs Unresulted Labs (From admission, onward)     Start     Ordered   01/01/23 0500  Creatinine, serum  (enoxaparin (LOVENOX)    CrCl >/= 30 ml/min)  Weekly,   R     Comments: while on enoxaparin therapy    12/25/22 0217   12/25/22 0219  Basic metabolic panel  Now then every 4 hours,   R (with TIMED occurrences)      12/25/22 0218            Vitals/Pain Today's Vitals   12/25/22 1224 12/25/22 1230 12/25/22 1300 12/25/22 1525   BP:  137/73 (!) 166/95   Pulse:  (!) 102 (!) 102   Resp:  17 17   Temp:    98.4 F (36.9 C)  TempSrc:    Oral  SpO2:  99% 100%   Weight:      Height:      PainSc: 0-No pain       Isolation Precautions No active isolations  Medications Medications  dextrose 50 % solution 0-50 mL (has no administration in time range)  enoxaparin (LOVENOX) injection 40 mg (40 mg Subcutaneous Not Given 12/25/22 1026)  carvedilol (COREG) tablet 3.125 mg (3.125 mg Oral Given 12/25/22 0523)  acetaminophen (TYLENOL) tablet 650 mg (650 mg Oral Given 12/25/22 1015)  prochlorperazine (COMPAZINE) injection 5 mg (5 mg Intravenous Given 12/25/22 1330)  melatonin tablet 5 mg (has no administration in time range)  polyethylene glycol (MIRALAX / GLYCOLAX) packet 17 g (has no administration in time range)  ondansetron (ZOFRAN) tablet 4 mg ( Oral See Alternative 12/25/22 1009)    Or  ondansetron (ZOFRAN) injection 4 mg (4 mg Intravenous Given 12/25/22 1009)  insulin aspart (novoLOG) injection 0-20 Units (has no administration in time range)  insulin aspart (novoLOG) injection 0-5 Units (has no administration in time range)  sodium chloride 0.9 % bolus 1,000 mL (0 mLs Intravenous Stopped 12/25/22 0203)  pantoprazole (PROTONIX) injection 40 mg (40 mg Intravenous Given 12/25/22 0037)  famotidine (PEPCID) IVPB 20 mg premix (0 mg Intravenous Stopped 12/25/22 0114)  insulin aspart (novoLOG) injection 10 Units (10 Units Intravenous Given 12/25/22 0059)  HYDROmorphone (DILAUDID) injection 0.5 mg (0.5 mg Intravenous Given 12/25/22 0109)  sodium chloride 0.9 % bolus 1,000 mL (0 mLs Intravenous Stopped 12/25/22 0231)  insulin glargine-yfgn (SEMGLEE) injection 40 Units (40 Units Subcutaneous Given 12/25/22 1534)    Mobility walks

## 2022-12-25 NOTE — Plan of Care (Signed)
Problem: Education: Goal: Ability to describe self-care measures that may prevent or decrease complications (Diabetes Survival Skills Education) will improve 12/25/2022 1928 by Paulla Fore, RN Outcome: Progressing 12/25/2022 1928 by Paulla Fore, RN Outcome: Progressing Goal: Individualized Educational Video(s) 12/25/2022 1928 by Paulla Fore, RN Outcome: Progressing 12/25/2022 1928 by Paulla Fore, RN Outcome: Progressing   Problem: Coping: Goal: Ability to adjust to condition or change in health will improve 12/25/2022 1928 by Paulla Fore, RN Outcome: Progressing 12/25/2022 1928 by Paulla Fore, RN Outcome: Progressing   Problem: Fluid Volume: Goal: Ability to maintain a balanced intake and output will improve 12/25/2022 1928 by Paulla Fore, RN Outcome: Progressing 12/25/2022 1928 by Paulla Fore, RN Outcome: Progressing   Problem: Health Behavior/Discharge Planning: Goal: Ability to identify and utilize available resources and services will improve 12/25/2022 1928 by Paulla Fore, RN Outcome: Progressing 12/25/2022 1928 by Paulla Fore, RN Outcome: Progressing Goal: Ability to manage health-related needs will improve 12/25/2022 1928 by Paulla Fore, RN Outcome: Progressing 12/25/2022 1928 by Paulla Fore, RN Outcome: Progressing   Problem: Metabolic: Goal: Ability to maintain appropriate glucose levels will improve 12/25/2022 1928 by Paulla Fore, RN Outcome: Progressing 12/25/2022 1928 by Paulla Fore, RN Outcome: Progressing   Problem: Nutritional: Goal: Maintenance of adequate nutrition will improve 12/25/2022 1928 by Paulla Fore, RN Outcome: Progressing 12/25/2022 1928 by Paulla Fore, RN Outcome: Progressing Goal: Progress toward achieving an optimal weight will improve 12/25/2022 1928 by Paulla Fore, RN Outcome: Progressing 12/25/2022 1928 by Paulla Fore, RN Outcome: Progressing   Problem:  Skin Integrity: Goal: Risk for impaired skin integrity will decrease 12/25/2022 1928 by Paulla Fore, RN Outcome: Progressing 12/25/2022 1928 by Paulla Fore, RN Outcome: Progressing   Problem: Tissue Perfusion: Goal: Adequacy of tissue perfusion will improve 12/25/2022 1928 by Paulla Fore, RN Outcome: Progressing 12/25/2022 1928 by Paulla Fore, RN Outcome: Progressing   Problem: Education: Goal: Knowledge of General Education information will improve Description: Including pain rating scale, medication(s)/side effects and non-pharmacologic comfort measures 12/25/2022 1928 by Paulla Fore, RN Outcome: Progressing 12/25/2022 1928 by Paulla Fore, RN Outcome: Progressing   Problem: Health Behavior/Discharge Planning: Goal: Ability to manage health-related needs will improve 12/25/2022 1928 by Paulla Fore, RN Outcome: Progressing 12/25/2022 1928 by Paulla Fore, RN Outcome: Progressing   Problem: Clinical Measurements: Goal: Ability to maintain clinical measurements within normal limits will improve 12/25/2022 1928 by Paulla Fore, RN Outcome: Progressing 12/25/2022 1928 by Paulla Fore, RN Outcome: Progressing Goal: Will remain free from infection 12/25/2022 1928 by Paulla Fore, RN Outcome: Progressing 12/25/2022 1928 by Paulla Fore, RN Outcome: Progressing Goal: Diagnostic test results will improve 12/25/2022 1928 by Paulla Fore, RN Outcome: Progressing 12/25/2022 1928 by Paulla Fore, RN Outcome: Progressing Goal: Respiratory complications will improve 12/25/2022 1928 by Paulla Fore, RN Outcome: Progressing 12/25/2022 1928 by Paulla Fore, RN Outcome: Progressing Goal: Cardiovascular complication will be avoided 12/25/2022 1928 by Paulla Fore, RN Outcome: Progressing 12/25/2022 1928 by Paulla Fore, RN Outcome: Progressing   Problem: Activity: Goal: Risk for activity intolerance will  decrease 12/25/2022 1928 by Paulla Fore, RN Outcome: Progressing 12/25/2022 1928 by Paulla Fore, RN Outcome: Progressing   Problem: Nutrition: Goal: Adequate nutrition will be maintained 12/25/2022 1928 by Paulla Fore, RN Outcome: Progressing 12/25/2022 1928 by Paulla Fore, RN Outcome: Progressing  Problem: Coping: Goal: Level of anxiety will decrease 12/25/2022 1928 by Paulla Fore, RN Outcome: Progressing 12/25/2022 1928 by Paulla Fore, RN Outcome: Progressing   Problem: Elimination: Goal: Will not experience complications related to bowel motility 12/25/2022 1928 by Paulla Fore, RN Outcome: Progressing 12/25/2022 1928 by Paulla Fore, RN Outcome: Progressing Goal: Will not experience complications related to urinary retention 12/25/2022 1928 by Paulla Fore, RN Outcome: Progressing 12/25/2022 1928 by Paulla Fore, RN Outcome: Progressing   Problem: Pain Managment: Goal: General experience of comfort will improve 12/25/2022 1928 by Paulla Fore, RN Outcome: Progressing 12/25/2022 1928 by Paulla Fore, RN Outcome: Progressing   Problem: Safety: Goal: Ability to remain free from injury will improve 12/25/2022 1928 by Paulla Fore, RN Outcome: Progressing 12/25/2022 1928 by Paulla Fore, RN Outcome: Progressing   Problem: Skin Integrity: Goal: Risk for impaired skin integrity will decrease 12/25/2022 1928 by Paulla Fore, RN Outcome: Progressing 12/25/2022 1928 by Paulla Fore, RN Outcome: Progressing

## 2022-12-25 NOTE — ED Provider Notes (Signed)
[place] EMERGENCY DEPARTMENT AT [facility] Provider Note   CSN: [id number] Arrival date & time: 12/24/22  2331     History {Add pertinent medical, surgical, social history, OB history to HPI:1} Chief Complaint  Patient presents with   Hyperglycemia   Emesis    Barry Pittman is a 37 y.o. male.  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 States he woke up this AM with generlized weakness; "just wanted to go back to bed". He realized his insulin pump was out of supplies. He is unsure of how long he had gone w/o insulin. Began experiencing N/V at 1300. Has had 7 episodes of NB/NB emesis since onset. Reports administering 30 units of Humalog around 1500 today. Is currently followed by endocrinology in [place]. Had admission at [facility] from 8/24/-8/28 for DKA.   Hyperglycemia Associated symptoms: vomiting   Emesis      Home Medications Prior to Admission medications   Medication Sig Start Date End Date Taking? Authorizing Provider  amoxicillin (AMOXIL) 500 MG tablet Take 500 mg by mouth every 8 (eight) hours. 12/07/22  Yes [provider]  carvedilol (COREG) 6.25 MG tablet Take 6.25 mg by mouth 2 (two) times daily with a meal. 12/14/22 12/14/23 Yes [provider]  chlorhexidine (PERIDEX) 0.12 % solution 2 (two) times daily. 12/07/22  Yes [provider]  ibuprofen (ADVIL) 800 MG tablet Take 800 mg by mouth every 8 (eight) hours as needed. 12/07/22  Yes [provider]  aluminum-magnesium hydroxide-simethicone (MAALOX) 200-200-20 MG/5ML SUSP Take 30 mLs by mouth 3 (three) times daily between meals as needed. 11/28/17   Briant Cedar, MD  blood glucose meter kit and supplies KIT Dispense based on patient and insurance preference. Use up to four times daily as directed. (FOR ICD-9 250.00, 250.01). 11/23/17   Darlin Drop, DO  folic acid (FOLVITE)  1 MG tablet Take 1 tablet (1 mg total) by mouth daily. 11/23/17   Darlin Drop, DO  glucagon (GLUCAGON EMERGENCY) 1 MG injection Inject 1 mg into the muscle daily as needed for up to 1 dose (low blood sugar). 12/25/17   Carlus Pavlov, MD  HUMALOG 100 UNIT/ML injection INJECT 86 UNITS SUBCUTANEOUSLY OVER 24 HOURS VIA PUMP 07/09/18   Carlus Pavlov, MD  insulin glargine (LANTUS) 100 UNIT/ML injection Inject 0.5 mLs (50 Units total) into the skin daily. 11/23/17   Darlin Drop, DO  Lancets (ACCU-CHEK SOFT TOUCH) lancets Use as directed. 11/23/17   Darlin Drop, DO  lisinopril (PRINIVIL,ZESTRIL) 20 MG tablet TAKE 1 TABLET BY MOUTH EVERY DAY 02/26/18   Carlus Pavlov, MD  Multiple Vitamin (MULTIVITAMIN WITH MINERALS) TABS tablet Take 1 tablet by mouth daily. 11/23/17   Darlin Drop, DO  pantoprazole (PROTONIX) 40 MG tablet Take 1 tablet (40 mg total) by mouth 2 (two) times daily before a meal. 11/28/17 12/28/17  Briant Cedar, MD  thiamine 100 MG tablet Take 1 tablet (100 mg total) by mouth daily. 11/23/17   Darlin Drop, DO      Allergies    Novolog [insulin aspart]    Review of Systems   Review of Systems  Gastrointestinal:  Positive for vomiting.    Physical Exam Updated Vital Signs BP (!) 196/86   Pulse (!) 118   Temp 98.1 F (36.7 C) (Oral)   Resp 20   SpO2 100%  Physical Exam  ED Results / Procedures / Treatments   Labs (all labs ordered are listed, but only abnormal results are displayed) Labs Reviewed  CBG MONITORING, ED - Abnormal; Notable for the following components:      Result Value   Glucose-Capillary 575 (*)    All other components within normal limits  BLOOD GAS, VENOUS  BETA-HYDROXYBUTYRIC ACID  CBC WITH DIFFERENTIAL/PLATELET  BASIC METABOLIC PANEL  URINALYSIS, ROUTINE W REFLEX MICROSCOPIC    EKG None  Radiology No results found.  Procedures .Critical Care  Performed by: Antony Madura, PA-C Authorized by: Antony Madura, PA-C   Critical care  provider statement:    Critical care time (minutes):  45   Critical care time was exclusive of:  Separately billable procedures and treating other patients   Critical care was necessary to treat or prevent imminent or life-threatening deterioration of the following conditions:  Endocrine crisis (DKA)   Critical care was time spent personally by me on the following activities:  Development of treatment plan with patient or surrogate, discussions with consultants, evaluation of patient's response to treatment, examination of patient, ordering and review of laboratory studies, ordering and review of radiographic studies, ordering and performing treatments and interventions, pulse oximetry, re-evaluation of patient's condition, review of old charts and obtaining history from patient or surrogate   I assumed direction of critical care for this patient from another provider in my specialty: no     Care discussed with: admitting provider     {Document cardiac monitor, telemetry assessment procedure when appropriate:1}  Medications Ordered in ED Medications  sodium chloride 0.9 % bolus 1,000 mL (has no administration in time range)  pantoprazole (PROTONIX) injection 40 mg (has no administration in time range)  famotidine (PEPCID) IVPB 20 mg premix (has no administration in time range)  ondansetron (ZOFRAN) injection 4 mg (has no administration in time range)  insulin aspart (novoLOG) injection 10 Units (has no administration in time range)    ED Course/ Medical Decision Making/ A&P   {   Click here for ABCD2, HEART and other calculatorsREFRESH Note before signing :1}                              Medical Decision Making Amount and/or Complexity of Data Reviewed Labs: ordered.  Risk Prescription drug management.   ***  {Document critical care time when appropriate:1} {Document review of labs and clinical decision tools ie heart score, Chads2Vasc2 etc:1}  {Document your independent review of  radiology images, and any outside records:1} {Document your discussion with family members, caretakers, and with consultants:1} {Document social determinants of health affecting pt's care:1} {Document your decision making why or why not admission, treatments were needed:1} Final Clinical Impression(s) / ED Diagnoses Final diagnoses:  None    Rx / DC Orders ED Discharge Orders     None

## 2022-12-25 NOTE — Progress Notes (Signed)
Pt reports allergy to Novolog insulin, states that he doesn't know what the reaction he had was because "it was so long ago". Pt reports that he takes Humalog. Pharmacy notified and Humalog is not available. Notified Dr. Carma Leaven, attending physician, states that pt has received Novolog in the ER since admission and to administer medication as that no adverse reactions were noted.

## 2022-12-26 DIAGNOSIS — E1110 Type 2 diabetes mellitus with ketoacidosis without coma: Secondary | ICD-10-CM | POA: Diagnosis present

## 2022-12-26 DIAGNOSIS — N179 Acute kidney failure, unspecified: Secondary | ICD-10-CM

## 2022-12-26 DIAGNOSIS — R7989 Other specified abnormal findings of blood chemistry: Secondary | ICD-10-CM

## 2022-12-26 LAB — BASIC METABOLIC PANEL
Anion gap: 13 (ref 5–15)
BUN: 15 mg/dL (ref 6–20)
CO2: 23 mmol/L (ref 22–32)
Calcium: 8.7 mg/dL — ABNORMAL LOW (ref 8.9–10.3)
Chloride: 98 mmol/L (ref 98–111)
Creatinine, Ser: 0.88 mg/dL (ref 0.61–1.24)
GFR, Estimated: >60 mL/min (ref >60)
Glucose, Bld: 129 mg/dL — ABNORMAL HIGH (ref 70–99)
Potassium: 3.5 mmol/L (ref 3.5–5.1)
Sodium: 134 mmol/L — ABNORMAL LOW (ref 135–145)

## 2022-12-26 LAB — GLUCOSE, CAPILLARY
Glucose-Capillary: 112 mg/dL — ABNORMAL HIGH (ref 70–99)
Glucose-Capillary: 119 mg/dL — ABNORMAL HIGH (ref 70–99)
Glucose-Capillary: 129 mg/dL — ABNORMAL HIGH (ref 70–99)
Glucose-Capillary: 130 mg/dL — ABNORMAL HIGH (ref 70–99)
Glucose-Capillary: 157 mg/dL — ABNORMAL HIGH (ref 70–99)
Glucose-Capillary: 68 mg/dL — ABNORMAL LOW (ref 70–99)
Glucose-Capillary: 91 mg/dL (ref 70–99)
Glucose-Capillary: 96 mg/dL (ref 70–99)

## 2022-12-26 LAB — LIPASE, BLOOD: Lipase: 19 U/L (ref 11–51)

## 2022-12-26 MED ORDER — DEXTROSE 50 % IV SOLN
12.5000 g | INTRAVENOUS | Status: AC
  Administered 2022-12-26: 12.5 g via INTRAVENOUS
  Filled 2022-12-26: qty 50

## 2022-12-26 MED ORDER — CALCIUM CARBONATE ANTACID 500 MG PO CHEW
1.0000 | CHEWABLE_TABLET | Freq: Three times a day (TID) | ORAL | Status: DC
  Administered 2022-12-26 – 2022-12-27 (×3): 200 mg via ORAL
  Filled 2022-12-26 (×3): qty 1

## 2022-12-26 MED ORDER — INSULIN GLARGINE-YFGN 100 UNIT/ML ~~LOC~~ SOLN
40.0000 [IU] | Freq: Every day | SUBCUTANEOUS | Status: DC
  Administered 2022-12-26 – 2022-12-27 (×2): 40 [IU] via SUBCUTANEOUS
  Filled 2022-12-26 (×2): qty 0.4

## 2022-12-26 MED ORDER — TRAZODONE HCL 50 MG PO TABS: 25.0000 mg | ORAL_TABLET | Freq: Once | ORAL | Status: DC

## 2022-12-26 MED ORDER — CARVEDILOL 12.5 MG PO TABS
12.5000 mg | ORAL_TABLET | Freq: Two times a day (BID) | ORAL | Status: DC
  Administered 2022-12-27: 12.5 mg via ORAL
  Filled 2022-12-26: qty 1

## 2022-12-26 MED ORDER — LACTATED RINGERS IV SOLN: INTRAVENOUS | Status: DC

## 2022-12-26 MED ORDER — CALCIUM CARBONATE ANTACID 500 MG PO CHEW: 1.0000 | CHEWABLE_TABLET | Freq: Three times a day (TID) | ORAL | 0 refills | Status: DC

## 2022-12-26 MED ORDER — TRAZODONE HCL 50 MG PO TABS
25.0000 mg | ORAL_TABLET | Freq: Once | ORAL | Status: AC
  Administered 2022-12-26: 25 mg via ORAL
  Filled 2022-12-26: qty 1

## 2022-12-26 MED ORDER — PANTOPRAZOLE SODIUM 40 MG IV SOLR
40.0000 mg | Freq: Two times a day (BID) | INTRAVENOUS | Status: DC
  Administered 2022-12-26 – 2022-12-27 (×2): 40 mg via INTRAVENOUS
  Filled 2022-12-26 (×2): qty 10

## 2022-12-26 MED ORDER — TRAMADOL HCL 50 MG PO TABS
25.0000 mg | ORAL_TABLET | Freq: Once | ORAL | Status: AC
  Administered 2022-12-26: 25 mg via ORAL
  Filled 2022-12-26: qty 1

## 2022-12-26 NOTE — TOC Initial Note (Signed)
Transition of Care [facility]) - Initial/Assessment Note    Patient Details  Name: Barry Pittman MRN: [id number] Date of Birth: Dec 06, 1985  [facility]) CM/SW Contact:    Beckie Busing, RN Phone Number:[phone]  12/26/2022, 3:32 PM  Clinical Narrative:                 TOC following for consult stating : "Pt told Diabetes Coordinator RN this AM that he lost his job and insurance.  He is on the wait list at [facility] Endocrinology.  Not sure what his finances are like?  May not be able to afford insulin when he goes home?  Ran out of pump supplies.  Can we help him with follow up care and meds when he goes home?  Thanks!"  Cm at bedside for assessment. Patient states that he is in the process of moving to [place].  Patient gives consent for TOC to set up appointment to establish care at one of the clinics. Cm is able to initiate a MATCH approval for 30 day supply of meds prior to discharge. TOC does not have any medication assist beyond 30 day supply. TOC will continue to follow for disposition needs  Expected Discharge Plan: Home/Self Care Barriers to Discharge: Continued Medical Work up   Patient Goals and CMS Choice Patient states their goals for this hospitalization and ongoing recovery are:: Wants to get better to be able to go home CMS Medicare.gov Compare Post Acute Care list provided to::  (n/a) Choice offered to / list presented to : NA [place] ownership interest in [facility].provided to::  (n/a)    Expected Discharge Plan and Services In-house Referral: NA Discharge Planning Services: CM Consult Post Acute Care Choice: NA Living arrangements for the past 2 months: Apartment                 DME Arranged: N/A DME Agency: NA       HH Arranged: NA HH Agency: NA        Prior Living Arrangements/Services Living arrangements for the past 2 months: Apartment Lives with:: Self Patient language and need for interpreter reviewed:: Yes Do you  feel safe going back to the place where you live?: Yes      Need for Family Participation in Patient Care: No (Comment) Care giver support system in place?: Yes (comment) Current home services: Other (comment) Criminal Activity/Legal Involvement Pertinent to Current Situation/Hospitalization: No - Comment as needed  Activities of Daily Living Home Assistive Devices/Equipment: None ADL Screening (condition at time of admission) Patient's cognitive ability adequate to safely complete daily activities?: Yes Is the patient deaf or have difficulty hearing?: No Does the patient have difficulty seeing, even when wearing glasses/contacts?: No Does the patient have difficulty concentrating, remembering, or making decisions?: No Patient able to express need for assistance with ADLs?: Yes Does the patient have difficulty dressing or bathing?: No Independently performs ADLs?: Yes (appropriate for developmental age) Does the patient have difficulty walking or climbing stairs?: No Weakness of Legs: None Weakness of Arms/Hands: None  Permission Sought/Granted Permission sought to share information with : Case Manager, Family Supports Permission granted to share information with : No              Emotional Assessment Appearance:: Appears stated age Attitude/Demeanor/Rapport: Guarded Affect (typically observed): Flat, Guarded Orientation: : Oriented to Self, Oriented to Place, Oriented to  Time, Oriented to Situation Alcohol / Substance Use: Not Applicable Psych Involvement: No (comment)  Admission diagnosis:  DKA, 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] Diabetic ketoacidosis without coma associated with type 1 diabetes mellitus (HCC) [E10.10] DKA (diabetic ketoacidosis) (HCC) [E11.10] Patient Active Problem List   Diagnosis Date Noted   DKA (diabetic ketoacidosis) (HCC) 12/26/2022   Hypertension 11/26/2017   Chest pain 09/22/2016   DKA, type 1 (HCC) 09/22/2016   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   Hyperkalemia 05/27/2015   Hyponatremia 05/27/2015   Acute kidney injury (HCC) 05/27/2015   Thrombocytosis 05/27/2015   Leukocytosis 05/27/2015   Intractable nausea and vomiting 05/27/2015   Acute respiratory failure with hypoxia (HCC) 05/27/2015   DKA (diabetic ketoacidoses) 05/26/2015   PCP:  Patient, No Pcp Per Pharmacy:   [id number] [place], [place] - [address] AT [facility] [address] [place] [address] Phone: [phone] Fax: [phone]     Social Determinants of Health (SDOH) Social History: SDOH Screenings   Food Insecurity: No Food Insecurity (12/25/2022)  Housing: Low Risk  (12/25/2022)  Transportation Needs: No Transportation Needs (12/25/2022)  Utilities: Not At Risk (12/25/2022)  Social Connections: Moderately Isolated (12/26/2022)  Tobacco Use: Low Risk  (12/24/2022)  Recent Concern: Tobacco Use - High Risk (11/05/2022)   Received from [facility] Literacy: Adequate Health Literacy (12/26/2022)   SDOH Interventions: Social Connections Interventions: Intervention Not Indicated Health Literacy Interventions: Intervention Not Indicated   Readmission Risk Interventions    12/26/2022    3:14 PM  Readmission Risk Prevention Plan  Post Dischage Appt Complete  Medication Screening Complete  Transportation Screening Complete

## 2022-12-26 NOTE — Progress Notes (Signed)
PROGRESS NOTE    LORNA STAHLBERG  UJW:[id number] DOB: [birth year] DOA: 12/24/2022 PCP: Patient, No Pcp Per   Brief Narrative:  LOGANN FLAM is a 37 y.o. male who reports profound noncompliance despite history of type 1 diabetes on insulin pump, hypertension with multiple hospitalizations over the past year, most recently 8/24 through 8/28.  Patient indicates he has had a "stressful" year with multiple social stressors including divorce as well as recently losing his job and subsequently his insurance.  Patient indicates he has not been consistent with his home insulin due to cost and insurance issues.   Patient has been transitioned off DKA protocol given closed anion gap and tolerating PO with long acting insulin ongoing. Unfortunately patient's nausea and vomiting continue to be poorly controlled requiring IV medications to control symptoms and is now requiring IV fluids to maintain appropriate volume status.   Hopeful patient will be able to tolerate PO more appropriately later today/tomorrow for planned discharge.   Assessment & Plan:   Principal Problem:   DKA, type 1 (HCC) Active Problems:   DKA (diabetic ketoacidosis) (HCC)   DKA in the setting of noncompliance, without coma Diabetes type 1, uncontrolled with hyperglycemia -Patient reports running out of DM supplies -DKA resolved, anion gap closed -Continue long acting insulin -A1c 13.1 - correlates to an average glucose of 320.  Anion Gap metabolic acidosis Intractable nausea/vomiting/epigastric pain Pseudohyponatremia Hypochloremia AKI, resolved Leukocytosis, reactive Hypertension, exacerbated by above - Secondary to above, improving with treatment as above   DVT prophylaxis: enoxaparin (LOVENOX) injection 40 mg Start: 12/25/22 1000   Code Status:   Code Status: Full Code  Family Communication: None present  Status is: Inpt  Dispo: The patient is from: Home              Anticipated d/c is to:  Home              Anticipated d/c date is: 24-48h              Patient currently note medically stable for discharge - still requiring IV fluids and nausea medications  Consultants:  None  Procedures:  None  Antimicrobials:  None   Subjective: No acute issues/events overnight - symptoms improving but not resolved. Epigastric burning pain today. Denies headache, fevers, chills, diarrhea, or constipation.  Objective: Vitals:   12/26/22 0016 12/26/22 0415 12/26/22 0858 12/26/22 1400  BP: 107/62 126/73 137/78 (!) 159/78  Pulse: 96 90 (!) 104 100  Resp: 16 16 18 20   Temp: 97.8 F (36.6 C) 98 F (36.7 C) 98.2 F (36.8 C) 98.7 F (37.1 C)  TempSrc: Oral Oral Oral Oral  SpO2: 96% 97% 99% 98%  Weight:      Height:        Intake/Output Summary (Last 24 hours) at 12/26/2022 1637 Last data filed at 12/26/2022 1100 Gross per 24 hour  Intake 600 ml  Output --  Net 600 ml   Filed Weights   12/25/22 0216  Weight: 86.2 kg    Examination:  General exam: Appears calm and comfortable  Respiratory system: Clear to auscultation. Respiratory effort normal. Cardiovascular system: S1 & S2 heard, RRR. No JVD, murmurs, rubs, gallops or clicks. No pedal edema. Gastrointestinal system: Abdomen is nondistended, soft and nontender. No organomegaly or masses felt. Normal bowel sounds heard. Central nervous system: Alert and oriented. No focal neurological deficits. Extremities: Symmetric 5 x 5 power. Skin: No rashes, lesions or ulcers  Data Reviewed: I have personally reviewed  following labs and imaging studies  CBC: Recent Labs  Lab 12/25/22 0000 12/25/22 0455  WBC 26.8* 28.9*  NEUTROABS 23.7*  --   HGB 14.9 14.6  HCT 46.1 43.9  MCV 89.5 87.5  PLT 537* 494*   Basic Metabolic Panel: Recent Labs  Lab 12/25/22 0455 12/25/22 0800 12/25/22 1255 12/25/22 1711 12/26/22 0828  NA 136 131* 132* 130* 134*  K 4.3 4.1 3.9 4.5 3.5  CL 104 102 102 96* 98  CO2 12* 16* 16* 14* 23   GLUCOSE 317* 221* 272* 286* 129*  BUN 24* 23* 20 19 15   CREATININE 1.35* 1.09 0.92 1.07 0.88  CALCIUM 9.2 8.3* 8.5* 9.0 8.7*  MG 2.1  --   --   --   --   PHOS 4.0  --   --   --   --    GFR: Estimated Creatinine Clearance: 122.4 mL/min (by C-G formula based on SCr of 0.88 mg/dL).  Recent Labs  Lab 12/26/22 0828  LIPASE 19   HbA1C: Recent Labs    12/25/22 0227  HGBA1C 13.1*   CBG: Recent Labs  Lab 12/26/22 0109 12/26/22 0416 12/26/22 0752 12/26/22 1133 12/26/22 1628  GLUCAP 129* 130* 112* 157* 119*    No results found for this or any previous visit (from the past 240 hour(s)).   Radiology Studies: No results found.  Scheduled Meds:  calcium carbonate  1 tablet Oral TID   carvedilol  6.25 mg Oral BID WC   enoxaparin (LOVENOX) injection  40 mg Subcutaneous Q24H   insulin aspart  0-20 Units Subcutaneous Q4H   insulin glargine-yfgn  40 Units Subcutaneous Daily   lisinopril  20 mg Oral Daily   pantoprazole (PROTONIX) IV  40 mg Intravenous Q12H   Continuous Infusions:  lactated ringers 125 mL/hr at 12/26/22 1308     LOS: 1 day   Time spent:  Azucena Fallen, DO [facility]  If 7PM-7AM, please contact night-coverage [email or web address]  12/26/2022, 4:37 PM

## 2022-12-26 NOTE — Plan of Care (Addendum)
Alert and oriented. Nausea meds given. Poor PO intake. MD notified.

## 2022-12-26 NOTE — Inpatient Diabetes Management (Addendum)
Inpatient Diabetes Program Recommendations  AACE/ADA: New Consensus Statement on Inpatient Glycemic Control (2015)  Target Ranges:  Prepandial:   less than 140 mg/dL      Peak postprandial:   less than 180 mg/dL (1-2 hours)      Critically ill patients:  140 - 180 mg/dL    Latest Reference Range & Units 12/25/22 00:00  Glucose 70 - 99 mg/dL 536 (HH)  (HH): Data is critically high  Latest Reference Range & Units 12/25/22 02:27  Hemoglobin A1C 4.8 - 5.6 % 13.1 (H)  329 mg/dl  (H): Data is abnormally high  Latest Reference Range & Units 12/25/22 11:58 12/25/22 13:24 12/25/22 14:33 12/25/22 17:16 12/25/22 20:17 12/26/22 00:15 12/26/22 01:09 12/26/22 04:16  Glucose-Capillary 70 - 99 mg/dL 644 (H) 034 (H) 742 (H)  IV Insulin Drip Stopped @1513   40 units Semglee @1534  231 (H)  20 units Novolog @1900  260 (H)  11 units Novolog @2040  68 (L) 129 (H) 130 (H)  3 units Novolog   (H): Data is abnormally high (L): Data is abnormally low   Admit with: DKA "reports profound noncompliance despite history of type 1 diabetes on insulin pump, hypertension with multiple hospitalizations over the past year, most recently 8/24 through 8/28.  Patient indicates he has had a "stressful" year with multiple social stressors including divorce as well as recently losing his job and subsequently his insurance.  Patient indicates he has not been consistent with his home insulin due to cost and insurance issues."    History: Type 1 Diabetes  Home DM Meds: Insulin Pump  Current Orders: Novolog Resistant Correction Scale/ SSI (0-20 units) Q4 hours     MD- Note pt transitioned to SQ Insulin yesterday afternoon.  BMET from 17:11 last PM showed Anion Gap elevated to 20 and CO2 down to 14 No BMET drawn this AM  Was given 40 units Semglee insulin to transition yest around 3pm  Please consider adding BMET to AM labs  If pt does not resume home insulin pump today, will need Semglee insulin later  today   Addendum 10:30am--Met w/ pt at bedside this AM.  Pt had his eyes closed and told me he was not trying to be rude but that he was having extreme nausea and keeping his eyes closed helped a little--Per pt, RN had medicated him for nausea about an hour ago.  Note BMET added on to labs this AM--BMET drawn this AM showed Anion Gap 13 and CO2 level 23.  Notified RN of pt's continued Nausea and per Progress notes, RN notified MD of pt's nausea this AM.  Pt told me he is in the process of moving to [place] from [facility].  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  He is out of infusion sets for his insulin pump.  Suspect this is the cause for his DKA.  Used to see Dr. Elvera Lennox for Pump management back in 2019. Pt told me he called Dr. Charlean Sanfilippo office for an appt but got put on the wait list--Does not have PCP in the area.  I have consulted TOC team for this issue and also since pt does not currently have insurance.  Pt could not recall for me how much basal insulin he gets on his pump in a 24 hour period, also cannot remember the correction factor he uses nor the carb ratio.  Pump is at home so I cannot review the pump to check any settings.  Explained to pt that we transitioned  him to basal/bolus SQ regimen yest and will adjust insulin daily as needed.  Explained to pt that we do not have pump supplies to help him when he goes home. Explained to pt that he will likely need to switch to basal/bolus SQ Insulin regimen when he goes home until he can follow up with a local ENDO.  Alerted RN to all of the above info.    --Will follow patient during hospitalization--  Ambrose Finland RN, MSN, CDCES Diabetes Coordinator Inpatient Glycemic Control Team Team Pager: [phone] (8a-5p)

## 2022-12-27 ENCOUNTER — Other Ambulatory Visit (HOSPITAL_COMMUNITY): Payer: Self-pay

## 2022-12-27 LAB — CBC
HCT: 34.4 % — ABNORMAL LOW (ref 39.0–52.0)
Hemoglobin: 11.8 g/dL — ABNORMAL LOW (ref 13.0–17.0)
MCH: 29.6 pg (ref 26.0–34.0)
MCHC: 34.3 g/dL (ref 30.0–36.0)
MCV: 86.2 fL (ref 80.0–100.0)
Platelets: 297 10*3/uL (ref 150–400)
RBC: 3.99 MIL/uL — ABNORMAL LOW (ref 4.22–5.81)
RDW: 12.8 % (ref 11.5–15.5)
WBC: 9.9 10*3/uL (ref 4.0–10.5)
nRBC: 0 % (ref 0.0–0.2)

## 2022-12-27 LAB — COMPREHENSIVE METABOLIC PANEL
ALT: 8 U/L (ref 0–44)
AST: 13 U/L — ABNORMAL LOW (ref 15–41)
Albumin: 2.8 g/dL — ABNORMAL LOW (ref 3.5–5.0)
Alkaline Phosphatase: 57 U/L (ref 38–126)
Anion gap: 10 (ref 5–15)
BUN: 8 mg/dL (ref 6–20)
CO2: 26 mmol/L (ref 22–32)
Calcium: 8.5 mg/dL — ABNORMAL LOW (ref 8.9–10.3)
Chloride: 98 mmol/L (ref 98–111)
Creatinine, Ser: 0.71 mg/dL (ref 0.61–1.24)
GFR, Estimated: >60 mL/min (ref >60)
Glucose, Bld: 133 mg/dL — ABNORMAL HIGH (ref 70–99)
Potassium: 3 mmol/L — ABNORMAL LOW (ref 3.5–5.1)
Sodium: 134 mmol/L — ABNORMAL LOW (ref 135–145)
Total Bilirubin: 0.9 mg/dL (ref 0.3–1.2)
Total Protein: 5.9 g/dL — ABNORMAL LOW (ref 6.5–8.1)

## 2022-12-27 LAB — GLUCOSE, CAPILLARY
Glucose-Capillary: 120 mg/dL — ABNORMAL HIGH (ref 70–99)
Glucose-Capillary: 194 mg/dL — ABNORMAL HIGH (ref 70–99)
Glucose-Capillary: 197 mg/dL — ABNORMAL HIGH (ref 70–99)

## 2022-12-27 MED ORDER — ONDANSETRON HCL 4 MG PO TABS
4.0000 mg | ORAL_TABLET | Freq: Four times a day (QID) | ORAL | 0 refills | Status: DC | PRN
  Filled 2022-12-27: qty 20, 5d supply, fill #0

## 2022-12-27 MED ORDER — POTASSIUM CHLORIDE 20 MEQ PO PACK
80.0000 meq | PACK | Freq: Once | ORAL | Status: AC
  Administered 2022-12-27: 80 meq via ORAL
  Filled 2022-12-27: qty 4

## 2022-12-27 MED ORDER — INSULIN GLARGINE 100 UNIT/ML ~~LOC~~ SOLN: 40.0000 [IU] | Freq: Every day | SUBCUTANEOUS | 0 refills | Status: DC

## 2022-12-27 MED ORDER — LISINOPRIL 20 MG PO TABS
20.0000 mg | ORAL_TABLET | Freq: Every day | ORAL | 0 refills | Status: DC
  Filled 2022-12-27: qty 30, 30d supply, fill #0

## 2022-12-27 MED ORDER — HUMALOG 100 UNIT/ML ~~LOC~~ SOLN: 86.0000 [IU] | SUBCUTANEOUS | 2 refills | Status: DC

## 2022-12-27 MED ORDER — BLOOD GLUCOSE MONITOR KIT: PACK | 0 refills | Status: AC

## 2022-12-27 MED ORDER — GVOKE HYPOPEN 1-PACK 1 MG/0.2ML ~~LOC~~ SOAJ
1.0000 mg | Freq: Every day | SUBCUTANEOUS | 4 refills | Status: AC | PRN
Start: 2022-12-27 — End: ?
  Filled 2022-12-27 (×2): qty 0.2, 30d supply, fill #0

## 2022-12-27 MED ORDER — POTASSIUM CHLORIDE CRYS ER 20 MEQ PO TBCR
40.0000 meq | EXTENDED_RELEASE_TABLET | ORAL | Status: DC
  Administered 2022-12-27: 40 meq via ORAL
  Filled 2022-12-27: qty 2

## 2022-12-27 MED ORDER — ACCU-CHEK SOFT TOUCH LANCETS MISC: 0 refills | Status: AC

## 2022-12-27 MED ORDER — CARVEDILOL 3.125 MG PO TABS
3.1250 mg | ORAL_TABLET | Freq: Two times a day (BID) | ORAL | 0 refills | Status: DC
  Filled 2022-12-27: qty 60, 30d supply, fill #0

## 2022-12-27 MED ORDER — INSULIN PUMP ACCESSORIES MISC: 1.0000 | Freq: Every day | 0 refills | Status: AC

## 2022-12-27 NOTE — Progress Notes (Signed)
AVS reviewed w/ pt  who verbalized an understanding - pt to d/c lounge to wait on meds for home. No other questions at this time

## 2022-12-27 NOTE — Progress Notes (Addendum)
MATCH MEDICATION ASSISTANCE CARD Pharmacies please call [phone] for claim processing assistance.  Rx BIN: [id number] Rx Group: [id number] Rx PCN: [id number] Relationship Code: 1 Person Code: 01  Patient ID (MRN): [id number]    Patient Name:Barry Pittman   Patient DOB:[birth year]   Discharge Date: 12/26/22  Expiration Date: 01/04/23(must be filled within 7 days of discharge)   Dear Barry Pittman have been approved to have the prescriptions written by your discharging physician filled through our [facility] (Medication Assistance Through [facility]) program. This program allows for a one-time (no refills) 34-day supply of selected medications for a low copay amount.  The copay is $3.00 per prescription. For instance, if you have one prescription, you will pay $3.00; for two prescriptions, you pay $6.00; for three prescriptions, you pay $9.00; and so on. Only certain pharmacies are participating in this program with [facility]. You will need to select one of the pharmacies from the attached lists and take your prescriptions, this letter, and your photo ID to one of the participating pharmacies.  We are excited that you are able to use the [facility] program to get your medications. These prescriptions must be filled within 7 days of hospital discharge or they will no longer be valid for the [facility] program. Should you have any problems with your prescriptions please contact your case management team member at [phone] for Barry Pittman [place] or [phone] for [facility].  Thank you, [facility]

## 2022-12-27 NOTE — Plan of Care (Signed)
Alert and oriented x 4. Free of falls and other injuries. Vital signs are stable. Awaiting discharge.    Problem: Coping: Goal: Ability to adjust to condition or change in health will improve Outcome: Adequate for Discharge   Problem: Fluid Volume: Goal: Ability to maintain a balanced intake and output will improve Outcome: Adequate for Discharge   Problem: Health Behavior/Discharge Planning: Goal: Ability to identify and utilize available resources and services will improve Outcome: Adequate for Discharge

## 2022-12-27 NOTE — Discharge Summary (Signed)
Physician Discharge Summary  Barry Pittman RJJ:[id number] DOB: May 31, 1985 DOA: 12/24/2022  PCP: Patient, No Pcp Per  Admit date: 12/24/2022 Discharge date: 12/27/2022  Admitted From: Home Disposition: Home  Recommendations for Outpatient Follow-up:  Follow up with PCP in 1-2 weeks Follow-up with pharmacy in regards to diabetic equipment:  Home Health: None Equipment/Devices: Diabetic equipment  Discharge Condition: Stable CODE STATUS: Full Diet recommendation: Diabetic diet  Brief/Interim Summary: Barry Pittman is a 37 y.o. male who reports profound noncompliance despite history of type 1 diabetes on insulin pump, hypertension with multiple hospitalizations over the past year, most recently 8/24 through 8/28.  Patient indicates he has had a "stressful" year with multiple social stressors including divorce as well as recently losing his job and subsequently his insurance.  Patient indicates he has not been consistent with his home insulin due to cost and insurance issues.   Patient has been transitioned off DKA protocol given closed anion gap and tolerating PO with long acting insulin ongoing. Unfortunately patient's nausea and vomiting continue to be poorly controlled requiring IV medications to control symptoms and is now requiring IV fluids to maintain appropriate volume status.   Patient not tolerating p.o. quite well, symptoms resolving denies further nausea vomiting diarrhea constipation headache fevers chills chest pain or abdominal pain.  Discharging patient with new diabetic event, meter as well as insulin as below.  Discussed with patient given cost issues will prescribe both long-acting insulin pen as well as insulin pump supplies, he is to take only one of these insulins he understands that he should not be taking both of these insulins at the same time.    Discharge Diagnoses:  Principal Problem:   DKA, type 1 (HCC) Active Problems:   DKA (diabetic ketoacidosis)  Barry Pittman)    Discharge Instructions  Discharge Instructions     Discharge patient   Complete by: As directed    Discharge disposition: 01-Home or Self Care   Discharge patient date: 12/27/2022      Allergies as of 12/27/2022       Reactions   Novolog [insulin Aspart] Other (See Comments)   "goes into DKA"        Medication List     STOP taking these medications    aluminum-magnesium hydroxide-simethicone 200-200-20 MG/5ML Susp Commonly known as: MAALOX   amoxicillin 500 MG tablet Commonly known as: AMOXIL   chlorhexidine 0.12 % solution Commonly known as: PERIDEX   folic acid 1 MG tablet Commonly known as: FOLVITE   ibuprofen 800 MG tablet Commonly known as: ADVIL   multivitamin with minerals Tabs tablet   pantoprazole 40 MG tablet Commonly known as: PROTONIX   thiamine 100 MG tablet Commonly known as: VITAMIN B1       TAKE these medications    accu-chek soft touch lancets Use as directed.   blood glucose meter kit and supplies Kit Dispense based on patient and insurance preference. Use up to four times daily as directed. (FOR ICD-9 250.00, 250.01).   carvedilol 3.125 MG tablet Commonly known as: COREG Take 1 tablet (3.125 mg total) by mouth 2 (two) times daily with a meal. What changed:  medication strength how much to take   glucagon 1 MG injection Inject 1 mg into the muscle daily as needed for up to 1 dose (low blood sugar).   HumaLOG 100 UNIT/ML injection Generic drug: insulin lispro Inject 0.86 mLs (86 Units total) into the skin as directed. VIA PUMP What changed: See the new instructions.  insulin glargine 100 UNIT/ML injection Commonly known as: LANTUS Inject 0.4 mLs (40 Units total) into the skin daily. Take this daily OR use the insulin pump. DO NOT USE BOTH SIMULTANEOUSLY. What changed:  how much to take additional instructions   Insulin Pump Accessories Misc 1 each by Does not apply route daily. Patient requires Mini-Med  Infusion set   insulin pump Soln Inject 1 each into the skin as directed.   lisinopril 20 MG tablet Commonly known as: ZESTRIL Take 1 tablet (20 mg total) by mouth daily. Start taking on: December 28, 2022   ondansetron 4 MG tablet Commonly known as: ZOFRAN Take 1 tablet (4 mg total) by mouth every 6 (six) hours as needed for nausea.        Follow-up Information     [facility] Follow up.   Why: You have been scheduled for a Pittman follow up to establish care on September 27,2024 @ 8:45am . You will have a $25 co pay for services. If you have any questions or concerns please call the number listed above. Contact information: [address] [place] [address] [phone]               Allergies  Allergen Reactions   Novolog [Insulin Aspart] Other (See Comments)    "goes into DKA"    Consultations: None  Procedures/Studies: No results found.   Subjective: No acute issues or events overnight tolerating p.o. quite well denies nausea vomiting diarrhea constipation any fevers chills chest pain   Discharge Exam: Vitals:   12/26/22 2028 12/27/22 0425  BP: (!) 162/93 (!) 147/85  Pulse: 93 84  Resp: 20 20  Temp: 98.5 F (36.9 C) 98.6 F (37 C)  SpO2: 99% 96%   Vitals:   12/26/22 0858 12/26/22 1400 12/26/22 2028 12/27/22 0425  BP: 137/78 (!) 159/78 (!) 162/93 (!) 147/85  Pulse: (!) 104 100 93 84  Resp: 18 20 20 20   Temp: 98.2 F (36.8 C) 98.7 F (37.1 C) 98.5 F (36.9 C) 98.6 F (37 C)  TempSrc: Oral Oral Oral Oral  SpO2: 99% 98% 99% 96%  Weight:      Height:        General: Pt is alert, awake, not in acute distress Cardiovascular: RRR, S1/S2 +, no rubs, no gallops Respiratory: CTA bilaterally, no wheezing, no rhonchi Abdominal: Soft, NT, ND, bowel sounds + Extremities: no edema, no cyanosis    The results of significant diagnostics from this hospitalization (including imaging, microbiology,  ancillary and laboratory) are listed below for reference.     Microbiology: No results found for this or any previous visit (from the past 240 hour(s)).   Labs: BNP (last 3 results) No results for input(s): "BNP" in the last 8760 hours. Basic Metabolic Panel: Recent Labs  Lab 12/25/22 0455 12/25/22 0800 12/25/22 1255 12/25/22 1711 12/26/22 0828 12/27/22 0530  NA 136 131* 132* 130* 134* 134*  K 4.3 4.1 3.9 4.5 3.5 3.0*  CL 104 102 102 96* 98 98  CO2 12* 16* 16* 14* 23 26  GLUCOSE 317* 221* 272* 286* 129* 133*  BUN 24* 23* 20 19 15 8   CREATININE 1.35* 1.09 0.92 1.07 0.88 0.71  CALCIUM 9.2 8.3* 8.5* 9.0 8.7* 8.5*  MG 2.1  --   --   --   --   --   PHOS 4.0  --   --   --   --   --    Liver  Function Tests: Recent Labs  Lab 12/27/22 0530  AST 13*  ALT 8  ALKPHOS 57  BILITOT 0.9  PROT 5.9*  ALBUMIN 2.8*   Recent Labs  Lab 12/26/22 0828  LIPASE 19   No results for input(s): "AMMONIA" in the last 168 hours. CBC: Recent Labs  Lab 12/25/22 0000 12/25/22 0455 12/27/22 0530  WBC 26.8* 28.9* 9.9  NEUTROABS 23.7*  --   --   HGB 14.9 14.6 11.8*  HCT 46.1 43.9 34.4*  MCV 89.5 87.5 86.2  PLT 537* 494* 297   Cardiac Enzymes: No results for input(s): "CKTOTAL", "CKMB", "CKMBINDEX", "TROPONINI" in the last 168 hours. BNP: Invalid input(s): "POCBNP" CBG: Recent Labs  Lab 12/26/22 2027 12/26/22 2355 12/27/22 0423 12/27/22 0750 12/27/22 1207  GLUCAP 96 91 120* 194* 197*   D-Dimer No results for input(s): "DDIMER" in the last 72 hours. Hgb A1c Recent Labs    12/25/22 0227  HGBA1C 13.1*   Lipid Profile No results for input(s): "CHOL", "HDL", "LDLCALC", "TRIG", "CHOLHDL", "LDLDIRECT" in the last 72 hours. Thyroid function studies No results for input(s): "TSH", "T4TOTAL", "T3FREE", "THYROIDAB" in the last 72 hours.  Invalid input(s): "FREET3" Anemia work up No results for input(s): "VITAMINB12", "FOLATE", "FERRITIN", "TIBC", "IRON", "RETICCTPCT" in the last  72 hours. Urinalysis    Component Value Date/Time   COLORURINE YELLOW 12/25/2022 0101   APPEARANCEUR CLEAR 12/25/2022 0101   LABSPEC 1.026 12/25/2022 0101   PHURINE 5.0 12/25/2022 0101   GLUCOSEU >=500 (A) 12/25/2022 0101   HGBUR SMALL (A) 12/25/2022 0101   BILIRUBINUR NEGATIVE 12/25/2022 0101   KETONESUR 80 (A) 12/25/2022 0101   PROTEINUR 30 (A) 12/25/2022 0101   NITRITE NEGATIVE 12/25/2022 0101   LEUKOCYTESUR NEGATIVE 12/25/2022 0101   Sepsis Labs Recent Labs  Lab 12/25/22 0000 12/25/22 0455 12/27/22 0530  WBC 26.8* 28.9* 9.9   Microbiology No results found for this or any previous visit (from the past 240 hour(s)).   Time coordinating discharge: Over 30 minutes  SIGNED:   Azucena Fallen, DO [facility] 12/27/2022, 2:43 PM Pager   If 7PM-7AM, please contact night-coverage [email or web address]

## 2022-12-27 NOTE — TOC Transition Note (Signed)
Transition of Care [facility]) - CM/SW Discharge Note   Patient Details  Name: CASHIUS HOLMEN MRN: [id number] Date of Birth: Jan 12, 1986  Transition of [facility]) CM/SW Contact:  Beckie Busing, RN Phone Number:[phone]  12/27/2022, 1:48 PM   Clinical Narrative:    Uninsured patient with discharge orders. MATCH form place on chart to provide 30 day supply of meds prior to d/c. Message has been sent to pharmacy. Hospital follow up appointment has been schedule and info added to AVS. There are no other TOC needs noted at this time. TOC will sign off.      Barriers to Discharge: Continued Medical Work up   Patient Goals and CMS Choice CMS Medicare.gov Compare Post Acute Care list provided to::  (n/a) Choice offered to / list presented to : NA  Discharge Placement                         Discharge Plan and Services Additional resources added to the After Visit Summary for   In-house Referral: NA Discharge Planning Services: CM Consult Post Acute Care Choice: NA          DME Arranged: N/A DME Agency: NA       HH Arranged: NA HH Agency: NA        Social Determinants of Health (SDOH) Interventions SDOH Screenings   Food Insecurity: No Food Insecurity (12/25/2022)  Housing: Low Risk  (12/25/2022)  Transportation Needs: No Transportation Needs (12/25/2022)  Utilities: Not At Risk (12/25/2022)  Social Connections: Moderately Isolated (12/26/2022)  Tobacco Use: Low Risk  (12/24/2022)  Recent Concern: Tobacco Use - High Risk (11/05/2022)   Received from [facility] [place] [facility] Literacy: Adequate Health Literacy (12/26/2022)     Readmission Risk Interventions    12/26/2022    3:14 PM  Readmission Risk Prevention Plan  Post Dischage Appt Complete  Medication Screening Complete  Transportation Screening Complete

## 2022-12-27 NOTE — Plan of Care (Signed)
  Problem: Education: Goal: Ability to describe self-care measures that may prevent or decrease complications (Diabetes Survival Skills Education) will improve Outcome: Progressing Goal: Individualized Educational Video(s) Outcome: Progressing   Problem: Coping: Goal: Ability to adjust to condition or change in health will improve Outcome: Progressing   Problem: Fluid Volume: Goal: Ability to maintain a balanced intake and output will improve Outcome: Progressing   Problem: Metabolic: Goal: Ability to maintain appropriate glucose levels will improve Outcome: Progressing   Problem: Nutritional: Goal: Maintenance of adequate nutrition will improve Outcome: Progressing   Problem: Skin Integrity: Goal: Risk for impaired skin integrity will decrease Outcome: Progressing   Problem: Tissue Perfusion: Goal: Adequacy of tissue perfusion will improve Outcome: Progressing   Problem: Clinical Measurements: Goal: Will remain free from infection Outcome: Progressing

## 2022-12-27 NOTE — Inpatient Diabetes Management (Addendum)
Inpatient Diabetes Program Recommendations  AACE/ADA: New Consensus Statement on Inpatient Glycemic Control (2015)  Target Ranges:  Prepandial:   less than 140 mg/dL      Peak postprandial:   less than 180 mg/dL (1-2 hours)      Critically ill patients:  140 - 180 mg/dL   Lab Results  Component Value Date   GLUCAP 194 (H) 12/27/2022   HGBA1C 13.1 (H) 12/25/2022     Discharge Recommendations: Other recommendations: He will be given a MATCH letter which will cover his medications for 1 month. Long acting recommendations: Insulin Glargine (LANTUS) Solostar Pen 40 units QD  Short acting recommendations:  Meal + Correction coverage Insulin lispro (HUMALOG) KwikPen  Sensitive Scale.  4 units TID with meals   Use Adult Diabetes Insulin Treatment Post Discharge order set.   Spoke with patient at bedside.  He is in the process of moving to [place] from [facility].  Has lost his insurance and can no longer afford his Medtronic insulin pump supplies.  TOC will be providing him with a MATCH letter which will cover his medications for 1 month.  Looks like he will follow up at a [facility] clinic-hopefully they can assist him with insulins.  He is on the wait list with [facility].    The plan is for him to go back on the pump once he has insurance.  Told him about the [employer] ReliOn Novolin insulins and that they can be purchased OTC without a prescription if he were to run out of insulins.    Will place a [employer] on him prior to DC.  He does not have a glucometer & I do not have one here to provide him with.   He can purchase one at [facility] for $19.99.  It comes with lancets, a lancing device and strips.    Placed FSL3 CGM to the back of his right arm with permission from Dr. Natale Milch.  Gave him an extra sensor.  Educated him on the CGM.    Will continue to follow while inpatient.  Thank you, Dulce Sellar, MSN, CDCES Diabetes Coordinator Inpatient Diabetes Program [phone]  (team pager from 8a-5p)

## 2022-12-27 NOTE — Inpatient Diabetes Management (Signed)
Inpatient Diabetes Program Recommendations  AACE/ADA: New Consensus Statement on Inpatient Glycemic Control (2015)  Target Ranges:  Prepandial:   less than 140 mg/dL      Peak postprandial:   less than 180 mg/dL (1-2 hours)      Critically ill patients:  140 - 180 mg/dL   Lab Results  Component Value Date   GLUCAP 194 (H) 12/27/2022   HGBA1C 13.1 (H) 12/25/2022    Review of Glycemic Control  Latest Reference Range & Units 12/26/22 07:52 12/26/22 11:33 12/26/22 16:28 12/26/22 20:27 12/26/22 23:55 12/27/22 04:23 12/27/22 07:50  Glucose-Capillary 70 - 99 mg/dL 409 (H) 811 (H) 914 (H) 96 91 120 (H) 194 (H)  (H): Data is abnormally high  Current orders for Inpatient glycemic control:  Semglee 40 every day, Novolog 0-20 units Q4H  Inpatient Diabetes Program Recommendations:    Might consider:  70/30 28 units BID (total of 39 units of basal insulin every day and 8.4 units of meal coverage BID).  Will continue to follow while inpatient.  Thank you, Dulce Sellar, MSN, CDCES Diabetes Coordinator Inpatient Diabetes Program [phone] (team pager from 8a-5p)

## 2023-01-13 ENCOUNTER — Ambulatory Visit: Payer: Self-pay | Admitting: Student

## 2023-01-16 ENCOUNTER — Encounter (HOSPITAL_COMMUNITY): Payer: Self-pay | Admitting: *Deleted

## 2023-01-16 ENCOUNTER — Emergency Department (HOSPITAL_COMMUNITY): Payer: Medicaid Other

## 2023-01-16 ENCOUNTER — Inpatient Hospital Stay (HOSPITAL_COMMUNITY)
Admission: EM | Admit: 2023-01-16 | Discharge: 2023-01-18 | DRG: 638 | Disposition: A | Discharge status: Home / Self Care | Payer: Medicaid Other | Attending: Internal Medicine | Admitting: Internal Medicine

## 2023-01-16 ENCOUNTER — Other Ambulatory Visit: Payer: Self-pay

## 2023-01-16 DIAGNOSIS — F439 Reaction to severe stress, unspecified: Secondary | ICD-10-CM | POA: Diagnosis present

## 2023-01-16 DIAGNOSIS — R339 Retention of urine, unspecified: Secondary | ICD-10-CM | POA: Diagnosis present

## 2023-01-16 DIAGNOSIS — R079 Chest pain, unspecified: Secondary | ICD-10-CM | POA: Diagnosis present

## 2023-01-16 DIAGNOSIS — N179 Acute kidney failure, unspecified: Secondary | ICD-10-CM | POA: Diagnosis present

## 2023-01-16 DIAGNOSIS — E1010 Type 1 diabetes mellitus with ketoacidosis without coma: Principal | ICD-10-CM | POA: Diagnosis present

## 2023-01-16 DIAGNOSIS — Z8249 Family history of ischemic heart disease and other diseases of the circulatory system: Secondary | ICD-10-CM

## 2023-01-16 DIAGNOSIS — N319 Neuromuscular dysfunction of bladder, unspecified: Secondary | ICD-10-CM | POA: Diagnosis present

## 2023-01-16 DIAGNOSIS — D72829 Elevated white blood cell count, unspecified: Secondary | ICD-10-CM | POA: Diagnosis present

## 2023-01-16 DIAGNOSIS — Z833 Family history of diabetes mellitus: Secondary | ICD-10-CM

## 2023-01-16 DIAGNOSIS — E875 Hyperkalemia: Secondary | ICD-10-CM | POA: Diagnosis present

## 2023-01-16 DIAGNOSIS — Z794 Long term (current) use of insulin: Secondary | ICD-10-CM

## 2023-01-16 DIAGNOSIS — R338 Other retention of urine: Secondary | ICD-10-CM | POA: Diagnosis not present

## 2023-01-16 DIAGNOSIS — E860 Dehydration: Secondary | ICD-10-CM | POA: Diagnosis present

## 2023-01-16 DIAGNOSIS — E871 Hypo-osmolality and hyponatremia: Secondary | ICD-10-CM | POA: Diagnosis present

## 2023-01-16 DIAGNOSIS — Z9641 Presence of insulin pump (external) (internal): Secondary | ICD-10-CM | POA: Diagnosis present

## 2023-01-16 DIAGNOSIS — Z888 Allergy status to other drugs, medicaments and biological substances status: Secondary | ICD-10-CM

## 2023-01-16 DIAGNOSIS — R6510 Systemic inflammatory response syndrome (SIRS) of non-infectious origin without acute organ dysfunction: Secondary | ICD-10-CM | POA: Diagnosis present

## 2023-01-16 DIAGNOSIS — R000 Tachycardia, unspecified: Secondary | ICD-10-CM | POA: Diagnosis present

## 2023-01-16 DIAGNOSIS — Z79899 Other long term (current) drug therapy: Secondary | ICD-10-CM

## 2023-01-16 DIAGNOSIS — I10 Essential (primary) hypertension: Secondary | ICD-10-CM | POA: Diagnosis present

## 2023-01-16 DIAGNOSIS — Z91199 Patient's noncompliance with other medical treatment and regimen due to unspecified reason: Secondary | ICD-10-CM | POA: Diagnosis not present

## 2023-01-16 DIAGNOSIS — R9431 Abnormal electrocardiogram [ECG] [EKG]: Secondary | ICD-10-CM | POA: Diagnosis present

## 2023-01-16 DIAGNOSIS — R0682 Tachypnea, not elsewhere classified: Secondary | ICD-10-CM | POA: Diagnosis present

## 2023-01-16 DIAGNOSIS — E10319 Type 1 diabetes mellitus with unspecified diabetic retinopathy without macular edema: Secondary | ICD-10-CM | POA: Diagnosis present

## 2023-01-16 DIAGNOSIS — E1110 Type 2 diabetes mellitus with ketoacidosis without coma: Secondary | ICD-10-CM | POA: Diagnosis present

## 2023-01-16 LAB — LIPASE, BLOOD: Lipase: 24 U/L (ref 11–51)

## 2023-01-16 LAB — CBG MONITORING, ED
Glucose-Capillary: >600 mg/dL (ref 70–99)
Glucose-Capillary: >600 mg/dL (ref 70–99)
Glucose-Capillary: >600 mg/dL (ref 70–99)
Glucose-Capillary: >600 mg/dL (ref 70–99)
Glucose-Capillary: >600 mg/dL (ref 70–99)
Glucose-Capillary: >600 mg/dL (ref 70–99)
Glucose-Capillary: >600 mg/dL (ref 70–99)
Glucose-Capillary: 459 mg/dL — ABNORMAL HIGH (ref 70–99)
Glucose-Capillary: 392 mg/dL — ABNORMAL HIGH (ref 70–99)

## 2023-01-16 LAB — BLOOD GAS, VENOUS
Acid-base deficit: 26.5 mmol/L — ABNORMAL HIGH (ref 0.0–2.0)
Acid-base deficit: 22 mmol/L — ABNORMAL HIGH (ref 0.0–2.0)
Acid-base deficit: 7.7 mmol/L — ABNORMAL HIGH (ref 0.0–2.0)
Bicarbonate: 3.5 mmol/L — ABNORMAL LOW (ref 20.0–28.0)
Bicarbonate: 6.1 mmol/L — ABNORMAL LOW (ref 20.0–28.0)
Bicarbonate: 16.2 mmol/L — ABNORMAL LOW (ref 20.0–28.0)
Drawn by: 31852
Drawn by: 61190
Drawn by: 1528
O2 Saturation: 89.5 %
O2 Saturation: 85.3 %
O2 Saturation: 86.2 %
Patient temperature: 36.4
Patient temperature: 36.5
Patient temperature: 36.7
pCO2, Ven: <18 mm[Hg] — CL (ref 44–60)
pCO2, Ven: 20 mm[Hg] — ABNORMAL LOW (ref 44–60)
pCO2, Ven: 28 mm[Hg] — ABNORMAL LOW (ref 44–60)
pH, Ven: 6.99 — CL (ref 7.25–7.43)
pH, Ven: 7.1 — CL (ref 7.25–7.43)
pH, Ven: 7.37 (ref 7.25–7.43)
pO2, Ven: 66 mm[Hg] — ABNORMAL HIGH (ref 32–45)
pO2, Ven: 56 mm[Hg] — ABNORMAL HIGH (ref 32–45)
pO2, Ven: 50 mm[Hg] — ABNORMAL HIGH (ref 32–45)

## 2023-01-16 LAB — GLUCOSE, CAPILLARY
Glucose-Capillary: 264 mg/dL — ABNORMAL HIGH (ref 70–99)
Glucose-Capillary: 241 mg/dL — ABNORMAL HIGH (ref 70–99)
Glucose-Capillary: 169 mg/dL — ABNORMAL HIGH (ref 70–99)
Glucose-Capillary: 333 mg/dL — ABNORMAL HIGH (ref 70–99)
Glucose-Capillary: 257 mg/dL — ABNORMAL HIGH (ref 70–99)

## 2023-01-16 LAB — BASIC METABOLIC PANEL
Anion gap: 27 — ABNORMAL HIGH (ref 5–15)
BUN: 39 mg/dL — ABNORMAL HIGH (ref 6–20)
BUN: 43 mg/dL — ABNORMAL HIGH (ref 6–20)
BUN: 41 mg/dL — ABNORMAL HIGH (ref 6–20)
BUN: 36 mg/dL — ABNORMAL HIGH (ref 6–20)
CO2: <7 mmol/L — ABNORMAL LOW (ref 22–32)
CO2: <7 mmol/L — ABNORMAL LOW (ref 22–32)
CO2: <7 mmol/L — ABNORMAL LOW (ref 22–32)
CO2: 15 mmol/L — ABNORMAL LOW (ref 22–32)
Calcium: 9.2 mg/dL (ref 8.9–10.3)
Calcium: 8.8 mg/dL — ABNORMAL LOW (ref 8.9–10.3)
Calcium: 9.1 mg/dL (ref 8.9–10.3)
Calcium: 8.8 mg/dL — ABNORMAL LOW (ref 8.9–10.3)
Chloride: 82 mmol/L — ABNORMAL LOW (ref 98–111)
Chloride: 83 mmol/L — ABNORMAL LOW (ref 98–111)
Chloride: 93 mmol/L — ABNORMAL LOW (ref 98–111)
Chloride: 95 mmol/L — ABNORMAL LOW (ref 98–111)
Creatinine, Ser: 2.17 mg/dL — ABNORMAL HIGH (ref 0.61–1.24)
Creatinine, Ser: 2.49 mg/dL — ABNORMAL HIGH (ref 0.61–1.24)
Creatinine, Ser: 2.49 mg/dL — ABNORMAL HIGH (ref 0.61–1.24)
Creatinine, Ser: 1.9 mg/dL — ABNORMAL HIGH (ref 0.61–1.24)
GFR, Estimated: 39 mL/min — ABNORMAL LOW (ref >60)
GFR, Estimated: 33 mL/min — ABNORMAL LOW (ref >60)
GFR, Estimated: 33 mL/min — ABNORMAL LOW (ref >60)
GFR, Estimated: 46 mL/min — ABNORMAL LOW (ref >60)
Glucose, Bld: 1101 mg/dL (ref 70–99)
Glucose, Bld: 1180 mg/dL (ref 70–99)
Glucose, Bld: 581 mg/dL (ref 70–99)
Glucose, Bld: 230 mg/dL — ABNORMAL HIGH (ref 70–99)
Potassium: >7.5 mmol/L (ref 3.5–5.1)
Potassium: >7.5 mmol/L (ref 3.5–5.1)
Potassium: 4.1 mmol/L (ref 3.5–5.1)
Potassium: 4 mmol/L (ref 3.5–5.1)
Sodium: 127 mmol/L — ABNORMAL LOW (ref 135–145)
Sodium: 128 mmol/L — ABNORMAL LOW (ref 135–145)
Sodium: 137 mmol/L (ref 135–145)
Sodium: 137 mmol/L (ref 135–145)

## 2023-01-16 LAB — CBC WITH DIFFERENTIAL/PLATELET
Abs Immature Granulocytes: 0.22 10*3/uL — ABNORMAL HIGH (ref 0.00–0.07)
Basophils Absolute: 0.1 10*3/uL (ref 0.0–0.1)
Basophils Relative: 0 %
Eosinophils Absolute: 0 10*3/uL (ref 0.0–0.5)
Eosinophils Relative: 0 %
HCT: 50.4 % (ref 39.0–52.0)
Hemoglobin: 14.6 g/dL (ref 13.0–17.0)
Immature Granulocytes: 1 %
Lymphocytes Relative: 12 %
Lymphs Abs: 2.8 10*3/uL (ref 0.7–4.0)
MCH: 29.1 pg (ref 26.0–34.0)
MCHC: 29 g/dL — ABNORMAL LOW (ref 30.0–36.0)
MCV: 100.4 fL — ABNORMAL HIGH (ref 80.0–100.0)
Monocytes Absolute: 1.2 10*3/uL — ABNORMAL HIGH (ref 0.1–1.0)
Monocytes Relative: 5 %
Neutro Abs: 19.6 10*3/uL — ABNORMAL HIGH (ref 1.7–7.7)
Neutrophils Relative %: 82 %
Platelets: 526 10*3/uL — ABNORMAL HIGH (ref 150–400)
RBC: 5.02 MIL/uL (ref 4.22–5.81)
RDW: 13.3 % (ref 11.5–15.5)
WBC: 23.9 10*3/uL — ABNORMAL HIGH (ref 4.0–10.5)
nRBC: 0 % (ref 0.0–0.2)

## 2023-01-16 LAB — TROPONIN I (HIGH SENSITIVITY)
Troponin I (High Sensitivity): 5 ng/L (ref <18)
Troponin I (High Sensitivity): 9 ng/L (ref <18)
Troponin I (High Sensitivity): 9 ng/L (ref <18)

## 2023-01-16 LAB — MAGNESIUM: Magnesium: 2.7 mg/dL — ABNORMAL HIGH (ref 1.7–2.4)

## 2023-01-16 LAB — BETA-HYDROXYBUTYRIC ACID
Beta-Hydroxybutyric Acid: >8 mmol/L — ABNORMAL HIGH (ref 0.05–0.27)
Beta-Hydroxybutyric Acid: 4.26 mmol/L — ABNORMAL HIGH (ref 0.05–0.27)

## 2023-01-16 LAB — OSMOLALITY: Osmolality: 379 mosm/kg (ref 275–295)

## 2023-01-16 LAB — MRSA NEXT GEN BY PCR, NASAL: MRSA by PCR Next Gen: NOT DETECTED

## 2023-01-16 MED ORDER — HEPARIN SODIUM (PORCINE) 5000 UNIT/ML IJ SOLN
5000.0000 [IU] | Freq: Three times a day (TID) | INTRAMUSCULAR | Status: DC
  Administered 2023-01-16 – 2023-01-17 (×3): 5000 [IU] via SUBCUTANEOUS
  Filled 2023-01-16 (×3): qty 1

## 2023-01-16 MED ORDER — CALCIUM GLUCONATE-NACL 1-0.675 GM/50ML-% IV SOLN
1.0000 g | Freq: Once | INTRAVENOUS | Status: AC
  Administered 2023-01-16: 1000 mg via INTRAVENOUS
  Filled 2023-01-16: qty 50

## 2023-01-16 MED ORDER — DEXTROSE IN LACTATED RINGERS 5 % IV SOLN: INTRAVENOUS | Status: AC

## 2023-01-16 MED ORDER — PHENOL 1.4 % MT LIQD: 1.0000 | OROMUCOSAL | Status: DC | PRN

## 2023-01-16 MED ORDER — LIDOCAINE HCL URETHRAL/MUCOSAL 2 % EX GEL
1.0000 | Freq: Once | CUTANEOUS | Status: DC
  Filled 2023-01-16: qty 11

## 2023-01-16 MED ORDER — DEXTROSE 50 % IV SOLN: 0.0000 mL | INTRAVENOUS | Status: DC | PRN

## 2023-01-16 MED ORDER — LACTATED RINGERS IV BOLUS
2000.0000 mL | Freq: Once | INTRAVENOUS | Status: AC
  Administered 2023-01-16: 2000 mL via INTRAVENOUS

## 2023-01-16 MED ORDER — ALBUTEROL SULFATE (2.5 MG/3ML) 0.083% IN NEBU
10.0000 mg | INHALATION_SOLUTION | Freq: Once | RESPIRATORY_TRACT | Status: AC
  Administered 2023-01-16: 10 mg via RESPIRATORY_TRACT
  Filled 2023-01-16: qty 12

## 2023-01-16 MED ORDER — INSULIN REGULAR(HUMAN) IN NACL 100-0.9 UT/100ML-% IV SOLN
INTRAVENOUS | Status: AC
  Administered 2023-01-16: 4.2 [IU]/h via INTRAVENOUS
  Filled 2023-01-16 (×2): qty 100

## 2023-01-16 MED ORDER — MORPHINE SULFATE (PF) 2 MG/ML IV SOLN
2.0000 mg | Freq: Once | INTRAVENOUS | Status: AC
  Administered 2023-01-16: 2 mg via INTRAVENOUS
  Filled 2023-01-16: qty 1

## 2023-01-16 MED ORDER — MENTHOL 3 MG MT LOZG
1.0000 | LOZENGE | OROMUCOSAL | Status: DC | PRN
  Administered 2023-01-16: 3 mg via ORAL
  Filled 2023-01-16: qty 9

## 2023-01-16 MED ORDER — ALBUTEROL SULFATE (2.5 MG/3ML) 0.083% IN NEBU
2.5000 mg | INHALATION_SOLUTION | Freq: Once | RESPIRATORY_TRACT | Status: AC
  Administered 2023-01-16: 2.5 mg via RESPIRATORY_TRACT
  Filled 2023-01-16: qty 3

## 2023-01-16 MED ORDER — CARVEDILOL 3.125 MG PO TABS
3.1250 mg | ORAL_TABLET | Freq: Two times a day (BID) | ORAL | Status: DC
  Administered 2023-01-16 – 2023-01-18 (×4): 3.125 mg via ORAL
  Filled 2023-01-16 (×4): qty 1

## 2023-01-16 MED ORDER — SODIUM BICARBONATE 8.4 % IV SOLN
50.0000 meq | Freq: Once | INTRAVENOUS | Status: AC
  Administered 2023-01-16: 50 meq via INTRAVENOUS
  Filled 2023-01-16: qty 50

## 2023-01-16 MED ORDER — LACTATED RINGERS IV SOLN: INTRAVENOUS | Status: DC

## 2023-01-16 MED ORDER — LACTATED RINGERS IV BOLUS
1000.0000 mL | Freq: Once | INTRAVENOUS | Status: AC
  Administered 2023-01-16: 1000 mL via INTRAVENOUS

## 2023-01-16 MED ORDER — CALCIUM GLUCONATE 10 % IV SOLN
1.0000 g | Freq: Once | INTRAVENOUS | Status: DC
  Filled 2023-01-16: qty 10

## 2023-01-16 MED ORDER — LIDOCAINE HCL URETHRAL/MUCOSAL 2 % EX GEL
1.0000 | Freq: Once | CUTANEOUS | Status: AC
  Administered 2023-01-16: 1 via URETHRAL

## 2023-01-16 MED ORDER — INSULIN REGULAR(HUMAN) IN NACL 100-0.9 UT/100ML-% IV SOLN
INTRAVENOUS | Status: DC
  Administered 2023-01-16: 9 [IU]/h via INTRAVENOUS
  Filled 2023-01-16: qty 100

## 2023-01-16 MED ORDER — CHLORHEXIDINE GLUCONATE CLOTH 2 % EX PADS
6.0000 | MEDICATED_PAD | Freq: Every day | CUTANEOUS | Status: DC
  Administered 2023-01-16 – 2023-01-18 (×3): 6 via TOPICAL

## 2023-01-16 MED ORDER — MELATONIN 3 MG PO TABS
6.0000 mg | ORAL_TABLET | Freq: Every day | ORAL | Status: DC
  Administered 2023-01-16 – 2023-01-17 (×2): 6 mg via ORAL
  Filled 2023-01-16 (×2): qty 2

## 2023-01-16 MED ORDER — INSULIN REGULAR BOLUS VIA INFUSION
8.0000 [IU] | Freq: Once | INTRAVENOUS | Status: AC
  Administered 2023-01-16: 8 [IU] via INTRAVENOUS
  Filled 2023-01-16: qty 8

## 2023-01-16 MED ORDER — DEXTROSE IN LACTATED RINGERS 5 % IV SOLN: INTRAVENOUS | Status: DC

## 2023-01-16 NOTE — Assessment & Plan Note (Signed)
Markedly elevated potassium at 7.5 likely due to AKI.  EKG shows prolonged QTc at 639. -Calcium gluconate 1 g given -Sodium bicarbonate given, and albuterol nebs, continue insulin drip - Monitor K closely

## 2023-01-16 NOTE — Assessment & Plan Note (Signed)
Pseudohyponatremia, corrected sodium ~ 143. -Hydrate with LR

## 2023-01-16 NOTE — Assessment & Plan Note (Addendum)
2.5 L was of urine drained from bladder via In-N-Out cath.  Denies prior urinary problems.  - Still retaining urine after in and out, will place foley

## 2023-01-16 NOTE — ED Triage Notes (Signed)
Pt brought in by [facility] from home with c/o hyperglycemia. Pt is a type 1 diabetic and took his insulin last night. Pt's meter reading "HIGH" this morning as well as for EMS. Pt has rapid breathing and vomiting. HR 130, BP 105/80, O2 sat 98% RA, temp 97.8 for EMS.

## 2023-01-16 NOTE — Assessment & Plan Note (Signed)
Creatinine elevated at 2.17 >> 2.49 on recheck.  Likely prerenal 2/2 dehydration from hyperglycemia and vomiting, and likely postrenal component considering marked urinary retention of 2.5 L, and lisinopril. -Hold lisinopril -Aggressively hydrate

## 2023-01-16 NOTE — Assessment & Plan Note (Signed)
Stable. -Hold lisinopril with AKI -Resume carvedilol

## 2023-01-16 NOTE — Progress Notes (Signed)
2 RN's at bedside unable to place foley catheter. Urinal provided to pt at bedside.

## 2023-01-16 NOTE — Assessment & Plan Note (Addendum)
Uncontrolled type I diabetic, markedly acidotic and hyperglycemic, with VBG showing pH of 6.99, serum bicarb of less than 7, and calculated anion gap greater than 38, blood glucose of 1101.  Increased serum osmolality of 279.  Mental status is intact. History of noncompliance with insulins.  He supposed be on an insulin pump, but reports since discharge 3 weeks ago he has been using insulin injections. ? Dose.  Does not follow with an endocrinologist, in the process of establishing care. - EDP consulted PCCM- Insulin bolus 8u given and then drip started, bicarb pushes recommended. -Continue insulin drip -BMP Q4 hourly X 5 -Repeat VBG -5 L bolus given, continue LR 200c/hr

## 2023-01-16 NOTE — ED Provider Notes (Addendum)
[place] EMERGENCY DEPARTMENT AT [facility] Provider Note   CSN: [id number] Arrival date & time: 01/16/23  0848     History  Chief Complaint  Patient presents with   Hyperglycemia    Barry Pittman is a 37 y.o. male.  HPI Patient presents for hyperglycemia.  Medical history includes DM, HTN.  He states that his blood sugar has been high for the past 24 hours.  He has had nausea, chest pain, abdominal pain, nausea, vomiting for the past 12 hours.  He last took insulin last night.  He states that this was short acting.  EMS noted CBG reading of "high" on glucometer.  Per chart review, he has had multiple admissions for DKA this year.    Home Medications Prior to Admission medications   Medication Sig Start Date End Date Taking? Authorizing Provider  blood glucose meter kit and supplies KIT Dispense based on patient and insurance preference. Use up to four times daily as directed. (FOR ICD-9 250.00, 250.01). 12/27/22   Azucena Fallen, MD  carvedilol (COREG) 3.125 MG tablet Take 1 tablet (3.125 mg total) by mouth 2 (two) times daily with a meal. 12/27/22   Azucena Fallen, MD  Glucagon (GVOKE HYPOPEN 1-PACK) 1 MG/0.2ML SOAJ Inject 1 mg as directed daily as needed for up to 1 dose (low blood sugar). 12/27/22   Azucena Fallen, MD  insulin glargine (LANTUS) 100 UNIT/ML injection Inject 0.4 mLs (40 Units total) into the skin daily. Take this daily OR use the insulin pump. DO NOT USE BOTH SIMULTANEOUSLY. 12/27/22   Azucena Fallen, MD  Insulin Human (INSULIN PUMP) SOLN Inject 1 each into the skin as directed. 12/25/22   Azucena Fallen, MD  insulin lispro (HUMALOG) 100 UNIT/ML injection Inject 0.86 mLs (86 Units total) into the skin as directed. VIA PUMP 12/27/22   Azucena Fallen, MD  Insulin Pump Accessories MISC 1 each by Does not apply route daily. Patient requires Mini-Med Infusion set 12/27/22   Azucena Fallen, MD  Lancets (ACCU-CHEK SOFT  [facility]) lancets Use as directed. 12/27/22   Azucena Fallen, MD  lisinopril (ZESTRIL) 20 MG tablet Take 1 tablet (20 mg total) by mouth daily. 12/28/22   Azucena Fallen, MD  ondansetron (ZOFRAN) 4 MG tablet Take 1 tablet (4 mg total) by mouth every 6 (six) hours as needed for nausea. 12/27/22   Azucena Fallen, MD      Allergies    Novolog [insulin aspart]    Review of Systems   Review of Systems  Constitutional:  Positive for fatigue.  Cardiovascular:  Positive for chest pain.  Gastrointestinal:  Positive for abdominal pain, nausea and vomiting.  All other systems reviewed and are negative.   Physical Exam Updated Vital Signs BP 119/60   Pulse (!) 123   Temp 97.7 F (36.5 C) (Axillary)   Resp (!) 29   Ht 5\' 11"  (1.803 m)   Wt 86.2 kg   SpO2 99%   BMI 26.50 kg/m  Physical Exam Vitals and nursing note reviewed.  Constitutional:      General: He is not in acute distress.    Appearance: Normal appearance. He is well-developed. He is ill-appearing. He is not toxic-appearing or diaphoretic.  HENT:     Head: Normocephalic and atraumatic.     Right Ear: External ear normal.     Left Ear: External ear normal.     Nose: Nose normal.     Mouth/Throat:  Mouth: Mucous membranes are moist.  Eyes:     Extraocular Movements: Extraocular movements intact.     Conjunctiva/sclera: Conjunctivae normal.  Cardiovascular:     Rate and Rhythm: Normal rate and regular rhythm.     Heart sounds: No murmur heard. Pulmonary:     Effort: Pulmonary effort is normal. Tachypnea present. No respiratory distress.     Breath sounds: Normal breath sounds.  Abdominal:     General: There is no distension.     Palpations: Abdomen is soft.     Tenderness: There is no abdominal tenderness.  Musculoskeletal:        General: No swelling. Normal range of motion.     Cervical back: Normal range of motion and neck supple.  Skin:    General: Skin is warm and dry.     Coloration: Skin is not  jaundiced or pale.  Neurological:     General: No focal deficit present.     Mental Status: He is alert and oriented to person, place, and time.  Psychiatric:        Mood and Affect: Mood normal.        Behavior: Behavior normal.     ED Results / Procedures / Treatments   Labs (all labs ordered are listed, but only abnormal results are displayed) Labs Reviewed  BASIC METABOLIC PANEL - Abnormal; Notable for the following components:      Result Value   Sodium 127 (*)    Potassium >7.5 (*)    Chloride 82 (*)    CO2 <7 (*)    Glucose, Bld 1,101 (*)    BUN 39 (*)    Creatinine, Ser 2.17 (*)    GFR, Estimated 39 (*)    All other components within normal limits  BETA-HYDROXYBUTYRIC ACID - Abnormal; Notable for the following components:   Beta-Hydroxybutyric Acid >8.00 (*)    All other components within normal limits  CBC WITH DIFFERENTIAL/PLATELET - Abnormal; Notable for the following components:   WBC 23.9 (*)    MCV 100.4 (*)    MCHC 29.0 (*)    Platelets 526 (*)    Neutro Abs 19.6 (*)    Monocytes Absolute 1.2 (*)    Abs Immature Granulocytes 0.22 (*)    All other components within normal limits  BLOOD GAS, VENOUS - Abnormal; Notable for the following components:   pH, Ven 6.99 (*)    pCO2, Ven <18 (*)    pO2, Ven 66 (*)    Bicarbonate 3.5 (*)    Acid-base deficit 26.5 (*)    All other components within normal limits  OSMOLALITY - Abnormal; Notable for the following components:   Osmolality 379 (*)    All other components within normal limits  MAGNESIUM - Abnormal; Notable for the following components:   Magnesium 2.7 (*)    All other components within normal limits  BASIC METABOLIC PANEL - Abnormal; Notable for the following components:   Sodium 128 (*)    Potassium >7.5 (*)    Chloride 83 (*)    CO2 <7 (*)    Glucose, Bld 1,180 (*)    BUN 43 (*)    Creatinine, Ser 2.49 (*)    Calcium 8.8 (*)    GFR, Estimated 33 (*)    All other components within normal  limits  BLOOD GAS, VENOUS - Abnormal; Notable for the following components:   pH, Ven 7.1 (*)    pCO2, Ven 20 (*)    pO2, Ven  56 (*)    Bicarbonate 6.1 (*)    Acid-base deficit 22.0 (*)    All other components within normal limits  CBG MONITORING, ED - Abnormal; Notable for the following components:   Glucose-Capillary >600 (*)    All other components within normal limits  CBG MONITORING, ED - Abnormal; Notable for the following components:   Glucose-Capillary >600 (*)    All other components within normal limits  CBG MONITORING, ED - Abnormal; Notable for the following components:   Glucose-Capillary >600 (*)    All other components within normal limits  CBG MONITORING, ED - Abnormal; Notable for the following components:   Glucose-Capillary >600 (*)    All other components within normal limits  CBG MONITORING, ED - Abnormal; Notable for the following components:   Glucose-Capillary >600 (*)    All other components within normal limits  CBG MONITORING, ED - Abnormal; Notable for the following components:   Glucose-Capillary >600 (*)    All other components within normal limits  CBG MONITORING, ED - Abnormal; Notable for the following components:   Glucose-Capillary >600 (*)    All other components within normal limits  LIPASE, BLOOD  BASIC METABOLIC PANEL  BASIC METABOLIC PANEL  BASIC METABOLIC PANEL  BETA-HYDROXYBUTYRIC ACID  URINALYSIS, ROUTINE W REFLEX MICROSCOPIC  BASIC METABOLIC PANEL  BETA-HYDROXYBUTYRIC ACID  I-STAT CHEM 8, ED  TROPONIN I (HIGH SENSITIVITY)  TROPONIN I (HIGH SENSITIVITY)    EKG EKG Interpretation Date/Time:  Monday January 16 2023 09:08:33 EDT Ventricular Rate:  130 PR Interval:  116 QRS Duration:  112 QT Interval:  434 QTC Calculation: 639 R Axis:   96  Text Interpretation: Sinus tachycardia Probable left atrial enlargement Incomplete right bundle branch block ST elev, probable normal early repol pattern Prolonged QT interval Confirmed  by Gloris Manchester ([id number]) on 01/16/2023 9:38:33 AM  Radiology CT CHEST ABDOMEN PELVIS WO CONTRAST  Result Date: 01/16/2023 CLINICAL DATA:  Hyperglycemia with tachycardia, tachypnea, and emesis EXAM: CT CHEST, ABDOMEN AND PELVIS WITHOUT CONTRAST TECHNIQUE: Multidetector CT imaging of the chest, abdomen and pelvis was performed following the standard protocol without IV contrast. RADIATION DOSE REDUCTION: This exam was performed according to the departmental dose-optimization program which includes automated exposure control, adjustment of the mA and/or kV according to patient size and/or use of iterative reconstruction technique. COMPARISON:  CT chest, abdomen, pelvis dated 09/22/2016 FINDINGS: CT CHEST FINDINGS Decreased sensitivity and specificity for detailed findings due to motion artifact. Cardiovascular: Normal heart size. No significant pericardial fluid/thickening. Great vessels are normal in course and caliber. Mediastinum/Nodes: Imaged thyroid gland without nodules meeting criteria for imaging follow-up by size. Normal esophagus. No pathologically enlarged axillary, supraclavicular, mediastinal, or hilar lymph nodes. Lungs/Pleura: The central airways are patent. Unchanged 3 mm right middle lobe nodule (3:66). No specific follow-up imaging recommended. No pneumothorax. No pleural effusion. Musculoskeletal: No acute or abnormal lytic or blastic osseous lesions. Mild anterior wedging of T11 and T12, unchanged. CT ABDOMEN PELVIS FINDINGS Hepatobiliary: No focal hepatic lesions. No intra or extrahepatic biliary ductal dilation. Normal gallbladder. Pancreas: No focal lesions or main ductal dilation. Spleen: Normal in size without focal abnormality. Adrenals/Urinary Tract: No adrenal nodules. No suspicious renal mass on this noncontrast enhanced examination, calculi, or hydronephrosis. Left-greater-than-right renal pelvic fullness. Markedly distended urinary bladder. No focal mural thickening. Stomach/Bowel: Normal  appearance of the stomach. No evidence of bowel wall thickening, distention, or inflammatory changes. Normal appendix. Vascular/Lymphatic: No significant vascular findings are present. Retroaortic left renal vein. No enlarged abdominal  or pelvic lymph nodes. Reproductive: Prostate is unremarkable. Calcification of the vas deferens. Other: No free fluid, fluid collection, or free air. Musculoskeletal: No acute or abnormal lytic or blastic osseous findings. IMPRESSION: 1. Markedly distended urinary bladder with left-greater-than-right renal pelvic fullness. 2. Otherwise, no acute findings in the chest, abdomen, or pelvis. Electronically Signed   By: Agustin Cree M.D.   On: 01/16/2023 15:07   DG Chest Portable 1 View  Result Date: 01/16/2023 CLINICAL DATA:  Chest pain.  Shortness of breath. EXAM: PORTABLE CHEST 1 VIEW COMPARISON:  09/21/2016. FINDINGS: Low lung volume. Bilateral lung fields are clear. Bilateral lateral costophrenic angles are clear. Normal cardio-mediastinal silhouette. No acute osseous abnormalities. The soft tissues are within normal limits. IMPRESSION: 1. Low lung volume. No active disease. Electronically Signed   By: Jules Schick M.D.   On: 01/16/2023 10:09    Procedures Procedures    Medications Ordered in ED Medications  insulin regular, human (MYXREDLIN) 100 units/ 100 mL infusion (9 Units/hr Intravenous New Bag/Given 01/16/23 1036)  dextrose 5 % in lactated ringers infusion (has no administration in time range)  dextrose 50 % solution 0-50 mL (has no administration in time range)  sodium bicarbonate injection 50 mEq (has no administration in time range)  calcium gluconate 1 g/ 50 mL sodium chloride IVPB (has no administration in time range)  lactated ringers bolus 1,000 mL (has no administration in time range)  lactated ringers bolus 2,000 mL (0 mLs Intravenous Stopped 01/16/23 1402)  sodium bicarbonate injection 50 mEq (50 mEq Intravenous Given 01/16/23 1039)  albuterol  (PROVENTIL) (2.5 MG/3ML) 0.083% nebulizer solution 2.5 mg (2.5 mg Nebulization Given 01/16/23 1108)  calcium gluconate 1 g/ 50 mL sodium chloride IVPB (0 mg Intravenous Stopped 01/16/23 1402)  lactated ringers bolus 1,000 mL (0 mLs Intravenous Stopped 01/16/23 1401)  albuterol (PROVENTIL) (2.5 MG/3ML) 0.083% nebulizer solution 10 mg (10 mg Nebulization Given 01/16/23 1310)  lactated ringers bolus 1,000 mL (0 mLs Intravenous Stopped 01/16/23 1402)  sodium bicarbonate injection 50 mEq (50 mEq Intravenous Given 01/16/23 1259)  insulin regular bolus via infusion 8 Units (8 Units Intravenous Bolus from Bag 01/16/23 1255)    ED Course/ Medical Decision Making/ A&P                                 Medical Decision Making Amount and/or Complexity of Data Reviewed Labs: ordered. Radiology: ordered.  Risk Prescription drug management. Decision regarding hospitalization.   This patient presents to the ED for concern of abdominal pain, nausea, vomiting, this involves an extensive number of treatment options, and is a complaint that carries with it a high risk of complications and morbidity.  The differential diagnosis includes DKA, enteritis, colitis, SBO, other infection, other metabolic derangements   Co morbidities that complicate the patient evaluation  DM, HTN   Additional history obtained:  Additional history obtained from EMS External records from outside source obtained and reviewed including EMR   Lab Tests:  I Ordered, and personally interpreted labs.  The pertinent results include: Severe hyperglycemia and acidosis consistent with DKA; AKI is present; osmolality is also elevated.  A leukocytosis is present.  This may be secondary to stress to margination from his frequent vomiting.   Imaging Studies ordered:  I ordered imaging studies including chest x-ray, CT of chest, abdomen, pelvis I independently visualized and interpreted imaging which showed distended bladder without other  acute findings I agree with  the radiologist interpretation   Cardiac Monitoring: / EKG:  The patient was maintained on a cardiac monitor.  I personally viewed and interpreted the cardiac monitored which showed an underlying rhythm of: Sinus rhythm   Consultations Obtained:  I requested consultation with the intensivist, Dr. Chestine Spore,  and discussed lab and imaging findings as well as pertinent plan - they recommend: Insulin bolus, additional IV fluids, additional bicarb, admission at [facility] if patient improves   Problem List / ED Course / Critical interventions / Medication management  Patient with history of DM and multiple admissions for DKA this year presents for hyperglycemia, chest pain, abdominal pain, nausea, and vomiting.  Symptoms have been progressive over the past 24 hours.  On arrival, he is tachycardic and tachypneic.  Kussmaul breathing is noted on exam.  2 L of IV fluid were ordered.  Laboratory workup was initiated.  On reassessment, patient reports mildly improved symptoms.  He states that he does not have nausea.  Abdominal pain and chest pain have improved.  He remains tachypneic with Kussmaul breathing.  Lab work confirms DKA.  He was also found to have markedly elevated potassium, greater than 7.5.  Insulin gtt. was initiated.  Additional IV fluids were ordered.  Sodium bicarb amp and albuterol were ordered to further temporize his hyperkalemia.  A repeat BMP was ordered which showed persistent severely elevated potassium with glucose that actually went up.  Additional IV fluids were ordered.  I spoke with intensivist on-call, Dr. Chestine Spore, who recommends 0.1 unit/kg bolus of insulin.  This was ordered.  Additional amp of bicarb was ordered.  Patient was unable to urinate while in the ED.  He does appear to be distended in his lower abdomen.  In-N-Out catheter was performed with 2500 cc of urine output.  On repeat blood gas, patient had improvement in his acidosis.  He continued to  have improved symptoms.  His nausea remains resolved.  His tachycardia and tachypnea improved.  He remained normotensive.  I spoke with Dr. Chestine Spore again, who does feel that patient can be admitted here if hospitalist is okay with it.  I spoke with hospitalist, Dr. Mariea Clonts, who will evaluate the patient.  Care of patient was signed out to oncoming ED provider. I ordered medication including IV fluids and insulin for DKA; calcium gluconate, albuterol, and sodium bicarbonate for temporization of hyperkalemia Reevaluation of the patient after these medicines showed that the patient improved I have reviewed the patients home medicines and have made adjustments as needed   Social Determinants of Health:  Frequent hospitalizations   CRITICAL CARE Performed by: Gloris Manchester   Total critical care time: 42 minutes  Critical care time was exclusive of separately billable procedures and treating other patients.  Critical care was necessary to treat or prevent imminent or life-threatening deterioration.  Critical care was time spent personally by me on the following activities: development of treatment plan with patient and/or surrogate as well as nursing, discussions with consultants, evaluation of patient's response to treatment, examination of patient, obtaining history from patient or surrogate, ordering and performing treatments and interventions, ordering and review of laboratory studies, ordering and review of radiographic studies, pulse oximetry and re-evaluation of patient's condition.         Final Clinical Impression(s) / ED Diagnoses Final diagnoses:  Diabetic ketoacidosis without coma associated with type 1 diabetes mellitus (HCC)  Hyperkalemia  AKI (acute kidney injury) (HCC)  Urine retention    Rx / DC Orders ED Discharge Orders  None         Gloris Manchester, MD 01/16/23 1530    Gloris Manchester, MD 01/16/23 [phone]

## 2023-01-16 NOTE — Assessment & Plan Note (Signed)
Uncontrolled diabetes with A1c of 13.1.

## 2023-01-16 NOTE — H&P (Addendum)
History and Physical    Barry Pittman:[id number] DOB: Jul 02, 1985 DOA: 01/16/2023  PCP: Patient, No Pcp Per   Patient coming from: Home  I have personally briefly reviewed patient's old medical records in [facility]  Chief Complaint: High Blood sugars  HPI: Barry Pittman is a 37 y.o. male with medical history significant for hypertension, diabetes mellitus. Patient presented to the ED with complaints of high blood sugars.  He reports he was vomiting all night, blood sugars this morning read high.  He reports he took his insulin last night he does not remember how many units he took.  He reports compliance with his insulin injections over the past 3 weeks.  He has not been using his insulin pump.  Reports lower abdominal pain that is improved since In-N-Out cath was done in the ED. He denies prior problems with voiding.  He currently does not see an endocrinologist, he is on a wait list to establish care with an endocrinologist in [place].  Recent hospitalization 9/7 to 9/10 also for DKA.  ED Course: Tmax 97.7.  Heart rate 123-143.  Tachypneic with respiratory 28-44.  Blood pressure systolic 111-151.  O2 sats greater than 97%. Blood sugars-1101, with serum bicarb of less than 7, BHB greater than 8, serum osmolality elevated at 379, marked elevated potassium at 7.5.  VBG shows pH of 6.99. Chest X-ray unremarkable.  CT abdomen and pelvis shows marked distended urinary bladder with left greater than right renal pelvic fullness. Total of 5 L bolus given.  Calcium gluconate 1 g, sodium bicarb 50 mEq albuterol nebs given. EDP talked to PCCM Dr. Chestine Spore, recommended giving 0.1 units/kg bolus insulin before starting insulin drip, bicarb pushes to temporize K, repeat labs in 1 hour to call back via [facility], to determine if transfer is still needed.  Review of Systems: As per HPI all other systems reviewed and negative.  Past Medical History:  Diagnosis Date   Diabetes  mellitus without complication (HCC)    type 1   Hypertension     Past Surgical History:  Procedure Laterality Date   EYE SURGERY     For retinopathy      reports that he has never smoked. He has never used smokeless tobacco. He reports current alcohol use. He reports that he does not use drugs.  Allergies  Allergen Reactions   Novolog [Insulin Aspart] Other (See Comments)    "goes into DKA"    Family History  Problem Relation Age of Onset   Diabetes Mother    Heart disease Mother    Diabetes Father    Heart disease Father    Prior to Admission medications   Medication Sig Start Date End Date Taking? Authorizing Provider  blood glucose meter kit and supplies KIT Dispense based on patient and insurance preference. Use up to four times daily as directed. (FOR ICD-9 250.00, 250.01). 12/27/22   Azucena Fallen, MD  carvedilol (COREG) 3.125 MG tablet Take 1 tablet (3.125 mg total) by mouth 2 (two) times daily with a meal. 12/27/22   Azucena Fallen, MD  Glucagon (GVOKE HYPOPEN 1-PACK) 1 MG/0.2ML SOAJ Inject 1 mg as directed daily as needed for up to 1 dose (low blood sugar). 12/27/22   Azucena Fallen, MD  insulin glargine (LANTUS) 100 UNIT/ML injection Inject 0.4 mLs (40 Units total) into the skin daily. Take this daily OR use the insulin pump. DO NOT USE BOTH SIMULTANEOUSLY. 12/27/22   Azucena Fallen, MD  Insulin Human (INSULIN PUMP) SOLN Inject 1 each into the skin as directed. 12/25/22   Azucena Fallen, MD  insulin lispro (HUMALOG) 100 UNIT/ML injection Inject 0.86 mLs (86 Units total) into the skin as directed. VIA PUMP 12/27/22   Azucena Fallen, MD  Insulin Pump Accessories MISC 1 each by Does not apply route daily. Patient requires Mini-Med Infusion set 12/27/22   Azucena Fallen, MD  Lancets (ACCU-CHEK SOFT [facility]) lancets Use as directed. 12/27/22   Azucena Fallen, MD  lisinopril (ZESTRIL) 20 MG tablet Take 1 tablet (20 mg total) by mouth  daily. 12/28/22   Azucena Fallen, MD  ondansetron (ZOFRAN) 4 MG tablet Take 1 tablet (4 mg total) by mouth every 6 (six) hours as needed for nausea. 12/27/22   Azucena Fallen, MD    Physical Exam: Vitals:   01/16/23 1315 01/16/23 1330 01/16/23 1400 01/16/23 1445  BP: (!) 150/74 111/65 (!) 114/59 119/60  Pulse: (!) 132 (!) 128 (!) 134 (!) 123  Resp: (!) 34 (!) 28 (!) 29 (!) 29  Temp:  97.7 F (36.5 C)    TempSrc:  Axillary    SpO2: 99% 99% 100% 99%  Weight:      Height:        Constitutional: Acutely ill-appearing, Vitals:   01/16/23 1315 01/16/23 1330 01/16/23 1400 01/16/23 1445  BP: (!) 150/74 111/65 (!) 114/59 119/60  Pulse: (!) 132 (!) 128 (!) 134 (!) 123  Resp: (!) 34 (!) 28 (!) 29 (!) 29  Temp:  97.7 F (36.5 C)    TempSrc:  Axillary    SpO2: 99% 99% 100% 99%  Weight:      Height:       Eyes: Pupils equal, lids and conjunctivae normal ENMT: Mucous membranes are markedly dry  Neck: normal, supple, no masses, no thyromegaly Respiratory:, Tachypneic, clear to auscultation bilaterally, no wheezing, no crackles.  Cardiovascular: Tachycardic, regular rate and rhythm, no murmurs / rubs / gallops. No extremity edema.  Extremities warm. Abdomen: no tenderness, no masses palpated. No hepatosplenomegaly. Bowel sounds positive.  Musculoskeletal: no clubbing / cyanosis. No joint deformity upper and lower extremities.  Skin: no rashes, lesions, ulcers. No induration Neurologic: No facial asymmetry, moving extremities spontaneously.  Psychiatric: Normal judgment and insight. Alert and oriented x 3. Normal mood.   Labs on Admission: I have personally reviewed following labs and imaging studies  CBC: Recent Labs  Lab 01/16/23 0924  WBC 23.9*  NEUTROABS 19.6*  HGB 14.6  HCT 50.4  MCV 100.4*  PLT 526*   Basic Metabolic Panel: Recent Labs  Lab 01/16/23 0924 01/16/23 1107  NA 127* 128*  K >7.5* >7.5*  CL 82* 83*  CO2 <7* <7*  GLUCOSE 1,101* 1,180*  BUN 39*  43*  CREATININE 2.17* 2.49*  CALCIUM 9.2 8.8*  MG 2.7*  --     Recent Labs  Lab 01/16/23 0924  LIPASE 24   CBG: Recent Labs  Lab 01/16/23 1250 01/16/23 1328 01/16/23 1400 01/16/23 1448 01/16/23 1525  GLUCAP >600* >600* >600* >600* >600*    Radiological Exams on Admission: CT CHEST ABDOMEN PELVIS WO CONTRAST  Result Date: 01/16/2023 CLINICAL DATA:  Hyperglycemia with tachycardia, tachypnea, and emesis EXAM: CT CHEST, ABDOMEN AND PELVIS WITHOUT CONTRAST TECHNIQUE: Multidetector CT imaging of the chest, abdomen and pelvis was performed following the standard protocol without IV contrast. RADIATION DOSE REDUCTION: This exam was performed according to the departmental dose-optimization program which includes automated exposure control, adjustment of  the mA and/or kV according to patient size and/or use of iterative reconstruction technique. COMPARISON:  CT chest, abdomen, pelvis dated 09/22/2016 FINDINGS: CT CHEST FINDINGS Decreased sensitivity and specificity for detailed findings due to motion artifact. Cardiovascular: Normal heart size. No significant pericardial fluid/thickening. Great vessels are normal in course and caliber. Mediastinum/Nodes: Imaged thyroid gland without nodules meeting criteria for imaging follow-up by size. Normal esophagus. No pathologically enlarged axillary, supraclavicular, mediastinal, or hilar lymph nodes. Lungs/Pleura: The central airways are patent. Unchanged 3 mm right middle lobe nodule (3:66). No specific follow-up imaging recommended. No pneumothorax. No pleural effusion. Musculoskeletal: No acute or abnormal lytic or blastic osseous lesions. Mild anterior wedging of T11 and T12, unchanged. CT ABDOMEN PELVIS FINDINGS Hepatobiliary: No focal hepatic lesions. No intra or extrahepatic biliary ductal dilation. Normal gallbladder. Pancreas: No focal lesions or main ductal dilation. Spleen: Normal in size without focal abnormality. Adrenals/Urinary Tract: No  adrenal nodules. No suspicious renal mass on this noncontrast enhanced examination, calculi, or hydronephrosis. Left-greater-than-right renal pelvic fullness. Markedly distended urinary bladder. No focal mural thickening. Stomach/Bowel: Normal appearance of the stomach. No evidence of bowel wall thickening, distention, or inflammatory changes. Normal appendix. Vascular/Lymphatic: No significant vascular findings are present. Retroaortic left renal vein. No enlarged abdominal or pelvic lymph nodes. Reproductive: Prostate is unremarkable. Calcification of the vas deferens. Other: No free fluid, fluid collection, or free air. Musculoskeletal: No acute or abnormal lytic or blastic osseous findings. IMPRESSION: 1. Markedly distended urinary bladder with left-greater-than-right renal pelvic fullness. 2. Otherwise, no acute findings in the chest, abdomen, or pelvis. Electronically Signed   By: Agustin Cree M.D.   On: 01/16/2023 15:07   DG Chest Portable 1 View  Result Date: 01/16/2023 CLINICAL DATA:  Chest pain.  Shortness of breath. EXAM: PORTABLE CHEST 1 VIEW COMPARISON:  09/21/2016. FINDINGS: Low lung volume. Bilateral lung fields are clear. Bilateral lateral costophrenic angles are clear. Normal cardio-mediastinal silhouette. No acute osseous abnormalities. The soft tissues are within normal limits. IMPRESSION: 1. Low lung volume. No active disease. Electronically Signed   By: Jules Schick M.D.   On: 01/16/2023 10:09    EKG: Independently reviewed.  Sinus rhythm, rate 130, QTc prolonged at 639.  No significant change from prior.  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  Increased serum osmolality of 279.  Mental status is intact. History of noncompliance with insulins.  He supposed be on an insulin pump, but reports since discharge 3 weeks ago he has been using insulin injections. ? Dose.  Does not follow with an endocrinologist, in the process of establishing care. - EDP consulted PCCM- Insulin bolus 8u given and then drip started, bicarb pushes recommended. -Continue insulin drip -BMP Q4 hourly X 5 -Repeat VBG -5 L bolus given, continue LR 200c/hr   Acute urinary retention 2.5 L was of urine drained from bladder via In-N-Out cath.  Denies prior urinary problems.  - Still retaining urine after in and out, will place foley  Type 1 diabetes mellitus with retinopathy (  HCC) Uncontrolled diabetes with A1c of 13.1.  AKI (acute kidney injury) (HCC) Creatinine elevated at 2.17 >> 2.49 on recheck.  Likely prerenal 2/2 dehydration from hyperglycemia and vomiting, and likely postrenal component considering marked urinary retention of 2.5 L, and lisinopril. -Hold lisinopril -Aggressively hydrate  Hyperkalemia Markedly elevated potassium at 7.5 likely due to AKI.  EKG shows prolonged QTc at 639. -Calcium gluconate 1 g given -Sodium bicarbonate given, and albuterol nebs, continue insulin drip - Monitor K closely  Leukocytosis Leukocytosis of 23.9, prior elevations in the setting of DKA.  At this time will attribute this to stress reaction.  No source of infection identified at this time, x-ray is clear CT abdomen and pelvis not suggestive.  - Trend WBC   Hyponatremia Pseudohyponatremia, corrected sodium ~ 143. -Hydrate with LR  Hypertension Stable. -Hold lisinopril with AKI -Resume carvedilol   DVT prophylaxis: Heparin Code Status: FULL Family Communication:  None at bedside Disposition Plan: ~ 2 days Consults called: None   Admission status: Inpt, ICU I certify that at the point of admission it is my clinical judgment that the patient will require inpatient hospital care spanning beyond 2 midnights from the point of admission due to high intensity of service, high risk for further deterioration and high frequency of surveillance required.   CRITICAL CARE Performed by: Onnie Boer   Total critical care time: 70 minutes  Critical care time was exclusive of separately billable procedures and treating other patients.  Critical care was necessary to treat or prevent imminent or life-threatening deterioration.  Critical care was time spent personally by me on the following activities: development of treatment plan with patient and/or surrogate as well as nursing, discussions with consultants, evaluation of patient's response to treatment, examination of patient, obtaining history from patient or surrogate, ordering and performing treatments and interventions, ordering and review of laboratory studies, ordering and review of radiographic studies, pulse oximetry and re-evaluation of patient's condition.     Author: Onnie Boer, MD 01/16/2023 4:31 PM  For on call review [email or web address].

## 2023-01-16 NOTE — Progress Notes (Signed)
   PCCM transfer request    Sending physician: Durwin Nora  Sending facility: [facility]  Reason for transfer: severe DKA  Brief case summary: 37 y/o IDDM with DKA, got 2 L IVF and was started on insulin per endotool (no bolus in protocol anymore), acidotic. High K+. On second BMP BG had risen. Question if he should transfer or can stay there to be managed.   Recommendations made prior to transfer: additional 2L IVF bolus, needs insulin bolus (not given because no longer part of the order set-- should be 0.1units/kg before the drip starts-- would give that now IV), bicarb pushes to temporize K+ with additional insulin bolus, repeat blood gas in 1 hr after that is all given.   Transfer accepted: TBD-- they will repeat labs and call back in ~1 hr via carelink.    Steffanie Dunn 01/16/23 12:41 PM [facility]  For contact information, see Amion. If no response to pager, please call PCCM consult pager. After hours, 7PM- 7AM, please call Elink.

## 2023-01-16 NOTE — ED Notes (Signed)
CRITICAL VALUE STICKER  CRITICAL VALUE: venous ph 6.99, pCO2 < 18  MD NOTIFIED: Gloris Manchester, MD  TIME NOTIFIED: [phone]

## 2023-01-16 NOTE — Assessment & Plan Note (Signed)
Leukocytosis of 23.9, prior elevations in the setting of DKA.  At this time will attribute this to stress reaction.  No source of infection identified at this time, x-ray is clear CT abdomen and pelvis not suggestive.  - Trend WBC

## 2023-01-16 NOTE — ED Notes (Signed)
Informed Dr. Durwin Nora that pt is unable to urinate, bladder firm and distended on palpation. Verbal order received for in and out, pt tolerated well. 2500 ml clear, yellow urine.

## 2023-01-17 LAB — CBC WITH DIFFERENTIAL/PLATELET
Abs Immature Granulocytes: 0.09 10*3/uL — ABNORMAL HIGH (ref 0.00–0.07)
Basophils Absolute: 0 10*3/uL (ref 0.0–0.1)
Basophils Relative: 0 %
Eosinophils Absolute: 0.1 10*3/uL (ref 0.0–0.5)
Eosinophils Relative: 0 %
HCT: 32.9 % — ABNORMAL LOW (ref 39.0–52.0)
Hemoglobin: 11.4 g/dL — ABNORMAL LOW (ref 13.0–17.0)
Immature Granulocytes: 1 %
Lymphocytes Relative: 13 %
Lymphs Abs: 1.8 10*3/uL (ref 0.7–4.0)
MCH: 30.2 pg (ref 26.0–34.0)
MCHC: 34.7 g/dL (ref 30.0–36.0)
MCV: 87.3 fL (ref 80.0–100.0)
Monocytes Absolute: 1.3 10*3/uL — ABNORMAL HIGH (ref 0.1–1.0)
Monocytes Relative: 9 %
Neutro Abs: 10.8 10*3/uL — ABNORMAL HIGH (ref 1.7–7.7)
Neutrophils Relative %: 77 %
Platelets: 325 10*3/uL (ref 150–400)
RBC: 3.77 MIL/uL — ABNORMAL LOW (ref 4.22–5.81)
RDW: 14 % (ref 11.5–15.5)
WBC: 14 10*3/uL — ABNORMAL HIGH (ref 4.0–10.5)
nRBC: 0 % (ref 0.0–0.2)

## 2023-01-17 LAB — GLUCOSE, CAPILLARY
Glucose-Capillary: 105 mg/dL — ABNORMAL HIGH (ref 70–99)
Glucose-Capillary: 110 mg/dL — ABNORMAL HIGH (ref 70–99)
Glucose-Capillary: 144 mg/dL — ABNORMAL HIGH (ref 70–99)
Glucose-Capillary: 149 mg/dL — ABNORMAL HIGH (ref 70–99)
Glucose-Capillary: 154 mg/dL — ABNORMAL HIGH (ref 70–99)
Glucose-Capillary: 163 mg/dL — ABNORMAL HIGH (ref 70–99)
Glucose-Capillary: 171 mg/dL — ABNORMAL HIGH (ref 70–99)
Glucose-Capillary: 177 mg/dL — ABNORMAL HIGH (ref 70–99)
Glucose-Capillary: 183 mg/dL — ABNORMAL HIGH (ref 70–99)
Glucose-Capillary: 233 mg/dL — ABNORMAL HIGH (ref 70–99)
Glucose-Capillary: 238 mg/dL — ABNORMAL HIGH (ref 70–99)
Glucose-Capillary: 256 mg/dL — ABNORMAL HIGH (ref 70–99)
Glucose-Capillary: 319 mg/dL — ABNORMAL HIGH (ref 70–99)
Glucose-Capillary: 52 mg/dL — ABNORMAL LOW (ref 70–99)
Glucose-Capillary: 86 mg/dL (ref 70–99)
Glucose-Capillary: 96 mg/dL (ref 70–99)

## 2023-01-17 LAB — BASIC METABOLIC PANEL
Anion gap: 15 (ref 5–15)
Anion gap: 16 — ABNORMAL HIGH (ref 5–15)
Anion gap: 14 (ref 5–15)
BUN: 36 mg/dL — ABNORMAL HIGH (ref 6–20)
BUN: 36 mg/dL — ABNORMAL HIGH (ref 6–20)
BUN: 31 mg/dL — ABNORMAL HIGH (ref 6–20)
CO2: 23 mmol/L (ref 22–32)
CO2: 23 mmol/L (ref 22–32)
CO2: 24 mmol/L (ref 22–32)
Calcium: 8.3 mg/dL — ABNORMAL LOW (ref 8.9–10.3)
Calcium: 8.5 mg/dL — ABNORMAL LOW (ref 8.9–10.3)
Calcium: 8.4 mg/dL — ABNORMAL LOW (ref 8.9–10.3)
Chloride: 95 mmol/L — ABNORMAL LOW (ref 98–111)
Chloride: 93 mmol/L — ABNORMAL LOW (ref 98–111)
Chloride: 94 mmol/L — ABNORMAL LOW (ref 98–111)
Creatinine, Ser: 1.4 mg/dL — ABNORMAL HIGH (ref 0.61–1.24)
Creatinine, Ser: 1.39 mg/dL — ABNORMAL HIGH (ref 0.61–1.24)
Creatinine, Ser: 1.19 mg/dL (ref 0.61–1.24)
GFR, Estimated: >60 mL/min (ref >60)
GFR, Estimated: >60 mL/min (ref >60)
GFR, Estimated: >60 mL/min (ref >60)
Glucose, Bld: 308 mg/dL — ABNORMAL HIGH (ref 70–99)
Glucose, Bld: 327 mg/dL — ABNORMAL HIGH (ref 70–99)
Glucose, Bld: 156 mg/dL — ABNORMAL HIGH (ref 70–99)
Potassium: 3.8 mmol/L (ref 3.5–5.1)
Potassium: 3.4 mmol/L — ABNORMAL LOW (ref 3.5–5.1)
Potassium: 3.6 mmol/L (ref 3.5–5.1)
Sodium: 133 mmol/L — ABNORMAL LOW (ref 135–145)
Sodium: 132 mmol/L — ABNORMAL LOW (ref 135–145)
Sodium: 132 mmol/L — ABNORMAL LOW (ref 135–145)

## 2023-01-17 LAB — BETA-HYDROXYBUTYRIC ACID: Beta-Hydroxybutyric Acid: 2.4 mmol/L — ABNORMAL HIGH (ref 0.05–0.27)

## 2023-01-17 MED ORDER — SODIUM CHLORIDE 0.9 % IV SOLN
12.5000 mg | Freq: Once | INTRAVENOUS | Status: AC
  Administered 2023-01-17: 12.5 mg via INTRAVENOUS
  Filled 2023-01-17: qty 0.5

## 2023-01-17 MED ORDER — LISINOPRIL 10 MG PO TABS
20.0000 mg | ORAL_TABLET | Freq: Every day | ORAL | Status: DC
  Administered 2023-01-17 – 2023-01-18 (×2): 20 mg via ORAL
  Filled 2023-01-17 (×2): qty 2

## 2023-01-17 MED ORDER — LIDOCAINE HCL URETHRAL/MUCOSAL 2 % EX GEL: 1.0000 | CUTANEOUS | Status: DC | PRN

## 2023-01-17 MED ORDER — INSULIN ASPART 100 UNIT/ML IJ SOLN: 0.0000 [IU] | Freq: Every day | INTRAMUSCULAR | Status: DC

## 2023-01-17 MED ORDER — LIDOCAINE HCL URETHRAL/MUCOSAL 2 % EX GEL
1.0000 | Freq: Once | CUTANEOUS | Status: AC
  Administered 2023-01-17: 1 via URETHRAL
  Filled 2023-01-17: qty 11

## 2023-01-17 MED ORDER — HYDRALAZINE HCL 20 MG/ML IJ SOLN: 10.0000 mg | INTRAMUSCULAR | Status: DC | PRN

## 2023-01-17 MED ORDER — PROMETHAZINE HCL 25 MG/ML IJ SOLN
INTRAMUSCULAR | Status: AC
  Filled 2023-01-17: qty 1

## 2023-01-17 MED ORDER — INSULIN GLARGINE-YFGN 100 UNIT/ML ~~LOC~~ SOLN
36.0000 [IU] | SUBCUTANEOUS | Status: DC
  Administered 2023-01-17: 36 [IU] via SUBCUTANEOUS
  Filled 2023-01-17 (×3): qty 0.36

## 2023-01-17 MED ORDER — POTASSIUM CHLORIDE 2 MEQ/ML IV SOLN: INTRAVENOUS | Status: DC

## 2023-01-17 MED ORDER — INSULIN ASPART 100 UNIT/ML IJ SOLN: 10.0000 [IU] | Freq: Three times a day (TID) | INTRAMUSCULAR | Status: DC

## 2023-01-17 MED ORDER — POTASSIUM CHLORIDE 2 MEQ/ML IV SOLN
INTRAVENOUS | Status: DC
  Filled 2023-01-17 (×2): qty 1000

## 2023-01-17 MED ORDER — INSULIN ASPART 100 UNIT/ML IJ SOLN
0.0000 [IU] | Freq: Three times a day (TID) | INTRAMUSCULAR | Status: DC
  Administered 2023-01-18: 11 [IU] via SUBCUTANEOUS

## 2023-01-17 MED ORDER — LIDOCAINE HCL URETHRAL/MUCOSAL 2 % EX GEL
1.0000 | Freq: Once | CUTANEOUS | Status: AC
  Administered 2023-01-17: 1 via URETHRAL

## 2023-01-17 NOTE — Plan of Care (Signed)

## 2023-01-17 NOTE — Hospital Course (Signed)
37 y.o. male with medical history significant for hypertension, diabetes mellitus.  Patient presented to the ED with complaints of high blood sugars.  He reports he was vomiting all night, blood sugars this morning read high.  He reports he took his insulin last night he does not remember how many units he took.  He reports compliance with his insulin injections over the past 3 weeks.  He has not been using his insulin pump.  Reports lower abdominal pain that is improved since In-N-Out cath was done in the ED. He denies prior problems with voiding.  He currently does not see an endocrinologist, he is on a wait list to establish care with an endocrinologist in [place].   Recent hospitalization 9/7 to 9/10 also for DKA at [facility] in [place] .  Other recent hospitalizations at [facility] in [facility].     ED Course: Tmax 97.7.  Heart rate 123-143.  Tachypneic with respiratory 28-44.  Blood pressure systolic 111-151.  O2 sats greater than 97%.  Blood sugars-1101, with serum bicarb of less than 7, BHB greater than 8, serum osmolality elevated at 379, marked elevated potassium at 7.5.  VBG shows pH of 6.99.  Chest X-ray unremarkable.  CT abdomen and pelvis shows marked distended urinary bladder with left greater than right renal pelvic fullness.  Total of 5 L bolus given.  Calcium gluconate 1 g, sodium bicarb 50 mEq albuterol nebs given.  EDP talked to PCCM Dr. Chestine Spore, recommended giving 0.1 units/kg bolus insulin before starting insulin drip, bicarb pushes to temporize K, repeat labs in 1 hour to call back via [facility], to determine if transfer is still needed.

## 2023-01-17 NOTE — Inpatient Diabetes Management (Addendum)
Inpatient Diabetes Program Recommendations  AACE/ADA: New Consensus Statement on Inpatient Glycemic Control   Target Ranges:  Prepandial:   less than 140 mg/dL      Peak postprandial:   less than 180 mg/dL (1-2 hours)      Critically ill patients:  140 - 180 mg/dL    Latest Reference Range & Units 01/17/23 00:45 01/17/23 01:48 01/17/23 02:53 01/17/23 04:11 01/17/23 05:18  Glucose-Capillary 70 - 99 mg/dL 161 (H) 096 (H) 045 (H) 319 (H) 256 (H)    Latest Reference Range & Units 01/16/23 09:24 01/16/23 11:07  CO2 22 - 32 mmol/L <7 (L) <7 (L)  Glucose 70 - 99 mg/dL 4,098 (HH) 1,191 (HH)  Anion gap 5 - 15  NOT CALCULATED NOT CALCULATED    Latest Reference Range & Units 12/25/22 02:27  Hemoglobin A1C 4.8 - 5.6 % 13.1 (H)   Review of Glycemic Control  Diabetes history: DM1 (does not make any insulin; requires basal, correction, and carbohydrate coverage insulin) Outpatient Diabetes medications: Lantus 40 units daily, Humalog 4 units TID with meals Current orders for Inpatient glycemic control: IV insulin  Inpatient Diabetes Program Recommendations:    Insulin: Once provider is ready to transition from IV to SQ insulin, please consider ordering Semglee 30 units Q24H, CBGs Q4H, Novolog 0-9 units Q4H, and Novolog 4 units TID with meals for meal coverage if patient eats at least 50% of meals.  Outpatient DM: Since patient has no insurance and not able to get [facility] voucher this admission, may need to consider discharging on affordable insulin (Novolin R, Novolin N, and/or Novolin 70/30).  NOTE: Patient has DM1 and admitted with DKA;initial lab glucose 1101 mg/dl on 4/78/29. Patient was recently inpatient 12/25/22-12/27/22 and seen by inpatient diabetes coordinator team x2. Per note on 12/26/22 by diabetes coordinator, patient was using an insulin pump for DM control, had recently moved to [place] from [facility] and lost his job and insurance at the end of August 2024 so he could no longer afford insulin pump  supplies. Per TOC note on 12/27/22, patient was provided with a [facility] voucher for prescribed medications at discharge. Therefore, patient will not be eligible for another [facility] voucher at this time. Will likely need to consider prescribing generic insulins at discharge (Novolin R, Novolin N, and/or Novolin 70/30 which are $25 per bottle or $43 per box of 5 insulin pens at [facility]). Will plan to follow up with patient today.  Addendum 01/17/23@13 :05-Spoke with patient at bedside. Patient states he feels better today than yesterday. Patient reports that he has been using only Humalog for DM control since last discharge on 12/27/22. Patient reports that he did not use the [facility] voucher he was given. Patient states that he has been trying to get more insulin pump supplies so he can get restarted on his insulin pump. He reports that is suppose to be getting new pump supplies any time via mail. Asked if he was paying out of pocket for them and he reports that he thinks he still had insurance when the order was placed but he is not sure yet if he will have to pay anything for the supplies or not. Patient states that he is now living in [place]. Patient states he is going to need help with medication cost. Informed patient that I would ask TOC if he can use the [facility] voucher he has or if he will need a new one. Discussed using Humalog for all insulin needs and that he needs to  have a long acting insulin as well to prevent reoccurrence of DKA.   Informed patient that it would be recommended that he be discharged on SQ insulin regimen in case he is not able to resume his insulin pump. Patient states he has a lot of Humalog insulin at home. Discussed generic insulins at [facility] (Novolin N, Novolin R, and Novolin 70/30) and explained what each insulin is. Informed patient that Novolin insulins are $25 per vial or $43 per box of 5 insulin pens at [facility] and they are available over the counter at [facility]. Patient  states Dr. Laural Benes had mentioned the Novolin insulins but prior to that he did not know anything about them. Patient reports that he has called [facility] multiple times about getting an appointment to re-establish care and he is on a wait list. Patient states he needs to get an Endocrinologist. Informed patient about [facility] and encouraged patient to call and see if he can get an appointment there if he is interested.  Inquired if patient has applied for Medicaid and patient states he has not and would like help with applying.  Informed patient that I would ask TOC about getting someone to assist with applying for Medicaid and clarify if he will need a new MATCH voucher or if he can use the one he has at home. Stressed to patient that he will need to take basal, meal coverage,and correction insulin if he is not able to resume his insulin pump. Reminded patient that it would be requested that he be discharged on SQ insulin regimen and asked that he follow the SQ regimen until he can follow up with Endocrinology or get back on his insulin pump. Provided patient with Reli-On glucose monitoring kit and informed patient that when he gets low of test strips and lancets that he can purchase them at [facility].   Patient verbalized understanding of information discussed and has no questions at this time. Added [facility] contact information and [employer] Product Guide to discharge instructions.  Communicated with Lurena Joiner with TOC and TOC will plan to provide patient with a new MATCH voucher.  Thanks, Orlando Penner, RN, MSN, CDCES Diabetes Coordinator Inpatient Diabetes Program [phone] (Team Pager from 8am to 5pm)

## 2023-01-17 NOTE — Progress Notes (Addendum)
PROGRESS NOTE   Barry Pittman  UJW:[id number] DOB: 01-Nov-1985 DOA: 01/16/2023 PCP: Patient, No Pcp Per   Chief Complaint  Patient presents with   Hyperglycemia   Level of care: Med-Surg  Brief Admission History:  37 y.o. male with medical history significant for hypertension, diabetes mellitus.  Patient presented to the ED with complaints of high blood sugars.  He reports he was vomiting all night, blood sugars this morning read high.  He reports he took his insulin last night he does not remember how many units he took.  He reports compliance with his insulin injections over the past 3 weeks.  He has not been using his insulin pump.  Reports lower abdominal pain that is improved since In-N-Out cath was done in the ED. He denies prior problems with voiding.  He currently does not see an endocrinologist, he is on a wait list to establish care with an endocrinologist in [place].   Recent hospitalization 9/7 to 9/10 also for DKA at [facility] in [place].  Other recent hospitalizations at [facility] in [facility].     ED Course: Tmax 97.7.  Heart rate 123-143.  Tachypneic with respiratory 28-44.  Blood pressure systolic 111-151.  O2 sats greater than 97%.  Blood sugars-1101, with serum bicarb of less than 7, BHB greater than 8, serum osmolality elevated at 379, marked elevated potassium at 7.5.  VBG shows pH of 6.99.  Chest X-ray unremarkable.  CT abdomen and pelvis shows marked distended urinary bladder with left greater than right renal pelvic fullness.  Total of 5 L bolus given.  Calcium gluconate 1 g, sodium bicarb 50 mEq albuterol nebs given.  EDP talked to PCCM Dr. Chestine Spore, recommended giving 0.1 units/kg bolus insulin before starting insulin drip, bicarb pushes to temporize K, repeat labs in 1 hour to call back via [facility], to determine if transfer is still needed.   Assessment and Plan:  Severe Diabetic Ketoacidosis  Uncontrolled type I diabetic, markedly acidotic and hyperglycemic, with VBG  showing pH of 6.99, serum bicarb of less than 7, and calculated anion gap greater than 38, blood glucose of 1101.  Increased serum osmolality of 279.  Mental status is intact. History of noncompliance with insulins.  He supposed be on an insulin pump, but reports since discharge 3 weeks ago he has been using insulin injections. ? Dose.  Does not follow with an endocrinologist, in the process of establishing care. - multiple recent hospitalizations for DKA (I reviewed records in [facility] - EDP consulted PCCM- Insulin bolus 8u given and then drip started, bicarb pushes recommended. -Pt has been maintained on IV insulin infusion for many hours until acidosis corrected and ketones significantly cleared  - at this time nausea and vomiting has resolved and pt feels ready to try a meal - carefully based on IV insulin infusion rates, weight, A1c and hourly CBG readings we have transitioned him to subcutaneous basal bolus supplemental insulin - starting carb modified heart healthy diet - test blood glucose 5 times per day - diabetes coordinator consulted to begin education, instruction, monitoring - TOC consulted to assist with cost of insulin and supplies and outpatient follow up with PCP and endocrinologist  - pt reportedly is relocating to this area permanently - offer a MATCH voucher if he hasn't used one previously  - he should not be on an insulin pump at this time until he has established local care with a PCP and endocrinologist - ambulatory referral to outpatient diabetes and nutrition education  could be helpful   Acute urinary retention 2.5 L was of urine drained from bladder via in/out cath in ED yesterday and nursing attempted foley placement last night but was unsuccessful.  Denies prior urinary problems.  - cath prn if unable to void  - may need to see a urologist if this continues to be an issue, I worry about a neurogenic bladder given many years of poorly controlled diabetes  mellitus - I spoke with Dr Berneice Heinrich on call for urology 3:25pm, noted patient intermittently urinating throughout the day and now RN reporting bladder scan >723ml but pt not wanting to attempt another cath at this time, he said to monitor for now and not to try to force patient to get catheterized, monitor and let symptoms guide, did not feel needed to come cath at this time, he said if symptoms worsened pt might change his mind and want to be catheterized and he could be called at that time.    Essential Hypertension -suboptimally controlled  -added back  home lisinopril now that AKI has resolved  -Resumed carvedilol -IV hydralazine added as needed for elevated BP readings  Type 1 diabetes mellitus with retinopathy, Uncontrolled  As evidenced by A1c of 13.1%.   Leukocytosis -Reactive from severe DKA -no evidence of infection found -follow CBC to be sure this is coming down as DKA resolves   AKI (acute kidney injury) - RESOLVED  Creatinine elevated at 2.17 >> 2.49 on recheck.  Likely prerenal 2/2 dehydration from hyperglycemia and vomiting, and likely postrenal component considering marked urinary retention of 2.5 L, and lisinopril. -Held lisinopril until renal function recovered   Critical Hyperkalemia -physiologic response to severe serum acidosis, lisinopril and AKI -resolved with IV insulin and IV fluid treatment  -Markedly elevated potassium at 7.5.  EKG shows prolonged QTc at 639. -Calcium gluconate 1 g given -Sodium bicarbonate given, and albuterol nebs, continue insulin drip  DVT prophylaxis: SQ heparin  Code Status: Full  Family Communication:  Disposition: home with medically stabilized   Consultants:  Telephone call with PCCM  Procedures:   Antimicrobials:    Subjective: Pt has had some difficulty urinating.  Pt reports he has no further emesis or nausea and would like to try eating a meal.  Pt says he prefers basal bolus with N/R versus 70/30 insulin despite me  telling him it might be easier for him having 2 injections vs 4 injections per day;   Objective: Vitals:   01/17/23 0900 01/17/23 1000 01/17/23 1100 01/17/23 1200  BP: (!) 144/83 129/68 137/74 133/77  Pulse: 99 94 92 94  Resp: 13 18 16 18   Temp:    97.7 F (36.5 C)  TempSrc:    Oral  SpO2: 96% 97% 99% 96%  Weight:      Height:        Intake/Output Summary (Last 24 hours) at 01/17/2023 1424 Last data filed at 01/17/2023 1408 Gross per 24 hour  Intake 4402.11 ml  Output 850 ml  Net 3552.11 ml   Filed Weights   01/16/23 0855 01/16/23 1820  Weight: 86.2 kg 88.6 kg   Examination:  General exam: Appears calm and comfortable  Respiratory system: Clear to auscultation. Respiratory effort normal. Cardiovascular system: tachycardic rate; normal S1 & S2 heard. No JVD, murmurs, rubs, gallops or clicks. No pedal edema. Gastrointestinal system: Abdomen is nondistended, soft and nontender. No organomegaly or masses felt. Normal bowel sounds heard. Central nervous system: Alert and oriented. No focal neurological deficits. Extremities: Symmetric 5  x 5 power. Skin: No rashes, lesions or ulcers. Psychiatry: Judgement and insight appear normal. Mood & affect appropriate.   Data Reviewed: I have personally reviewed following labs and imaging studies  CBC: Recent Labs  Lab 01/16/23 0924 01/17/23 0901  WBC 23.9* 14.0*  NEUTROABS 19.6* 10.8*  HGB 14.6 11.4*  HCT 50.4 32.9*  MCV 100.4* 87.3  PLT 526* 325    Basic Metabolic Panel: Recent Labs  Lab 01/16/23 0924 01/16/23 1107 01/16/23 1524 01/16/23 2035 01/17/23 0113 01/17/23 0456 01/17/23 0901  NA 127*   < > 137 137 133* 132* 132*  K >7.5*   < > 4.1 4.0 3.8 3.4* 3.6  CL 82*   < > 93* 95* 95* 93* 94*  CO2 <7*   < > <7* 15* 23 23 24   GLUCOSE 1,101*   < > 581* 230* 308* 327* 156*  BUN 39*   < > 41* 36* 36* 36* 31*  CREATININE 2.17*   < > 2.49* 1.90* 1.40* 1.39* 1.19  CALCIUM 9.2   < > 9.1 8.8* 8.3* 8.5* 8.4*  MG 2.7*  --   --    --   --   --   --    < > = values in this interval not displayed.    CBG: Recent Labs  Lab 01/17/23 0823 01/17/23 0921 01/17/23 1121 01/17/23 1224 01/17/23 1349  GLUCAP 177* 154* 110* 144* 105*    Recent Results (from the past 240 hour(s))  MRSA Next Gen by PCR, Nasal     Status: None   Collection Time: 01/16/23  6:30 PM   Specimen: Nasal Mucosa; Nasal Swab  Result Value Ref Range Status   MRSA by PCR Next Gen NOT DETECTED NOT DETECTED Final    Comment: (NOTE) The GeneXpert MRSA Assay (FDA approved for NASAL specimens only), is one component of a comprehensive MRSA colonization surveillance program. It is not intended to diagnose MRSA infection nor to guide or monitor treatment for MRSA infections. Test performance is not FDA approved in patients less than 18 years old. Performed at [facility], [address], [place], [place] [address]      Radiology Studies: CT CHEST ABDOMEN PELVIS WO CONTRAST  Result Date: 01/16/2023 CLINICAL DATA:  Hyperglycemia with tachycardia, tachypnea, and emesis EXAM: CT CHEST, ABDOMEN AND PELVIS WITHOUT CONTRAST TECHNIQUE: Multidetector CT imaging of the chest, abdomen and pelvis was performed following the standard protocol without IV contrast. RADIATION DOSE REDUCTION: This exam was performed according to the departmental dose-optimization program which includes automated exposure control, adjustment of the mA and/or kV according to patient size and/or use of iterative reconstruction technique. COMPARISON:  CT chest, abdomen, pelvis dated 09/22/2016 FINDINGS: CT CHEST FINDINGS Decreased sensitivity and specificity for detailed findings due to motion artifact. Cardiovascular: Normal heart size. No significant pericardial fluid/thickening. Great vessels are normal in course and caliber. Mediastinum/Nodes: Imaged thyroid gland without nodules meeting criteria for imaging follow-up by size. Normal esophagus. No pathologically enlarged axillary,  supraclavicular, mediastinal, or hilar lymph nodes. Lungs/Pleura: The central airways are patent. Unchanged 3 mm right middle lobe nodule (3:66). No specific follow-up imaging recommended. No pneumothorax. No pleural effusion. Musculoskeletal: No acute or abnormal lytic or blastic osseous lesions. Mild anterior wedging of T11 and T12, unchanged. CT ABDOMEN PELVIS FINDINGS Hepatobiliary: No focal hepatic lesions. No intra or extrahepatic biliary ductal dilation. Normal gallbladder. Pancreas: No focal lesions or main ductal dilation. Spleen: Normal in size without focal abnormality. Adrenals/Urinary Tract: No adrenal nodules. No suspicious  renal mass on this noncontrast enhanced examination, calculi, or hydronephrosis. Left-greater-than-right renal pelvic fullness. Markedly distended urinary bladder. No focal mural thickening. Stomach/Bowel: Normal appearance of the stomach. No evidence of bowel wall thickening, distention, or inflammatory changes. Normal appendix. Vascular/Lymphatic: No significant vascular findings are present. Retroaortic left renal vein. No enlarged abdominal or pelvic lymph nodes. Reproductive: Prostate is unremarkable. Calcification of the vas deferens. Other: No free fluid, fluid collection, or free air. Musculoskeletal: No acute or abnormal lytic or blastic osseous findings. IMPRESSION: 1. Markedly distended urinary bladder with left-greater-than-right renal pelvic fullness. 2. Otherwise, no acute findings in the chest, abdomen, or pelvis. Electronically Signed   By: Agustin Cree M.D.   On: 01/16/2023 15:07   DG Chest Portable 1 View  Result Date: 01/16/2023 CLINICAL DATA:  Chest pain.  Shortness of breath. EXAM: PORTABLE CHEST 1 VIEW COMPARISON:  09/21/2016. FINDINGS: Low lung volume. Bilateral lung fields are clear. Bilateral lateral costophrenic angles are clear. Normal cardio-mediastinal silhouette. No acute osseous abnormalities. The soft tissues are within normal limits. IMPRESSION: 1.  Low lung volume. No active disease. Electronically Signed   By: Jules Schick M.D.   On: 01/16/2023 10:09    Scheduled Meds:  carvedilol  3.125 mg Oral BID WC   Chlorhexidine Gluconate Cloth  6 each Topical Daily   heparin  5,000 Units Subcutaneous Q8H   insulin aspart  0-15 Units Subcutaneous TID WC   insulin aspart  0-5 Units Subcutaneous QHS   insulin aspart  10 Units Subcutaneous TID WC   insulin glargine-yfgn  36 Units Subcutaneous Q24H   lisinopril  20 mg Oral Daily   melatonin  6 mg Oral QHS   Continuous Infusions:  lactated ringers 1,000 mL with potassium chloride 20 mEq infusion 75 mL/hr at 01/17/23 1408     LOS: 1 day   Critical Care Procedure Note Authorized and Performed by: Maryln Manuel MD  Total Critical Care time:  67 mins Due to a high probability of clinically significant, life threatening deterioration, the patient required my highest level of preparedness to intervene emergently and I personally spent this critical care time directly and personally managing the patient.  This critical care time included obtaining a history; examining the patient, pulse oximetry; ordering and review of studies; arranging urgent treatment with development of a management plan; evaluation of patient's response of treatment; frequent reassessment; and discussions with other providers.  This critical care time was performed to assess and manage the high probability of imminent and life threatening deterioration that could result in multi-organ failure.  It was exclusive of separately billable procedures and treating other patients and teaching time.    Standley Dakins, MD How to contact the [facility] Attending or Consulting provider 7A - 7P or covering provider during after hours 7P -7A, for this patient?  Check the care team in [facility] and look for a) attending/consulting TRH provider listed and b) the [facility] team listed Log into [email or web address] and use [facility]'s universal password to access. If you  do not have the password, please contact the hospital operator. Locate the [facility] provider you are looking for under [facility] and page to a number that you can be directly reached. If you still have difficulty reaching the provider, please page the [facility] (Director on Call) for the Hospitalists listed on amion for assistance.  01/17/2023, 2:25 PM

## 2023-01-17 NOTE — Discharge Instructions (Signed)
May want to call [facility] to ask about getting established there.   [facility] [address] [place],  [place]  [address] Main: [phone]

## 2023-01-17 NOTE — Plan of Care (Signed)

## 2023-01-17 NOTE — TOC Initial Note (Addendum)
Transition of Care [facility]) - Initial/Assessment Note    Patient Details  Name: Barry Pittman MRN: [id number] Date of Birth: 1985/07/19  Transition of Care [facility]) CM/SW Contact:    Leitha Bleak, RN Phone Number: 01/17/2023, 1:56 PM  Clinical Narrative:    Patient admitted with DKA,  recently inpt 9/8-9/10/24 and got a MATCH voucher. Patient did not use it MATCH letter he was given. Pt is going to need medication assistance . CM consulted supervisor. Approval  given to give another [facility] voucher at discharge.            Patient is under review with [employer] for medicaid.    Expected Discharge Plan: Home/Self Care Barriers to Discharge: Continued Medical Work up   Expected Discharge Plan and Services      Living arrangements for the past 2 months: Apartment                    Prior Living Arrangements/Services Living arrangements for the past 2 months: Apartment Lives with:: Self            Activities of Daily Living   ADL Screening (condition at time of admission) Does the patient have a NEW difficulty with bathing/dressing/toileting/self-feeding that is expected to last >3 days?: Yes (Initiates electronic notice to provider for possible OT consult) Does the patient have a NEW difficulty with getting in/out of bed, walking, or climbing stairs that is expected to last >3 days?: Yes (Initiates electronic notice to provider for possible PT consult) Does the patient have a NEW difficulty with communication that is expected to last >3 days?: No Is the patient deaf or have difficulty hearing?: No Does the patient have difficulty seeing, even when wearing glasses/contacts?: No Does the patient have difficulty concentrating, remembering, or making decisions?: No  Permission Sought/Granted      Admission diagnosis:  Hyperkalemia [E87.5] Urine retention [R33.9] DKA (diabetic ketoacidosis) (HCC) [E11.10] AKI (acute kidney injury) (HCC) [N17.9] Diabetic  ketoacidosis without coma associated with type 1 diabetes mellitus (HCC) [E10.10] Patient Active Problem List   Diagnosis Date Noted   Acute urinary retention 01/16/2023   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 [place], [place] - [address] AT [facility] [address] [place] [address] Phone: [phone] Fax: [phone]  [facility] [address] [place] [address] Phone: [phone] Fax: [phone]  [facility] [address] [place], [place] - [address] [place]. AT [facility] [address] [place]. [place] [address] Phone: [phone] Fax: [phone]     Social Determinants of Health (SDOH) Social History: SDOH Screenings   Food Insecurity: No Food Insecurity (01/16/2023)  Housing: Low Risk  (01/16/2023)  Transportation Needs: No Transportation Needs (01/16/2023)  Utilities: Not At Risk (01/16/2023)  Social Connections: Moderately Isolated (12/26/2022)  Tobacco Use: Low Risk  (01/16/2023)  Recent Concern: Tobacco Use - High Risk (11/05/2022)  Received from [facility] [place] [facility] [place]  Health Literacy: Adequate Health Literacy (12/26/2022)   SDOH Interventions:    Readmission Risk Interventions    12/26/2022    3:14 PM  Readmission Risk Prevention Plan  Post Dischage Appt Complete  Medication Screening Complete  Transportation  Screening Complete

## 2023-01-18 ENCOUNTER — Telehealth: Payer: Self-pay | Admitting: Nurse Practitioner

## 2023-01-18 LAB — CBC WITH DIFFERENTIAL/PLATELET
Abs Immature Granulocytes: 0.04 10*3/uL (ref 0.00–0.07)
Basophils Absolute: 0 10*3/uL (ref 0.0–0.1)
Basophils Relative: 0 %
Eosinophils Absolute: 0 10*3/uL (ref 0.0–0.5)
Eosinophils Relative: 0 %
HCT: 34.6 % — ABNORMAL LOW (ref 39.0–52.0)
Hemoglobin: 11.4 g/dL — ABNORMAL LOW (ref 13.0–17.0)
Immature Granulocytes: 0 %
Lymphocytes Relative: 24 %
Lymphs Abs: 2.2 10*3/uL (ref 0.7–4.0)
MCH: 29.8 pg (ref 26.0–34.0)
MCHC: 32.9 g/dL (ref 30.0–36.0)
MCV: 90.3 fL (ref 80.0–100.0)
Monocytes Absolute: 0.8 10*3/uL (ref 0.1–1.0)
Monocytes Relative: 8 %
Neutro Abs: 6.2 10*3/uL (ref 1.7–7.7)
Neutrophils Relative %: 68 %
Platelets: 270 10*3/uL (ref 150–400)
RBC: 3.83 MIL/uL — ABNORMAL LOW (ref 4.22–5.81)
RDW: 14.2 % (ref 11.5–15.5)
WBC: 9.2 10*3/uL (ref 4.0–10.5)
nRBC: 0 % (ref 0.0–0.2)

## 2023-01-18 LAB — BASIC METABOLIC PANEL
Anion gap: 7 (ref 5–15)
BUN: 16 mg/dL (ref 6–20)
CO2: 30 mmol/L (ref 22–32)
Calcium: 8.4 mg/dL — ABNORMAL LOW (ref 8.9–10.3)
Chloride: 96 mmol/L — ABNORMAL LOW (ref 98–111)
Creatinine, Ser: 0.79 mg/dL (ref 0.61–1.24)
GFR, Estimated: >60 mL/min (ref >60)
Glucose, Bld: 242 mg/dL — ABNORMAL HIGH (ref 70–99)
Potassium: 3.5 mmol/L (ref 3.5–5.1)
Sodium: 133 mmol/L — ABNORMAL LOW (ref 135–145)

## 2023-01-18 LAB — MAGNESIUM: Magnesium: 2.2 mg/dL (ref 1.7–2.4)

## 2023-01-18 LAB — GLUCOSE, CAPILLARY
Glucose-Capillary: 152 mg/dL — ABNORMAL HIGH (ref 70–99)
Glucose-Capillary: 335 mg/dL — ABNORMAL HIGH (ref 70–99)

## 2023-01-18 MED ORDER — BLOOD GLUCOSE TEST VI STRP: 1.0000 | ORAL_STRIP | Freq: Three times a day (TID) | 0 refills | Status: AC

## 2023-01-18 MED ORDER — LANCETS MISC: 1.0000 | Freq: Three times a day (TID) | 0 refills | Status: AC

## 2023-01-18 MED ORDER — INSULIN ASPART PROT & ASPART (70-30 MIX) 100 UNIT/ML ~~LOC~~ SUSP
20.0000 [IU] | Freq: Two times a day (BID) | SUBCUTANEOUS | Status: DC
  Administered 2023-01-18: 20 [IU] via SUBCUTANEOUS
  Filled 2023-01-18: qty 10

## 2023-01-18 MED ORDER — INSULIN ASPART 100 UNIT/ML IJ SOLN: 4.0000 [IU] | Freq: Three times a day (TID) | INTRAMUSCULAR | Status: DC

## 2023-01-18 MED ORDER — LANCET DEVICE MISC: 1.0000 | Freq: Three times a day (TID) | 0 refills | Status: AC

## 2023-01-18 MED ORDER — PEN NEEDLES 31G X 5 MM MISC: 1 refills | Status: AC

## 2023-01-18 MED ORDER — INSULIN ISOPHANE & REGULAR (HUMAN 70-30)100 UNIT/ML KWIKPEN: 20.0000 [IU] | PEN_INJECTOR | Freq: Two times a day (BID) | SUBCUTANEOUS | 11 refills | Status: DC

## 2023-01-18 MED ORDER — PEN NEEDLES 31G X 5 MM MISC: 1.0000 | Freq: Three times a day (TID) | 0 refills | Status: AC

## 2023-01-18 MED ORDER — BLOOD GLUCOSE MONITORING SUPPL DEVI: 1.0000 | Freq: Three times a day (TID) | 0 refills | Status: AC

## 2023-01-18 MED ORDER — INSULIN ASPART PROT & ASPART (70-30 MIX) 100 UNIT/ML PEN: 20.0000 [IU] | PEN_INJECTOR | Freq: Two times a day (BID) | SUBCUTANEOUS | 0 refills | Status: DC

## 2023-01-18 MED ORDER — INSULIN GLARGINE-YFGN 100 UNIT/ML ~~LOC~~ SOLN
30.0000 [IU] | SUBCUTANEOUS | Status: DC
  Filled 2023-01-18 (×2): qty 0.3

## 2023-01-18 MED ORDER — NOVOLIN 70/30 FLEXPEN RELION (70-30) 100 UNIT/ML ~~LOC~~ SUPN: 20.0000 [IU] | PEN_INJECTOR | Freq: Two times a day (BID) | SUBCUTANEOUS | 0 refills | Status: DC

## 2023-01-18 NOTE — Telephone Encounter (Addendum)
Call to pt left Vm - if he calls back, schedule him on 03/06/23 at 2pm ( it will be an overbook )

## 2023-01-18 NOTE — TOC CM/SW Note (Signed)
MATCH MEDICATION ASSISTANCE CARD Pharmacies please call [phone] for claim processing assistance.  Rx BIN: [id number] Rx Group: [id number] Rx PCN: [id number] Relationship Code: 1 Person Code: [id number]  Patient ID (MRN): [id number]    Patient Name: Barry Pittman   Patient DOB: [birth year]   Discharge Date: 01/18/23  Expiration Date: 01/25/23 (must be filled within 7 days of discharge)   Dear Mr Closser, You have been approved to have the prescriptions written by your discharging physician filled through our [facility] (Medication Assistance Through [facility]) program. This program allows for a one-time (no refills) 34-day supply of selected medications for a low copay amount.  The copay is $3.00 per prescription. For instance, if you have one prescription, you will pay $3.00; for two prescriptions, you pay $6.00; for three prescriptions, you pay $9.00; and so on. Only certain pharmacies are participating in this program with [facility]. You will need to select one of the pharmacies from the attached lists and take your prescriptions, this letter, and your photo ID to one of the participating pharmacies.  We are excited that you are able to use the [facility] program to get your medications. These prescriptions must be filled within 7 days of hospital discharge or they will no longer be valid for the [facility] program. Should you have any problems with your prescriptions please contact your case management team member at [phone] for [facility] or [phone] for [facility].  Thank you, [facility]

## 2023-01-18 NOTE — TOC Transition Note (Signed)
Transition of Care [facility]) - CM/SW Discharge Note   Patient Details  Name: ARVIL UTZ MRN: [id number] Date of Birth: [birth year]  Transition of Care [facility]) CM/SW Contact:  Erin Sons, LCSW Phone Number: 01/18/2023, 11:26 AM   Clinical Narrative:     CSW met with pt to discuss MATCH program. He is able to afford 3$ copay. He also requests information about medicaid. CSW explained pt can apply for medicaid through DSS and provided that info on the AVS. CSW also listed a community PCP clinic on AVS.   Final next level of care: Home/Self Care Barriers to Discharge: No Barriers Identified  Discharge Plan and Services Additional resources added to the After Visit Summary for                                       Social Determinants of Health (SDOH) Interventions SDOH Screenings   Food Insecurity: No Food Insecurity (01/16/2023)  Housing: Low Risk  (01/16/2023)  Transportation Needs: No Transportation Needs (01/16/2023)  Utilities: Not At Risk (01/16/2023)  Social Connections: Moderately Isolated (12/26/2022)  Tobacco Use: Low Risk  (01/16/2023)  Recent Concern: Tobacco Use - High Risk (11/05/2022)   Received from [facility] Literacy: Adequate Health Literacy (12/26/2022)     Readmission Risk Interventions    12/26/2022    3:14 PM  Readmission Risk Prevention Plan  Post Dischage Appt Complete  Medication Screening Complete  Transportation Screening Complete

## 2023-01-18 NOTE — Discharge Summary (Signed)
Physician Discharge Summary  Barry Pittman:[id number] DOB: 12-16-1985 DOA: 01/16/2023  PCP: Patient, No Pcp Per  Admit date: 01/16/2023  Discharge date: 01/18/2023  Admitted From:Home  Disposition:  Home  Recommendations for Outpatient Follow-up:  Follow up with PCP in 1-2 weeks Follow-up with endocrinology with referral sent for diabetes management Follow-up with urology with referral sent for evaluation of neurogenic bladder and issues with retention Continue on Novolin 70/30 20 units twice daily Continue to monitor blood glucose carefully at home  Home Health: None  Equipment/Devices: None  Discharge Condition:Stable  CODE STATUS: Full  Diet recommendation: Heart Healthy/carb modified  Brief/Interim Summary: 37 y.o. male with medical history significant for hypertension, diabetes mellitus.  Patient presented to the ED with complaints of high blood sugars.  He reports he was vomiting all night, blood sugars this morning read high.  Patient was admitted with severe diabetic ketoacidosis and received treatment with IV fluids and insulin with resolution noted.  He needs close follow-up with endocrinology outpatient and adjustment to his home insulin regimen to carefully monitor at home.  He was also noted to have acute urinary retention likely related to neurogenic bladder, but is now voiding successfully.  He does continue to have some mild retention and it has been recommended that he follow-up with urology outpatient.  No other acute events or concerns noted at this time.  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  Discharge Instructions  Discharge Instructions     Ambulatory referral to Endocrinology   Complete by: As directed    Ambulatory referral to Urology   Complete by: As directed    Diabetes Coordination Discharge Recommendations   Complete by: As directed    Diet - low sodium heart healthy   Complete by: As directed    Increase activity slowly   Complete by: As directed       Allergies as of 01/18/2023       Reactions   Insulin Aspart Other (See Comments)   "goes into DKA" Can take Humalog but not Novolog        Medication List     TAKE these medications    accu-chek soft touch lancets Use as directed. What changed: Another medication with the same name was added. Make sure you understand how and when to take each.   Lancets Misc 1 each by Does not apply route 3 (three) times daily. Use as directed to check blood sugar. May dispense any manufacturer covered by patient's insurance and fits patient's device. What changed: You were already taking a medication with the same name, and this prescription was added. Make sure you understand how and when to take each.   blood glucose meter kit and supplies Kit Dispense based on patient and insurance preference. Use up to four times daily as directed. (FOR ICD-9 250.00, 250.01).   Blood Glucose Monitoring Suppl Devi 1 each by Does not apply route 3 (three) times daily. May dispense any manufacturer covered by patient's insurance.   BLOOD GLUCOSE TEST STRIPS Strp 1 each by Does not apply route  3 (three) times daily. Use as directed to check blood sugar. May dispense any manufacturer covered by patient's insurance and fits patient's device.   Gvoke HypoPen 1-Pack 1 MG/0.2ML Soaj Generic drug: Glucagon Inject 1 mg as directed daily as needed for up to 1 dose (low blood sugar).   HumaLOG 100 UNIT/ML injection Generic drug: insulin lispro Inject 0.86 mLs (86  Units total) into the skin as directed. VIA PUMP   insulin glargine 100 UNIT/ML injection Commonly known as: LANTUS Inject 0.4 mLs (40 Units total) into the skin daily. Take this daily OR use the insulin pump. DO NOT USE BOTH SIMULTANEOUSLY.   Insulin Pump Accessories Misc 1 each by Does not apply route daily. Patient requires Mini-Med Infusion set   insulin pump Soln Inject 1 each into the skin as directed.   Lancet Device Misc 1 each by Does not apply route 3 (three) times daily. May dispense any manufacturer covered by patient's insurance.   lisinopril 20 MG tablet Commonly known as: ZESTRIL Take 1 tablet (20 mg total) by mouth daily.   Nitrostat 0.4 MG SL tablet Generic drug: nitroGLYCERIN Place 0.4 mg under the tongue every 5 (five) minutes as needed for chest pain.   NovoLIN 70/30 Kwikpen (70-30) 100 UNIT/ML KwikPen Generic drug: insulin isophane & regular human KwikPen Inject 20 Units into the skin 2 (two) times daily with a meal.   Pen Needles 31G X 5 MM Misc 1 each by Does not apply route 3 (three) times daily. May dispense any manufacturer covered by patient's insurance.   tadalafil 20 MG tablet Commonly known as: CIALIS Take 1 tablet by mouth daily.        Follow-up Information     pcp. Schedule an appointment as soon as possible for a visit.          [facility]. Go to.   Specialty: Endocrinology Contact information: [address] [place] [address] [phone]        [facility] [place]. Go to.   Specialty: Urology Contact information: [address] [place] [address] [phone]               Allergies  Allergen Reactions   Insulin Aspart Other (See Comments)    "goes into DKA" Can take Humalog but not Novolog    Consultations: None   Procedures/Studies: CT CHEST ABDOMEN PELVIS WO CONTRAST  Result Date: 01/16/2023 CLINICAL  DATA:  Hyperglycemia with tachycardia, tachypnea, and emesis EXAM: CT CHEST, ABDOMEN AND PELVIS WITHOUT CONTRAST TECHNIQUE: Multidetector CT imaging of the chest, abdomen and pelvis was performed following the standard protocol without IV contrast. RADIATION DOSE REDUCTION: This exam was performed according to the departmental dose-optimization program which includes automated exposure control, adjustment of the mA and/or kV according to patient size and/or use of iterative reconstruction technique. COMPARISON:  CT chest, abdomen, pelvis dated 09/22/2016 FINDINGS: CT CHEST FINDINGS Decreased sensitivity and specificity for detailed findings due to motion artifact. Cardiovascular: Normal heart size. No significant pericardial fluid/thickening. Great vessels are normal in course and caliber. Mediastinum/Nodes: Imaged thyroid gland without nodules meeting criteria for imaging follow-up by size. Normal esophagus. No pathologically enlarged axillary, supraclavicular, mediastinal, or hilar lymph nodes. Lungs/Pleura: The central airways are patent. Unchanged 3 mm right middle lobe nodule (3:66). No specific follow-up imaging recommended. No pneumothorax. No pleural effusion. Musculoskeletal: No acute or abnormal lytic or blastic osseous lesions. Mild anterior wedging of T11 and T12, unchanged. CT ABDOMEN  PELVIS FINDINGS Hepatobiliary: No focal hepatic lesions. No intra or extrahepatic biliary ductal dilation. Normal gallbladder. Pancreas: No focal lesions or main ductal dilation. Spleen: Normal in size without focal abnormality. Adrenals/Urinary Tract: No adrenal nodules. No suspicious renal mass on this noncontrast enhanced examination, calculi, or hydronephrosis. Left-greater-than-right renal pelvic fullness. Markedly distended urinary bladder. No focal mural thickening. Stomach/Bowel: Normal appearance of the stomach. No evidence of bowel wall thickening, distention, or inflammatory changes. Normal appendix.  Vascular/Lymphatic: No significant vascular findings are present. Retroaortic left renal vein. No enlarged abdominal or pelvic lymph nodes. Reproductive: Prostate is unremarkable. Calcification of the vas deferens. Other: No free fluid, fluid collection, or free air. Musculoskeletal: No acute or abnormal lytic or blastic osseous findings. IMPRESSION: 1. Markedly distended urinary bladder with left-greater-than-right renal pelvic fullness. 2. Otherwise, no acute findings in the chest, abdomen, or pelvis. Electronically Signed   By: Agustin Cree M.D.   On: 01/16/2023 15:07   DG Chest Portable 1 View  Result Date: 01/16/2023 CLINICAL DATA:  Chest pain.  Shortness of breath. EXAM: PORTABLE CHEST 1 VIEW COMPARISON:  09/21/2016. FINDINGS: Low lung volume. Bilateral lung fields are clear. Bilateral lateral costophrenic angles are clear. Normal cardio-mediastinal silhouette. No acute osseous abnormalities. The soft tissues are within normal limits. IMPRESSION: 1. Low lung volume. No active disease. Electronically Signed   By: Jules Schick M.D.   On: 01/16/2023 10:09     Discharge Exam: Vitals:   01/17/23 1937 01/18/23 0255  BP: 130/69 (!) 140/76  Pulse: 89 79  Resp: 18 18  Temp: 98.8 F (37.1 C) 98.8 F (37.1 C)  SpO2: 98% 98%   Vitals:   01/17/23 1614 01/17/23 1705 01/17/23 1937 01/18/23 0255  BP:  (!) 149/86 130/69 (!) 140/76  Pulse:  86 89 79  Resp:  18 18 18   Temp: 98.2 F (36.8 C) 98.9 F (37.2 C) 98.8 F (37.1 C) 98.8 F (37.1 C)  TempSrc: Oral Oral Oral Oral  SpO2:  97% 98% 98%  Weight:      Height:        General: Pt is alert, awake, not in acute distress Cardiovascular: RRR, S1/S2 +, no rubs, no gallops Respiratory: CTA bilaterally, no wheezing, no rhonchi Abdominal: Soft, NT, ND, bowel sounds + Extremities: no edema, no cyanosis    The results of significant diagnostics from this hospitalization (including imaging, microbiology, ancillary and laboratory) are listed below  for reference.     Microbiology: Recent Results (from the past 240 hour(s))  MRSA Next Gen by PCR, Nasal     Status: None   Collection Time: 01/16/23  6:30 PM   Specimen: Nasal Mucosa; Nasal Swab  Result Value Ref Range Status   MRSA by PCR Next Gen NOT DETECTED NOT DETECTED Final    Comment: (NOTE) The GeneXpert MRSA Assay (FDA approved for NASAL specimens only), is one component of a comprehensive MRSA colonization surveillance program. It is not intended to diagnose MRSA infection nor to guide or monitor treatment for MRSA infections. Test performance is not FDA approved in patients less than 42 years old. Performed at [facility], [address] [place], [place] [address]      Labs: BNP (last 3 results) No results for input(s): "BNP" in the last 8760 hours. Basic Metabolic Panel: Recent Labs  Lab 01/16/23 0924 01/16/23 1107 01/16/23 2035 01/17/23 0113 01/17/23 0456 01/17/23 0901 01/18/23 0448  NA 127*   < > 137 133* 132* 132* 133*  K >7.5*   < >  4.0 3.8 3.4* 3.6 3.5  CL 82*   < > 95* 95* 93* 94* 96*  CO2 <7*   < > 15* 23 23 24 30   GLUCOSE 1,101*   < > 230* 308* 327* 156* 242*  BUN 39*   < > 36* 36* 36* 31* 16  CREATININE 2.17*   < > 1.90* 1.40* 1.39* 1.19 0.79  CALCIUM 9.2   < > 8.8* 8.3* 8.5* 8.4* 8.4*  MG 2.7*  --   --   --   --   --  2.2   < > = values in this interval not displayed.   Liver Function Tests: No results for input(s): "AST", "ALT", "ALKPHOS", "BILITOT", "PROT", "ALBUMIN" in the last 168 hours. Recent Labs  Lab 01/16/23 0924  LIPASE 24   No results for input(s): "AMMONIA" in the last 168 hours. CBC: Recent Labs  Lab 01/16/23 0924 01/17/23 0901 01/18/23 0448  WBC 23.9* 14.0* 9.2  NEUTROABS 19.6* 10.8* 6.2  HGB 14.6 11.4* 11.4*  HCT 50.4 32.9* 34.6*  MCV 100.4* 87.3 90.3  PLT 526* 325 270   Cardiac Enzymes: No results for input(s): "CKTOTAL", "CKMB", "CKMBINDEX", "TROPONINI" in the last 168 hours. BNP: Invalid input(s):  "POCBNP" CBG: Recent Labs  Lab 01/17/23 1610 01/17/23 1939 01/17/23 2215 01/18/23 0252 01/18/23 0735  GLUCAP 96 52* 86 152* 335*   D-Dimer No results for input(s): "DDIMER" in the last 72 hours. Hgb A1c No results for input(s): "HGBA1C" in the last 72 hours. Lipid Profile No results for input(s): "CHOL", "HDL", "LDLCALC", "TRIG", "CHOLHDL", "LDLDIRECT" in the last 72 hours. Thyroid function studies No results for input(s): "TSH", "T4TOTAL", "T3FREE", "THYROIDAB" in the last 72 hours.  Invalid input(s): "FREET3" Anemia work up No results for input(s): "VITAMINB12", "FOLATE", "FERRITIN", "TIBC", "IRON", "RETICCTPCT" in the last 72 hours. Urinalysis    Component Value Date/Time   COLORURINE YELLOW 12/25/2022 0101   APPEARANCEUR CLEAR 12/25/2022 0101   LABSPEC 1.026 12/25/2022 0101   PHURINE 5.0 12/25/2022 0101   GLUCOSEU >=500 (A) 12/25/2022 0101   HGBUR SMALL (A) 12/25/2022 0101   BILIRUBINUR NEGATIVE 12/25/2022 0101   KETONESUR 80 (A) 12/25/2022 0101   PROTEINUR 30 (A) 12/25/2022 0101   NITRITE NEGATIVE 12/25/2022 0101   LEUKOCYTESUR NEGATIVE 12/25/2022 0101   Sepsis Labs Recent Labs  Lab 01/16/23 0924 01/17/23 0901 01/18/23 0448  WBC 23.9* 14.0* 9.2   Microbiology Recent Results (from the past 240 hour(s))  MRSA Next Gen by PCR, Nasal     Status: None   Collection Time: 01/16/23  6:30 PM   Specimen: Nasal Mucosa; Nasal Swab  Result Value Ref Range Status   MRSA by PCR Next Gen NOT DETECTED NOT DETECTED Final    Comment: (NOTE) The GeneXpert MRSA Assay (FDA approved for NASAL specimens only), is one component of a comprehensive MRSA colonization surveillance program. It is not intended to diagnose MRSA infection nor to guide or monitor treatment for MRSA infections. Test performance is not FDA approved in patients less than 1 years old. Performed at [facility], [address] [place], [place] [address]      Time coordinating discharge: 35  minutes  SIGNED:   Erick Blinks, DO [facility] 01/18/2023, 10:25 AM  If 7PM-7AM, please contact night-coverage [email or web address]

## 2023-01-18 NOTE — Inpatient Diabetes Management (Addendum)
Inpatient Diabetes Program Recommendations  AACE/ADA: New Consensus Statement on Inpatient Glycemic Control  Target Ranges:  Prepandial:   less than 140 mg/dL      Peak postprandial:   less than 180 mg/dL (1-2 hours)      Critically ill patients:  140 - 180 mg/dL    Latest Reference Range & Units 01/18/23 02:52 01/18/23 07:35  Glucose-Capillary 70 - 99 mg/dL 960 (H) 454 (H)    Latest Reference Range & Units 01/17/23 07:21 01/17/23 08:23 01/17/23 09:21 01/17/23 11:21 01/17/23 12:24 01/17/23 13:49 01/17/23 16:10 01/17/23 19:39 01/17/23 22:15  Glucose-Capillary 70 - 99 mg/dL 098 (H) 119 (H) 147 (H) 110 (H) 144 (H) 105 (H) 96 52 (L) 86   Review of Glycemic Control  Diabetes history: DM1 (does not make any insulin; requires basal, correction, and carbohydrate coverage insulin) Outpatient Diabetes medications: Lantus 40 units daily, Humalog 4 units TID with meals Current orders for Inpatient glycemic control:Semglee 36 units Q24H, Novolog 0-15 units TID with meals, Novolog 0-5 units at bedtime, Novolog 10 units TID with meals   Inpatient Diabetes Program Recommendations:     Insulin: Glucose down to 52 mg/dl at 82:95 on 62/1/30 and up to 335 mg/dl at 8:65 am today.   Please consider decreasing Semglee to 30 units Q24H, Novolog correction to 0-9 units TID, and meal coverage to Novolog 4 units TID with meals for meal coverage if patient eats at least 50% of meals.   Outpatient DM: Since patient has no insurance and may need to consider discharging on affordable insulin (Novolin R, Novolin N, and/or Novolin 70/30). Would recommend to discharge on Novolin 70/30 20 units BID (would provide a total of 28 units for basal and 12 units for meal coverage per day).   Thanks, Orlando Penner, RN, MSN, CDCES Diabetes Coordinator Inpatient Diabetes Program [phone] (Team Pager from 8am to 5pm)

## 2023-01-18 NOTE — Telephone Encounter (Signed)
Do you want me to schedule this pt sooner than your next available due to DKA multiple times in the last few months?

## 2023-01-18 NOTE — Telephone Encounter (Signed)
You can go ahead and work him into the schedule somewhere given his multiple DKA events.

## 2023-01-24 ENCOUNTER — Other Ambulatory Visit (HOSPITAL_COMMUNITY): Payer: Self-pay

## 2023-02-01 ENCOUNTER — Ambulatory Visit: Payer: Self-pay | Admitting: Student

## 2023-03-02 NOTE — Patient Instructions (Signed)

## 2023-03-03 ENCOUNTER — Encounter: Payer: Self-pay | Admitting: Nurse Practitioner

## 2023-03-03 ENCOUNTER — Ambulatory Visit (INDEPENDENT_AMBULATORY_CARE_PROVIDER_SITE_OTHER): Payer: Medicaid Other | Admitting: Nurse Practitioner

## 2023-03-03 VITALS — BP 132/90 | HR 109 | Ht 71.0 in | Wt 200.6 lb

## 2023-03-03 DIAGNOSIS — E1065 Type 1 diabetes mellitus with hyperglycemia: Secondary | ICD-10-CM | POA: Diagnosis not present

## 2023-03-03 DIAGNOSIS — Z794 Long term (current) use of insulin: Secondary | ICD-10-CM | POA: Diagnosis not present

## 2023-03-03 MED ORDER — DEXCOM G7 SENSOR MISC
1.0000 | 3 refills | Status: DC
Start: 2023-03-03 — End: 2024-01-03

## 2023-03-03 MED ORDER — INSULIN LISPRO 100 UNIT/ML IJ SOLN: INTRAMUSCULAR | 3 refills | Status: DC

## 2023-03-03 MED ORDER — LISINOPRIL 20 MG PO TABS: 20.0000 mg | ORAL_TABLET | Freq: Every day | ORAL | 1 refills | Status: DC

## 2023-03-03 NOTE — Progress Notes (Signed)
Endocrinology Consult Note       03/03/2023, 8:56 AM   Subjective:    Patient ID: Barry Pittman, male    DOB: 05-Jul-1985.  Barry Pittman is being seen in consultation for management of currently uncontrolled symptomatic diabetes requested by  Patient, No Pcp Per.   Past Medical History:  Diagnosis Date   Diabetes mellitus without complication (HCC)    type 1   Hypertension     Past Surgical History:  Procedure Laterality Date   EYE SURGERY     For retinopathy     Social History   Socioeconomic History   Marital status: Single    Spouse name: Not on file   Number of children: Not on file   Years of education: Not on file   Highest education level: Not on file  Occupational History   Not on file  Tobacco Use   Smoking status: Never   Smokeless tobacco: Never  Vaping Use   Vaping status: Never Used  Substance and Sexual Activity   Alcohol use: Yes   Drug use: No   Sexual activity: Yes  Other Topics Concern   Not on file  Social History Narrative   Not on file   Social Determinants of Health   Financial Resource Strain: Not on file  Food Insecurity: No Food Insecurity (01/16/2023)   Hunger Vital Sign    Worried About Running Out of Food in the Last Year: Never true    Ran Out of Food in the Last Year: Never true  Transportation Needs: No Transportation Needs (01/16/2023)   PRAPARE - Administrator, Civil Service (Medical): No    Lack of Transportation (Non-Medical): No  Physical Activity: Not on file  Stress: Not on file  Social Connections: Moderately Isolated (12/26/2022)   Social Connection and Isolation Panel [NHANES]    Frequency of Communication with Friends and Family: More than three times a week    Frequency of Social Gatherings with Friends and Family: Three times a week    Attends Religious Services: Never    Active Member of Clubs or Organizations: No     Attends Banker Meetings: 1 to 4 times per year    Marital Status: Never married    Family History  Problem Relation Age of Onset   Diabetes Mother    Heart disease Mother    Diabetes Father    Heart disease Father     Outpatient Encounter Medications as of 03/03/2023  Medication Sig   blood glucose meter kit and supplies KIT Dispense based on patient and insurance preference. Use up to four times daily as directed. (FOR ICD-9 250.00, 250.01).   Blood Glucose Monitoring Suppl DEVI 1 each by Does not apply route 3 (three) times daily. May dispense any manufacturer covered by patient's insurance.   Blood Glucose Monitoring Suppl DEVI 1 each by Does not apply route 3 (three) times daily. May dispense any manufacturer covered by patient's insurance.   Continuous Glucose Sensor (DEXCOM G7 SENSOR) MISC Inject 1 Application into the skin as directed. Change sensor every 10 days as directed.   Glucagon (GVOKE HYPOPEN 1-PACK) 1 MG/0.2ML SOAJ Inject  1 mg as directed daily as needed for up to 1 dose (low blood sugar).   Glucose Blood (BLOOD GLUCOSE TEST STRIPS) STRP 1 each by Does not apply route 3 (three) times daily. Use as directed to check blood sugar. May dispense any manufacturer covered by patient's insurance and fits patient's device.   Glucose Blood (BLOOD GLUCOSE TEST STRIPS) STRP 1 each by Does not apply route 3 (three) times daily. Use as directed to check blood sugar. May dispense any manufacturer covered by patient's insurance and fits patient's device.   Insulin Human (INSULIN PUMP) SOLN Inject 1 each into the skin as directed.   insulin lispro (HUMALOG) 100 UNIT/ML injection Use with pump for TDD around 100 units daily   Insulin Pen Needle (PEN NEEDLES) 31G X 5 MM MISC 1 each by Does not apply route 3 (three) times daily. May dispense any manufacturer covered by patient's insurance.   Insulin Pen Needle (PEN NEEDLES) 31G X 5 MM MISC 1 each by Does not apply route 3  (three) times daily. May dispense any manufacturer covered by patient's insurance.   Insulin Pen Needle (PEN NEEDLES) 31G X 5 MM MISC Use as directed.   Insulin Pump Accessories MISC 1 each by Does not apply route daily. Patient requires Mini-Med Infusion set   Lancet Device MISC 1 each by Does not apply route 3 (three) times daily. May dispense any manufacturer covered by patient's insurance.   Lancet Device MISC 1 each by Does not apply route 3 (three) times daily. May dispense any manufacturer covered by patient's insurance.   Lancets (ACCU-CHEK SOFT TOUCH) lancets Use as directed.   Lancets MISC 1 each by Does not apply route 3 (three) times daily. Use as directed to check blood sugar. May dispense any manufacturer covered by patient's insurance and fits patient's device.   Lancets MISC 1 each by Does not apply route 3 (three) times daily. Use as directed to check blood sugar. May dispense any manufacturer covered by patient's insurance and fits patient's device.   nitroGLYCERIN (NITROSTAT) 0.4 MG SL tablet Place 0.4 mg under the tongue every 5 (five) minutes as needed for chest pain.   tadalafil (CIALIS) 20 MG tablet Take 1 tablet by mouth daily.   [DISCONTINUED] insulin lispro (HUMALOG) 100 UNIT/ML injection Inject 0.86 mLs (86 Units total) into the skin as directed. VIA PUMP   [DISCONTINUED] lisinopril (ZESTRIL) 20 MG tablet Take 1 tablet (20 mg total) by mouth daily.   lisinopril (ZESTRIL) 20 MG tablet Take 1 tablet (20 mg total) by mouth daily.   [DISCONTINUED] insulin aspart protamine - aspart (NOVOLOG 70/30 MIX) (70-30) 100 UNIT/ML FlexPen Inject 20 Units into the skin 2 (two) times daily with a meal. (Patient not taking: Reported on 03/03/2023)   [DISCONTINUED] insulin glargine (LANTUS) 100 UNIT/ML injection Inject 0.4 mLs (40 Units total) into the skin daily. Take this daily OR use the insulin pump. DO NOT USE BOTH SIMULTANEOUSLY. (Patient not taking: Reported on 03/03/2023)    [DISCONTINUED] insulin isophane & regular human KwikPen (HUMULIN 70/30 MIX) (70-30) 100 UNIT/ML KwikPen Inject 20 Units into the skin 2 (two) times daily. (Patient not taking: Reported on 03/03/2023)   [DISCONTINUED] insulin isophane & regular human KwikPen (NOVOLIN 70/30 KWIKPEN) (70-30) 100 UNIT/ML KwikPen Inject 20 Units into the skin 2 (two) times daily with a meal. (Patient not taking: Reported on 03/03/2023)   No facility-administered encounter medications on file as of 03/03/2023.    ALLERGIES: Allergies  Allergen Reactions  Insulin Aspart (Human Analog) Other (See Comments)    "goes into DKA" Can take Humalog but not Novolog    VACCINATION STATUS: Immunization History  Administered Date(s) Administered   Influenza,inj,Quad PF,6+ Mos 12/25/2017   Tdap 08/18/2014    Diabetes He presents for his initial diabetic visit. He has type 1 diabetes mellitus. Onset time: diagnosed at approx age of 64. His disease course has been fluctuating. Hypoglycemia symptoms include nervousness/anxiousness, sweats and tremors. Associated symptoms include blurred vision, fatigue, polydipsia and polyuria. Pertinent negatives for diabetes include no weight loss. There are no hypoglycemic complications. Symptoms are stable. (Multiple episodes of DKA) Risk factors for coronary artery disease include diabetes mellitus, family history, male sex and hypertension. Current diabetic treatment includes insulin pump (currently uses Medtronic but is not happy with it). He is compliant with treatment most of the time. His weight is increasing steadily. He is following a generally unhealthy diet. When asked about meal planning, he reported none. He has not had a previous visit with a dietitian. He participates in exercise intermittently. (He presents today for his consultation with no meter or logs to review.  He brought in his Medtronic sensor with him today.  His most recent A1c was 13.1% on 9/8.  He has been in/out of  the hospital for DKA recently.  He is wearing the Medtronic pump but is unhappy with it.  He has not been checking glucose recently.  He drinks diet coke and water and eats 3 meals, rarely snacking.  He does not engage in routine physical activity.  He is UTD on eye exam, has never seen podiatry in the past.) An ACE inhibitor/angiotensin II receptor blocker is being taken. He does not see a podiatrist.Eye exam is current.     Review of systems  Constitutional: + increasing body weight, current Body mass index is 27.98 kg/m., no fatigue, no subjective hyperthermia, no subjective hypothermia Eyes: no blurry vision, no xerophthalmia ENT: no sore throat, no nodules palpated in throat, no dysphagia/odynophagia, no hoarseness Cardiovascular: no chest pain, no shortness of breath, no palpitations, no leg swelling Respiratory: no cough, no shortness of breath Gastrointestinal: no nausea/vomiting/diarrhea Musculoskeletal: no muscle/joint aches Skin: no rashes, no hyperemia Neurological: no tremors, + numbness/ tingling/ burning pain to bilateral feet (worse at night), no dizziness Psychiatric: no depression, no anxiety  Objective:     BP (!) 132/90 (BP Location: Right Arm, Patient Position: Sitting, Cuff Size: Large)   Pulse (!) 109   Ht 5\' 11"  (1.803 m)   Wt 200 lb 9.6 oz (91 kg)   BMI 27.98 kg/m   Wt Readings from Last 3 Encounters:  03/03/23 200 lb 9.6 oz (91 kg)  01/16/23 195 lb 5.2 oz (88.6 kg)  12/25/22 190 lb (86.2 kg)     BP Readings from Last 3 Encounters:  03/03/23 (!) 132/90  01/18/23 (!) 140/76  12/27/22 (!) 147/85     Physical Exam- Limited  Constitutional:  Body mass index is 27.98 kg/m. , not in acute distress, anxious state of mind Eyes:  EOMI, no exophthalmos Neck: Supple Cardiovascular: tachycardic, no murmurs, rubs, or gallops, no edema Respiratory: Adequate breathing efforts, no crackles, rales, rhonchi, or wheezing Musculoskeletal: no gross deformities,  strength intact in all four extremities, no gross restriction of joint movements Skin:  no rashes, no hyperemia Neurological: no tremor with outstretched hands   Diabetic Foot Exam - Simple   No data filed      CMP ( most recent) CMP  Component Value Date/Time   NA 133 (L) 01/18/2023 0448   K 3.5 01/18/2023 0448   CL 96 (L) 01/18/2023 0448   CO2 30 01/18/2023 0448   GLUCOSE 242 (H) 01/18/2023 0448   BUN 16 01/18/2023 0448   CREATININE 0.79 01/18/2023 0448   CALCIUM 8.4 (L) 01/18/2023 0448   PROT 5.9 (L) 12/27/2022 0530   ALBUMIN 2.8 (L) 12/27/2022 0530   AST 13 (L) 12/27/2022 0530   ALT 8 12/27/2022 0530   ALKPHOS 57 12/27/2022 0530   BILITOT 0.9 12/27/2022 0530   GFRNONAA >60 01/18/2023 0448     Diabetic Labs (most recent): Lab Results  Component Value Date   HGBA1C 13.1 (H) 12/25/2022   HGBA1C 12.6 (H) 11/19/2017   HGBA1C 11.6 (H) 07/06/2017   MICROALBUR 0.7 12/25/2017   MICROALBUR 1.3 12/06/2016     Lipid Panel ( most recent) Lipid Panel     Component Value Date/Time   CHOL 144 12/25/2017 1406   TRIG 105.0 12/25/2017 1406   HDL 47.10 12/25/2017 1406   CHOLHDL 3 12/25/2017 1406   VLDL 21.0 12/25/2017 1406   LDLCALC 75 12/25/2017 1406      Lab Results  Component Value Date   TSH 0.97 12/25/2017   TSH 0.87 12/06/2016           Assessment & Plan:   1) Type 1 diabetes mellitus with hyperglycemia (HCC)  He presents today for his consultation with no meter or logs to review.  He brought in his Medtronic sensor with him today.  His most recent A1c was 13.1% on 9/8.  He has been in/out of the hospital for DKA recently.  He is wearing the Medtronic pump but is unhappy with it.  He has not been checking glucose recently.  He drinks diet coke and water and eats 3 meals, rarely snacking.  He does not engage in routine physical activity.  He is UTD on eye exam, has never seen podiatry in the past.  - Barry Pittman has currently uncontrolled  symptomatic type 1 DM since 37 years of age, with most recent A1c of 13.1 %.   -Recent labs reviewed.  - I had a long discussion with him about the progressive nature of diabetes and the pathology behind its complications. -his diabetes is complicated by multiple episodes of DKA, retinopathy, and he remains at a high risk for more acute and chronic complications which include CAD, CVA, CKD, retinopathy, and neuropathy. These are all discussed in detail with him.  The following Lifestyle Medicine recommendations according to American College of Lifestyle Medicine [facility]) were discussed and offered to patient and he agrees to start the journey:  A. Whole Foods, Plant-based plate comprising of fruits and vegetables, plant-based proteins, whole-grain carbohydrates was discussed in detail with the patient.   A list for source of those nutrients were also provided to the patient.  Patient will use only water or unsweetened tea for hydration. B.  The need to stay away from risky substances including alcohol, smoking; obtaining 7 to 9 hours of restorative sleep, at least 150 minutes of moderate intensity exercise weekly, the importance of healthy social connections,  and stress reduction techniques were discussed. C.  A full color page of  Calorie density of various food groups per pound showing examples of each food groups was provided to the patient.  - I have counseled him on diet and weight management by adopting a carbohydrate restricted/protein rich diet. Patient is encouraged to switch to unprocessed  or minimally processed complex starch and increased protein intake (animal or plant source), fruits, and vegetables. -  he is advised to stick to a routine mealtimes to eat 3 meals a day and avoid unnecessary snacks (to snack only to correct hypoglycemia).   - he acknowledges that there is a room for improvement in his food and drink choices. - Suggestion is made for him to avoid simple carbohydrates from  his diet including Cakes, Sweet Desserts, Ice Cream, Soda (diet and regular), Sweet Tea, Candies, Chips, Cookies, Store Bought Juices, Alcohol in Excess of 1-2 drinks a day, Artificial Sweeteners, Coffee Creamer, and "Sugar-free" Products. This will help patient to have more stable blood glucose profile and potentially avoid unintended weight gain.  - I have approached him with the following individualized plan to manage his diabetes and patient agrees:   -He is advised to continue his Medtronic pump with current settings for now.  I did give him sample Dexcom G7 to start using so he can monitor glucose properly.  Will see if he is a good candidate to switch to Omnipod 5 (sample in office- will set aside for him).  His current settings are as follows: basal rate 1.9 units /hr; 1:5-7 carb ratio; insulin sens. Factor 1:25; target glucose 100-120; active insulin time of 4 hrs  -he is encouraged to start monitoring glucose 4 times daily, before meals and before bed, to log their readings on the clinic sheets provided, and bring them to review at follow up appointment in 3 months.  - he is warned not to take insulin without proper monitoring per orders. - Adjustment parameters are given to him for hypo and hyperglycemia in writing. - he is encouraged to call clinic for blood glucose levels less than 70 or above 300 mg /dl.  -He is not a candidate for noninsulin therapies due to type 1 diagnosis.  Insulin is the exclusive choice to manage his diabetes.  He does note that he cannot take Novolog, notes that it made him go into DKA in the past.  - Specific targets for  A1c; LDL, HDL, and Triglycerides were discussed with the patient.  2) Blood Pressure /Hypertension:  his blood pressure is controlled to target.   he is advised to continue his current medications including Lisinopril 20 mg p.o. daily with breakfast.  3) Lipids/Hyperlipidemia:    There is no recent lipid panel available to review, nor  is he on any lipid lowering medications.  Will check lipid panel on subsequent visits.   4)  Weight/Diet:  his Body mass index is 27.98 kg/m.  -   he is NOT a candidate for weight loss.  Exercise, and detailed carbohydrates information provided  -  detailed on discharge instructions.  5) Chronic Care/Health Maintenance: -he is on ACEI/ARB and not on Statin medications and is encouraged to initiate and continue to follow up with Ophthalmology, Dentist, Podiatrist at least yearly or according to recommendations, and advised to stay away from smoking. I have recommended yearly flu vaccine and pneumonia vaccine at least every 5 years; moderate intensity exercise for up to 150 minutes weekly; and sleep for at least 7 hours a day.  - he is advised to maintain close follow up with Patient, No Pcp Per for primary care needs, as well as his other providers for optimal and coordinated care.   - Time spent in this patient care: 60 min, of which > 50% was spent in counseling him about his diabetes and the rest  reviewing his blood glucose logs, discussing his hypoglycemia and hyperglycemia episodes, reviewing his current and previous labs/studies (including abstraction from other facilities) and medications doses and developing a long term treatment plan based on the latest standards of care/guidelines; and documenting his care.    Please refer to Patient Instructions for Blood Glucose Monitoring and Insulin/Medications Dosing Guide" in media tab for additional information. Please also refer to "Patient Self Inventory" in the Media tab for reviewed elements of pertinent patient history.  Barry Pittman participated in the discussions, expressed understanding, and voiced agreement with the above plans.  All questions were answered to his satisfaction. he is encouraged to contact clinic should he have any questions or concerns prior to his return visit.     Follow up plan: - Return in about 3 months  (around 06/03/2023) for Diabetes F/U with A1c in office, No previsit labs, Bring meter and logs.    Ronny Bacon, [facility] [address] [place], [place] [address] Phone: [phone] Fax: [phone]  03/03/2023, 8:56 AM

## 2023-03-06 ENCOUNTER — Ambulatory Visit: Payer: Medicaid Other | Admitting: Nurse Practitioner

## 2023-03-09 ENCOUNTER — Telehealth: Payer: Self-pay | Admitting: Nurse Practitioner

## 2023-03-09 ENCOUNTER — Other Ambulatory Visit: Payer: Self-pay | Admitting: Nurse Practitioner

## 2023-03-09 MED ORDER — OMNIPOD 5 DEXG7G6 PODS GEN 5 MISC: 6 refills | Status: DC

## 2023-03-09 NOTE — Telephone Encounter (Signed)
Pt needs his Pump Insulin and Pods sent in. Please send to [facility] [address]

## 2023-03-09 NOTE — Telephone Encounter (Signed)
I had sent in his Humalog on 11/15 for him but I did just send in a script for the pods

## 2023-03-10 ENCOUNTER — Telehealth: Payer: Self-pay

## 2023-03-10 ENCOUNTER — Other Ambulatory Visit (HOSPITAL_COMMUNITY): Payer: Self-pay

## 2023-03-10 NOTE — Telephone Encounter (Signed)
Pharmacy Patient Advocate Encounter   Received notification from CoverMyMeds that prior authorization for Dexcom G7 sensor is required/requested.   Insurance verification completed.   The patient is insured through [facility] .   Per test claim: PA required; PA started via CoverMyMeds. KEY [id number] . Waiting for clinical questions to populate.

## 2023-03-10 NOTE — Telephone Encounter (Signed)
Clinical info including chart notes and labs have been submitted

## 2023-03-13 NOTE — Telephone Encounter (Signed)
Pharmacy Patient Advocate Encounter  Received notification from [facility] that Prior Authorization for Dexcom G7 sensor has been APPROVED from 03/10/23 to 09/07/23   PA #/Case ID/Reference #: [id number]

## 2023-03-13 NOTE — Telephone Encounter (Signed)
Patient was called and a message was left, letting him know that his Dexcom G7 had been approved.

## 2023-05-21 ENCOUNTER — Encounter: Payer: Self-pay | Admitting: Nurse Practitioner

## 2023-06-05 NOTE — Telephone Encounter (Signed)
[place], thank you for your help!

## 2023-06-05 NOTE — Telephone Encounter (Signed)
Patient is wanting to be seen for evaluation for ED, is this something you can help with or do you want me to refer him out to Urology?  He has not mentioned anything to me about this previously, but I have only seen him once thus far for type 1 diabetes (on pump).

## 2023-06-08 NOTE — Telephone Encounter (Signed)
See patient message about rescheduling

## 2023-06-13 ENCOUNTER — Encounter: Payer: Self-pay | Admitting: Nurse Practitioner

## 2023-06-13 ENCOUNTER — Ambulatory Visit (INDEPENDENT_AMBULATORY_CARE_PROVIDER_SITE_OTHER): Payer: Medicaid Other | Admitting: Nurse Practitioner

## 2023-06-13 VITALS — BP 140/80 | HR 100 | Ht 71.0 in | Wt 211.4 lb

## 2023-06-13 DIAGNOSIS — Z794 Long term (current) use of insulin: Secondary | ICD-10-CM

## 2023-06-13 DIAGNOSIS — E1065 Type 1 diabetes mellitus with hyperglycemia: Secondary | ICD-10-CM | POA: Diagnosis not present

## 2023-06-13 LAB — POCT GLYCOSYLATED HEMOGLOBIN (HGB A1C): Hemoglobin A1C: 9 % — AB (ref 4.0–5.6)

## 2023-06-13 MED ORDER — LISINOPRIL 20 MG PO TABS: 20.0000 mg | ORAL_TABLET | Freq: Every day | ORAL | 1 refills | Status: DC

## 2023-06-13 NOTE — Progress Notes (Signed)
 Endocrinology Follow Up Note       06/13/2023, 11:43 AM   Subjective:    Patient ID: Barry Pittman, male    DOB: 1985-07-22.  Barry Pittman is being seen in follow up after being seen in consultation for management of currently uncontrolled symptomatic diabetes requested by  Patient, No Pcp Per.   Past Medical History:  Diagnosis Date   Diabetes mellitus without complication (HCC)    type 1   Hypertension     Past Surgical History:  Procedure Laterality Date   EYE SURGERY     For retinopathy     Social History   Socioeconomic History   Marital status: Single    Spouse name: Not on file   Number of children: Not on file   Years of education: Not on file   Highest education level: Not on file  Occupational History   Not on file  Tobacco Use   Smoking status: Never   Smokeless tobacco: Never  Vaping Use   Vaping status: Never Used  Substance and Sexual Activity   Alcohol use: Yes   Drug use: No   Sexual activity: Yes  Other Topics Concern   Not on file  Social History Narrative   Not on file   Social Drivers of Health   Financial Resource Strain: Not on file  Food Insecurity: No Food Insecurity (01/16/2023)   Hunger Vital Sign    Worried About Running Out of Food in the Last Year: Never true    Ran Out of Food in the Last Year: Never true  Transportation Needs: No Transportation Needs (01/16/2023)   PRAPARE - Administrator, Civil Service (Medical): No    Lack of Transportation (Non-Medical): No  Physical Activity: Not on file  Stress: Not on file  Social Connections: Moderately Isolated (12/26/2022)   Social Connection and Isolation Panel [NHANES]    Frequency of Communication with Friends and Family: More than three times a week    Frequency of Social Gatherings with Friends and Family: Three times a week    Attends Religious Services: Never    Active Member of  Clubs or Organizations: No    Attends Banker Meetings: 1 to 4 times per year    Marital Status: Never married    Family History  Problem Relation Age of Onset   Diabetes Mother    Heart disease Mother    Diabetes Father    Heart disease Father     Outpatient Encounter Medications as of 06/13/2023  Medication Sig   Blood Glucose Monitoring Suppl DEVI 1 each by Does not apply route 3 (three) times daily. May dispense any manufacturer covered by patient's insurance.   Blood Glucose Monitoring Suppl DEVI 1 each by Does not apply route 3 (three) times daily. May dispense any manufacturer covered by patient's insurance.   Continuous Glucose Sensor (DEXCOM G7 SENSOR) MISC Inject 1 Application into the skin as directed. Change sensor every 10 days as directed.   Glucagon (GVOKE HYPOPEN 1-PACK) 1 MG/0.2ML SOAJ Inject 1 mg as directed daily as needed for up to 1 dose (low blood sugar).   Glucose Blood (BLOOD GLUCOSE  TEST STRIPS) STRP 1 each by Does not apply route 3 (three) times daily. Use as directed to check blood sugar. May dispense any manufacturer covered by patient's insurance and fits patient's device.   Glucose Blood (BLOOD GLUCOSE TEST STRIPS) STRP 1 each by Does not apply route 3 (three) times daily. Use as directed to check blood sugar. May dispense any manufacturer covered by patient's insurance and fits patient's device.   Insulin Disposable Pump (OMNIPOD 5 [id number] PODS GEN 5) MISC Change pod every 72 hours   Insulin Human (INSULIN PUMP) SOLN Inject 1 each into the skin as directed.   insulin lispro (HUMALOG) 100 UNIT/ML injection Use with pump for TDD around 100 units daily   Insulin Pen Needle (PEN NEEDLES) 31G X 5 MM MISC 1 each by Does not apply route 3 (three) times daily. May dispense any manufacturer covered by patient's insurance.   Insulin Pen Needle (PEN NEEDLES) 31G X 5 MM MISC 1 each by Does not apply route 3 (three) times daily. May dispense any manufacturer  covered by patient's insurance.   Insulin Pen Needle (PEN NEEDLES) 31G X 5 MM MISC Use as directed.   Insulin Pump Accessories MISC 1 each by Does not apply route daily. Patient requires Mini-Med Infusion set   Lancet Device MISC 1 each by Does not apply route 3 (three) times daily. May dispense any manufacturer covered by patient's insurance.   Lancet Device MISC 1 each by Does not apply route 3 (three) times daily. May dispense any manufacturer covered by patient's insurance.   Lancets (ACCU-CHEK SOFT TOUCH) lancets Use as directed.   Lancets MISC 1 each by Does not apply route 3 (three) times daily. Use as directed to check blood sugar. May dispense any manufacturer covered by patient's insurance and fits patient's device.   Lancets MISC 1 each by Does not apply route 3 (three) times daily. Use as directed to check blood sugar. May dispense any manufacturer covered by patient's insurance and fits patient's device.   nitroGLYCERIN (NITROSTAT) 0.4 MG SL tablet Place 0.4 mg under the tongue every 5 (five) minutes as needed for chest pain.   tadalafil (CIALIS) 20 MG tablet Take 1 tablet by mouth daily.   [DISCONTINUED] lisinopril (ZESTRIL) 20 MG tablet Take 1 tablet (20 mg total) by mouth daily.   blood glucose meter kit and supplies KIT Dispense based on patient and insurance preference. Use up to four times daily as directed. (FOR ICD-9 250.00, 250.01).   lisinopril (ZESTRIL) 20 MG tablet Take 1 tablet (20 mg total) by mouth daily.   No facility-administered encounter medications on file as of 06/13/2023.    ALLERGIES: Allergies  Allergen Reactions   Insulin Aspart (Human Analog) Other (See Comments)    "goes into DKA" Can take Humalog but not Novolog    VACCINATION STATUS: Immunization History  Administered Date(s) Administered   Influenza,inj,Quad PF,6+ Mos 12/25/2017   Tdap 08/18/2014    Diabetes He presents for his follow-up diabetic visit. He has type 1 diabetes mellitus. Onset  time: diagnosed at approx age of 38. His disease course has been improving. There are no hypoglycemic associated symptoms. Associated symptoms include blurred vision, fatigue, polydipsia and polyuria. Pertinent negatives for diabetes include no weight loss. There are no hypoglycemic complications. Symptoms are improving. (Multiple episodes of DKA) Risk factors for coronary artery disease include diabetes mellitus, family history, male sex and hypertension. Current diabetic treatment includes insulin pump (Omnipod 5). He is compliant with treatment most of the  time. His weight is increasing steadily. He is following a generally unhealthy diet. When asked about meal planning, he reported none. He has not had a previous visit with a dietitian. He participates in exercise intermittently. His home blood glucose trend is decreasing steadily. His overall blood glucose range is 140-180 mg/dl. (He presents today with his Omnipod 5 Dexcom combo showing improving glycemic profile overall.  His POCT A1c today is 9%, improving drastically from last visit of 13.1%.  Analysis of his CGM shows TIR 60%, TAR 40%, TBR 0%.  He did lose his PDM at one point and had to put in own settings once he got a new one, thus they are a bit off.) An ACE inhibitor/angiotensin II receptor blocker is being taken. He does not see a podiatrist.Eye exam is current.     Review of systems  Constitutional: + increasing body weight, current Body mass index is 29.48 kg/m., no fatigue, no subjective hyperthermia, no subjective hypothermia Eyes: no blurry vision, no xerophthalmia ENT: no sore throat, no nodules palpated in throat, no dysphagia/odynophagia, no hoarseness Cardiovascular: no chest pain, no shortness of breath, no palpitations, no leg swelling Respiratory: no cough, no shortness of breath Gastrointestinal: no nausea/vomiting/diarrhea Musculoskeletal: no muscle/joint aches Skin: no rashes, no hyperemia Neurological: no tremors, +  numbness/ tingling/ burning pain to bilateral feet (worse at night), no dizziness Psychiatric: no depression, no anxiety  Objective:     BP (!) 140/80 (BP Location: Right Arm, Patient Position: Sitting, Cuff Size: Large)   Pulse 100   Ht 5\' 11"  (1.803 m)   Wt 211 lb 6.4 oz (95.9 kg)   BMI 29.48 kg/m   Wt Readings from Last 3 Encounters:  06/13/23 211 lb 6.4 oz (95.9 kg)  03/03/23 200 lb 9.6 oz (91 kg)  01/16/23 195 lb 5.2 oz (88.6 kg)     BP Readings from Last 3 Encounters:  06/13/23 (!) 140/80  03/03/23 (!) 132/90  01/18/23 (!) 140/76      Physical Exam- Limited  Constitutional:  Body mass index is 29.48 kg/m. , not in acute distress, normal state of mind Eyes:  EOMI, no exophthalmos Musculoskeletal: no gross deformities, strength intact in all four extremities, no gross restriction of joint movements Skin:  no rashes, no hyperemia Neurological: no tremor with outstretched hands   Diabetic Foot Exam - Simple   No data filed      CMP ( most recent) CMP     Component Value Date/Time   NA 133 (L) 01/18/2023 0448   K 3.5 01/18/2023 0448   CL 96 (L) 01/18/2023 0448   CO2 30 01/18/2023 0448   GLUCOSE 242 (H) 01/18/2023 0448   BUN 16 01/18/2023 0448   CREATININE 0.79 01/18/2023 0448   CALCIUM 8.4 (L) 01/18/2023 0448   PROT 5.9 (L) 12/27/2022 0530   ALBUMIN 2.8 (L) 12/27/2022 0530   AST 13 (L) 12/27/2022 0530   ALT 8 12/27/2022 0530   ALKPHOS 57 12/27/2022 0530   BILITOT 0.9 12/27/2022 0530   GFRNONAA >60 01/18/2023 0448     Diabetic Labs (most recent): Lab Results  Component Value Date   HGBA1C 9.0 (A) 06/13/2023   HGBA1C 13.1 (H) 12/25/2022   HGBA1C 12.6 (H) 11/19/2017   MICROALBUR 0.7 12/25/2017   MICROALBUR 1.3 12/06/2016     Lipid Panel ( most recent) Lipid Panel     Component Value Date/Time   CHOL 144 12/25/2017 1406   TRIG 105.0 12/25/2017 1406   HDL 47.10 12/25/2017 1406  CHOLHDL 3 12/25/2017 1406   VLDL 21.0 12/25/2017 1406    LDLCALC 75 12/25/2017 1406      Lab Results  Component Value Date   TSH 0.97 12/25/2017   TSH 0.87 12/06/2016           Assessment & Plan:   1) Type 1 diabetes mellitus with hyperglycemia (HCC)  He presents today with his Omnipod 5 Dexcom combo showing improving glycemic profile overall.  His POCT A1c today is 9%, improving drastically from last visit of 13.1%.  Analysis of his CGM shows TIR 60%, TAR 40%, TBR 0%.  He did lose his PDM at one point and had to put in own settings once he got a new one, thus they are a bit off.  - Barry Pittman has currently uncontrolled symptomatic type 1 DM since 38 years of age.  -Recent labs reviewed.  - I had a long discussion with him about the progressive nature of diabetes and the pathology behind its complications. -his diabetes is complicated by multiple episodes of DKA, retinopathy, and he remains at a high risk for more acute and chronic complications which include CAD, CVA, CKD, retinopathy, and neuropathy. These are all discussed in detail with him.  The following Lifestyle Medicine recommendations according to American College of Lifestyle Medicine [facility]) were discussed and offered to patient and he agrees to start the journey:  A. Whole Foods, Plant-based plate comprising of fruits and vegetables, plant-based proteins, whole-grain carbohydrates was discussed in detail with the patient.   A list for source of those nutrients were also provided to the patient.  Patient will use only water or unsweetened tea for hydration. B.  The need to stay away from risky substances including alcohol, smoking; obtaining 7 to 9 hours of restorative sleep, at least 150 minutes of moderate intensity exercise weekly, the importance of healthy social connections,  and stress reduction techniques were discussed. C.  A full color page of  Calorie density of various food groups per pound showing examples of each food groups was provided to the patient.  -  Nutritional counseling repeated at each appointment due to patients tendency to fall back in to old habits.  - The patient admits there is a room for improvement in their diet and drink choices. -  Suggestion is made for the patient to avoid simple carbohydrates from their diet including Cakes, Sweet Desserts / Pastries, Ice Cream, Soda (diet and regular), Sweet Tea, Candies, Chips, Cookies, Sweet Pastries, Store Bought Juices, Alcohol in Excess of 1-2 drinks a day, Artificial Sweeteners, Coffee Creamer, and "Sugar-free" Products. This will help patient to have stable blood glucose profile and potentially avoid unintended weight gain.   - I encouraged the patient to switch to unprocessed or minimally processed complex starch and increased protein intake (animal or plant source), fruits, and vegetables.   - Patient is advised to stick to a routine mealtimes to eat 3 meals a day and avoid unnecessary snacks (to snack only to correct hypoglycemia).  - I have approached him with the following individualized plan to manage his diabetes and patient agrees:   -He was transitioned to Omnipod 5 pump between visits and so far he is loving the switch.  I did help him adjust his settings back to original settings.  I also encouraged him to ensure he boluses before each meal.  -he is encouraged to continue monitoring glucose 4 times daily (using his CGM), before meals and before bed, and to call the  clinic if he has readings less than 70 or above 300 for 3 tests in a row.  - he is warned not to take insulin without proper monitoring per orders. - Adjustment parameters are given to him for hypo and hyperglycemia in writing.  -He is not a candidate for noninsulin therapies due to type 1 diagnosis.  Insulin is the exclusive choice to manage his diabetes.  He does note that he cannot take Novolog, notes that it made him go into DKA in the past.  - Specific targets for  A1c; LDL, HDL, and Triglycerides were  discussed with the patient.  2) Blood Pressure /Hypertension:  his blood pressure is controlled to target.   he is advised to continue his current medications including Lisinopril 20 mg p.o. daily with breakfast.  3) Lipids/Hyperlipidemia:    There is no recent lipid panel available to review, nor is he on any lipid lowering medications.  Will check lipid panel on subsequent visits.   4)  Weight/Diet:  his Body mass index is 29.48 kg/m.  -   he is NOT a candidate for weight loss.  Exercise, and detailed carbohydrates information provided  -  detailed on discharge instructions.  5) Chronic Care/Health Maintenance: -he is on ACEI/ARB and not on Statin medications and is encouraged to initiate and continue to follow up with Ophthalmology, Dentist, Podiatrist at least yearly or according to recommendations, and advised to stay away from smoking. I have recommended yearly flu vaccine and pneumonia vaccine at least every 5 years; moderate intensity exercise for up to 150 minutes weekly; and sleep for at least 7 hours a day.  - he is advised to maintain close follow up with Patient, No Pcp Per for primary care needs, as well as his other providers for optimal and coordinated care.     I spent  33  minutes in the care of the patient today including review of labs from CMP, Lipids, Thyroid Function, Hematology (current and previous including abstractions from other facilities); face-to-face time discussing  his blood glucose readings/logs, discussing hypoglycemia and hyperglycemia episodes and symptoms, medications doses, his options of short and long term treatment based on the latest standards of care / guidelines;  discussion about incorporating lifestyle medicine;  and documenting the encounter. Risk reduction counseling performed per USPSTF guidelines to reduce obesity and cardiovascular risk factors.     Please refer to Patient Instructions for Blood Glucose Monitoring and Insulin/Medications  Dosing Guide"  in media tab for additional information. Please  also refer to " Patient Self Inventory" in the Media  tab for reviewed elements of pertinent patient history.  Barry Pittman participated in the discussions, expressed understanding, and voiced agreement with the above plans.  All questions were answered to his satisfaction. he is encouraged to contact clinic should he have any questions or concerns prior to his return visit.     Follow up plan: - Return in about 3 months (around 09/10/2023) for Diabetes F/U with A1c in office, Previsit labs, Bring meter and logs.   Ronny Bacon, [facility] [address] [place], [place] [address] Phone: [phone] Fax: [phone]  06/13/2023, 11:43 AM

## 2023-06-28 ENCOUNTER — Ambulatory Visit: Payer: Medicaid Other | Admitting: Urology

## 2023-07-24 ENCOUNTER — Ambulatory Visit (INDEPENDENT_AMBULATORY_CARE_PROVIDER_SITE_OTHER): Admitting: Urology

## 2023-07-24 ENCOUNTER — Encounter: Payer: Self-pay | Admitting: Urology

## 2023-07-24 VITALS — BP 182/116 | HR 93

## 2023-07-24 DIAGNOSIS — N319 Neuromuscular dysfunction of bladder, unspecified: Secondary | ICD-10-CM | POA: Diagnosis not present

## 2023-07-24 DIAGNOSIS — R339 Retention of urine, unspecified: Secondary | ICD-10-CM

## 2023-07-24 DIAGNOSIS — E1065 Type 1 diabetes mellitus with hyperglycemia: Secondary | ICD-10-CM | POA: Diagnosis not present

## 2023-07-24 DIAGNOSIS — N529 Male erectile dysfunction, unspecified: Secondary | ICD-10-CM

## 2023-07-24 DIAGNOSIS — Z8639 Personal history of other endocrine, nutritional and metabolic disease: Secondary | ICD-10-CM | POA: Insufficient documentation

## 2023-07-24 DIAGNOSIS — G4700 Insomnia, unspecified: Secondary | ICD-10-CM | POA: Insufficient documentation

## 2023-07-24 LAB — MICROSCOPIC EXAMINATION
Bacteria, UA: NONE SEEN
Epithelial Cells (non renal): NONE SEEN /HPF (ref 0–10)
WBC, UA: NONE SEEN /HPF (ref 0–5)

## 2023-07-24 LAB — URINALYSIS, ROUTINE W REFLEX MICROSCOPIC
Bilirubin, UA: NEGATIVE
Ketones, UA: NEGATIVE
Leukocytes,UA: NEGATIVE
Nitrite, UA: NEGATIVE
Specific Gravity, UA: 1.015 (ref 1.005–1.030)
Urobilinogen, Ur: 0.2 mg/dL (ref 0.2–1.0)
pH, UA: 6.5 (ref 5.0–7.5)

## 2023-07-24 LAB — BLADDER SCAN AMB NON-IMAGING: Scan Result: 486

## 2023-07-24 MED ORDER — SILDENAFIL CITRATE 100 MG PO TABS
100.0000 mg | ORAL_TABLET | Freq: Every day | ORAL | 0 refills | Status: AC | PRN
Start: 2023-07-24 — End: ?

## 2023-07-24 NOTE — Progress Notes (Signed)
 Name: Barry Pittman DOB: [birth year] MRN: [id number]  History of Present Illness: Barry Pittman is a 38 y.o. male who presents today as a new patient at [facility] [place]. All available relevant medical records have been reviewed.  GU History includes: 1. Polyuria secondary to uncontrolled T1DM.  - 06/13/2023: Hemoglobin A1c = 9.0. Improved compared to prior. 2. Incomplete bladder emptying with episodes of acute urinary retention.  - Likely related to neurogenic bladder secondary to his T1DM. - 12/12/2022: CT abdomen/pelvis w/ contrast showed no GU stones, masses, or hydronephrosis; bladder and prostate unremarkable. - 01/18/2023: Normal renal function (creatinine 0.79, GFR >60).   Today: He denies urinary urgency, frequency, nocturia, dysuria, gross hematuria, weak urinary stream, hesitancy, straining to void, or sensations of incomplete emptying. He denies flank pain or abdominal pain. He denies history of recent or recurrent UTI.  He reports bother related to erectile dysfunction for the past 4 years approximately. He states he is sometimes able to get an erection; sometimes is / sometimes is not strong enough for penetrative intercourse. He states he does lose his erection during intercourse. He states he does sometimes get morning erections.  He states his ejaculations are normal, non-painful, and without discoloration.  He denies  history of hypogonadism / low testosterone.  He reports fatigue and decreased sexual desire/ libido.  Denies concerns about infertility, decreased body hair, decreased muscle mass, weakness, hot flashes. He denies  history of GU surgery. He reports  prior use of PDE-5 inhibitors (Cialis) with minimal effect. He has a prior prescription for nitroglycerin which he denies ever taking. He denies  history of Ml, stroke, or life-threatening arrhythmia in past 6 months.  He reports that he is working diligently on improving his blood sugar  management now that he is established with an endocrinologist. Reports that life stressors (including a divorce last year) are also starting to settle down some.   Medications: Current Outpatient Medications  Medication Sig Dispense Refill   blood glucose meter kit and supplies KIT Dispense based on patient and insurance preference. Use up to four times daily as directed. (FOR ICD-9 250.00, 250.01). 1 each 0   Blood Glucose Monitoring Suppl DEVI 1 each by Does not apply route 3 (three) times daily. May dispense any manufacturer covered by patient's insurance. 1 each 0   Blood Glucose Monitoring Suppl DEVI 1 each by Does not apply route 3 (three) times daily. May dispense any manufacturer covered by patient's insurance. 1 each 0   Continuous Glucose Sensor (DEXCOM G7 SENSOR) MISC Inject 1 Application into the skin as directed. Change sensor every 10 days as directed. 9 each 3   Glucagon (GVOKE HYPOPEN 1-PACK) 1 MG/0.2ML SOAJ Inject 1 mg as directed daily as needed for up to 1 dose (low blood sugar). 0.2 mL 4   Glucose Blood (BLOOD GLUCOSE TEST STRIPS) STRP 1 each by Does not apply route 3 (three) times daily. Use as directed to check blood sugar. May dispense any manufacturer covered by patient's insurance and fits patient's device. 100 strip 0   Glucose Blood (BLOOD GLUCOSE TEST STRIPS) STRP 1 each by Does not apply route 3 (three) times daily. Use as directed to check blood sugar. May dispense any manufacturer covered by patient's insurance and fits patient's device. 100 strip 0   Insulin Disposable Pump (OMNIPOD 5 DEXG7G6 PODS GEN 5) MISC Change pod every 72 hours 10 each 6   Insulin Human (INSULIN PUMP) SOLN Inject 1 each into the skin  as directed. 1 each 0   insulin lispro (HUMALOG) 100 UNIT/ML injection Use with pump for TDD around 100 units daily 60 mL 3   Insulin Pen Needle (PEN NEEDLES) 31G X 5 MM MISC 1 each by Does not apply route 3 (three) times daily. May dispense any manufacturer covered  by patient's insurance. 100 each 0   Insulin Pen Needle (PEN NEEDLES) 31G X 5 MM MISC 1 each by Does not apply route 3 (three) times daily. May dispense any manufacturer covered by patient's insurance. 100 each 0   Insulin Pen Needle (PEN NEEDLES) 31G X 5 MM MISC Use as directed. 100 each 1   Insulin Pump Accessories MISC 1 each by Does not apply route daily. Patient requires Mini-Med Infusion set 1 each 0   Lancet Device MISC 1 each by Does not apply route 3 (three) times daily. May dispense any manufacturer covered by patient's insurance. 1 each 0   Lancet Device MISC 1 each by Does not apply route 3 (three) times daily. May dispense any manufacturer covered by patient's insurance. 1 each 0   Lancets (ACCU-CHEK SOFT TOUCH) lancets Use as directed. 100 each 0   Lancets MISC 1 each by Does not apply route 3 (three) times daily. Use as directed to check blood sugar. May dispense any manufacturer covered by patient's insurance and fits patient's device. 100 each 0   Lancets MISC 1 each by Does not apply route 3 (three) times daily. Use as directed to check blood sugar. May dispense any manufacturer covered by patient's insurance and fits patient's device. 100 each 0   lisinopril (ZESTRIL) 20 MG tablet Take 1 tablet (20 mg total) by mouth daily. 90 tablet 1   sildenafil (VIAGRA) 100 MG tablet Take 1 tablet (100 mg total) by mouth daily as needed for erectile dysfunction. 20 tablet 0   nitroGLYCERIN (NITROSTAT) 0.4 MG SL tablet Place 0.4 mg under the tongue every 5 (five) minutes as needed for chest pain. (Patient not taking: Reported on 07/24/2023)     No current facility-administered medications for this visit.    Allergies: Allergies  Allergen Reactions   Insulin Aspart (Human Analog) Other (See Comments)    "goes into DKA" Can take Humalog but not Novolog    Past Medical History:  Diagnosis Date   Diabetes mellitus without complication (HCC)    type 1   Hypertension    Past Surgical  History:  Procedure Laterality Date   EYE SURGERY     For retinopathy    Family History  Problem Relation Age of Onset   Diabetes Mother    Heart disease Mother    Diabetes Father    Heart disease Father    Social History   Socioeconomic History   Marital status: Single    Spouse name: Not on file   Number of children: Not on file   Years of education: Not on file   Highest education level: Not on file  Occupational History   Not on file  Tobacco Use   Smoking status: Never   Smokeless tobacco: Never  Vaping Use   Vaping status: Never Used  Substance and Sexual Activity   Alcohol use: Yes   Drug use: No   Sexual activity: Yes  Other Topics Concern   Not on file  Social History Narrative   Not on file   Social Drivers of Health   Financial Resource Strain: Not on file  Food Insecurity: No Food Insecurity (01/16/2023)  Hunger Vital Sign    Worried About Running Out of Food in the Last Year: Never true    Ran Out of Food in the Last Year: Never true  Transportation Needs: No Transportation Needs (01/16/2023)   PRAPARE - Administrator, Civil Service (Medical): No    Lack of Transportation (Non-Medical): No  Physical Activity: Not on file  Stress: Not on file  Social Connections: Moderately Isolated (12/26/2022)   Social Connection and Isolation Panel [NHANES]    Frequency of Communication with Friends and Family: More than three times a week    Frequency of Social Gatherings with Friends and Family: Three times a week    Attends Religious Services: Never    Active Member of Clubs or Organizations: No    Attends Banker Meetings: 1 to 4 times per year    Marital Status: Never married  Intimate Partner Violence: Not At Risk (01/16/2023)   Humiliation, Afraid, Rape, and Kick questionnaire    Fear of Current or Ex-Partner: No    Emotionally Abused: No    Physically Abused: No    Sexually Abused: No    SUBJECTIVE  Review of  Systems Constitutional: Patient denies any unintentional weight loss or change in strength lntegumentary: Patient denies any rashes or pruritus Cardiovascular: Patient denies chest pain or syncope Respiratory: Patient denies shortness of breath Gastrointestinal: Patient denies constipation Musculoskeletal: Patient denies muscle cramps or weakness Neurologic: Patient denies convulsions or seizures Allergic/Immunologic: Patient denies recent allergic reaction(s) Hematologic/Lymphatic: Patient denies bleeding tendencies Endocrine: Patient denies heat/cold intolerance  GU: As per HPI.  OBJECTIVE Vitals:   07/24/23 0837  BP: (!) 182/116  Pulse: 93   There is no height or weight on file to calculate BMI.  Physical Examination Constitutional: No obvious distress; patient is non-toxic appearing  Cardiovascular: No visible lower extremity edema.  Respiratory: The patient does not have audible wheezing/stridor; respirations do not appear labored  Gastrointestinal: Abdomen non-distended Musculoskeletal: Normal ROM of UEs  Skin: No obvious rashes/open sores  Neurologic: CN 2-12 grossly intact Psychiatric: Answered questions appropriately with normal affect  Hematologic/Lymphatic/Immunologic: No obvious bruises or sites of spontaneous bleeding  UA: glucosuria and proteinuria; no evidence of UTI or microscopic hematuria PVR: 486 ml  ASSESSMENT Incomplete bladder emptying - Plan: BLADDER SCAN AMB NON-IMAGING, Urinalysis, Routine w reflex microscopic, US RENAL, Basic metabolic panel with GFR  Neurogenic bladder - Plan: BLADDER SCAN AMB NON-IMAGING, Urinalysis, Routine w reflex microscopic, US RENAL, Basic metabolic panel with GFR  Uncontrolled type 1 diabetes mellitus with hyperglycemia (HCC) - Plan: BLADDER SCAN AMB NON-IMAGING, Urinalysis, Routine w reflex microscopic, US RENAL, Basic metabolic panel with GFR  Erectile dysfunction, unspecified erectile dysfunction type - Plan:  Testosterone, sildenafil (VIAGRA) 100 MG tablet  We discussed finding of acute urinary retention. Possible etiologies may include neurogenic bladder, BPH/BOO, constipation, anticholinergic medication use, high-tone pelvic floor dysfunction, voiding dyssynergia. In his case most likely neurogenic bladder secondary to his T1DM.   We discussed risks related to urinary retention / incomplete bladder emptying including but not limited to: bladder discomfort I pain, overflow urinary incontinence (which can result in skin breakdown / wounds), UTls, pyelonephritis, urosepsis, kidney damage/ failure, decreased kidney function requiring dialysis.  He strongly prefers to avoid catheter use if at all possible. We agreed to proceed cautiously without catheter use at this time based on the following: his kidney function appears to be remaining stable, he has had no problems with recent / recurrent UTIs, and had  no evidence of hydronephrosis on CT in August 2024. We agreed to recheck his renal function today and to obtain renal US to rescreen for hydronephrosis. We reviewed signs and symptoms of UTI and acute urinary retention to watch for and he was instructed to contact Urology promptly if symptoms occur.   Erectile dysfunction. Thorough discussion was had with patient regarding possible etiologies of erectile dysfunction including psychological, vasculogenic, neurologic, and testosterone deficiency. We agreed to check testosterone levels today. Patient elected to proceed with trial of Viagra (Sildenafil) 100 mg PRN. We discussed potential side effects and he was advised to seek urgent medical attention if he develops an erection lasting more than 2-4 hours due to the risk for irreversible permanent tissue damage. He was educated not to take nitroglycerin anywhere near the same time as Viagra due to risk for dangerous hypotension.   We agreed to plan for follow up in 4-6 weeks or sooner if needed. Patient verbalized  understanding of and agreement with current plan. All questions were answered.  PLAN Advised the following: Labs today (testosterone and BMP). Renal US. Viagra (Sildenafil) 100 mg PRN. Continue working on blood sugar control.  Return in about 4 weeks (around 08/21/2023) for UA, PVR, & f/u with Evette Georges NP.  Orders Placed This Encounter  Procedures   Microscopic Examination   US RENAL    Standing Status:   Future    Expected Date:   07/24/2023    Expiration Date:   07/23/2024    Reason for Exam (SYMPTOM  OR DIAGNOSIS REQUIRED):   kidney stone known or suspected    Preferred imaging location?:   [facility]   Urinalysis, Routine w reflex microscopic   Testosterone   Basic metabolic panel with GFR   BLADDER SCAN AMB NON-IMAGING    It has been explained that the patient is to follow regularly with their PCP in addition to all other providers involved in their care and to follow instructions provided by these respective offices. Patient advised to contact urology clinic if any urologic-pertaining questions, concerns, new symptoms or problems arise in the interim period.  There are no Patient Instructions on file for this visit.  Electronically signed by:  Donnita Falls, MSN, FNP-C, CUNP 07/24/2023 11:15 AM

## 2023-07-25 ENCOUNTER — Telehealth: Payer: Self-pay

## 2023-07-25 LAB — BASIC METABOLIC PANEL WITH GFR
BUN/Creatinine Ratio: 14 (ref 9–20)
BUN: 14 mg/dL (ref 6–20)
CO2: 22 mmol/L (ref 20–29)
Calcium: 10.1 mg/dL (ref 8.7–10.2)
Chloride: 98 mmol/L (ref 96–106)
Creatinine, Ser: 1.02 mg/dL (ref 0.76–1.27)
Glucose: 242 mg/dL — ABNORMAL HIGH (ref 70–99)
Potassium: 5.5 mmol/L — ABNORMAL HIGH (ref 3.5–5.2)
Sodium: 136 mmol/L (ref 134–144)
eGFR: 96 mL/min/{1.73_m2} (ref >59)

## 2023-07-25 LAB — TESTOSTERONE: Testosterone: 773 ng/dL (ref 264–916)

## 2023-07-25 NOTE — Telephone Encounter (Signed)
 Tried calling patient with no answer and unable to leave vm

## 2023-07-25 NOTE — Progress Notes (Signed)
 Unable to reach patient by phone letter mailed out

## 2023-07-31 ENCOUNTER — Ambulatory Visit (HOSPITAL_COMMUNITY): Admission: RE | Admit: 2023-07-31 | Source: Ambulatory Visit

## 2023-08-04 ENCOUNTER — Ambulatory Visit

## 2023-08-07 ENCOUNTER — Ambulatory Visit

## 2023-08-07 ENCOUNTER — Ambulatory Visit
Admission: RE | Admit: 2023-08-07 | Discharge: 2023-08-07 | Disposition: A | Discharge status: Home / Self Care | Source: Ambulatory Visit | Attending: Nurse Practitioner | Admitting: Nurse Practitioner

## 2023-08-07 VITALS — BP 171/100 | HR 98 | Temp 98.8°F | Resp 16

## 2023-08-07 DIAGNOSIS — Z113 Encounter for screening for infections with a predominantly sexual mode of transmission: Secondary | ICD-10-CM | POA: Diagnosis present

## 2023-08-07 DIAGNOSIS — R030 Elevated blood-pressure reading, without diagnosis of hypertension: Secondary | ICD-10-CM | POA: Insufficient documentation

## 2023-08-07 NOTE — ED Provider Notes (Signed)
 [facility]    CSN: [id number] Arrival date & time: 08/07/23  1519      History   Chief Complaint Chief Complaint  Patient presents with   Exposure to STD    STD check - Entered by patient    HPI Barry Pittman is a 38 y.o. male.   The history is provided by the patient.   Patient presents for STI testing.  Patient denies symptoms to include penile discharge, dysuria, pelvic pain, abdominal pain, scrotal/testicular pain, swelling, or urinary symptoms.  Patient reports 1 male partner in the past 90 days.  Denies prior history of STI/STD.  Patient is also requesting lab work for HIV and syphilis testing.  Patient's blood pressure noted to be elevated during triage.  Patient denies chest pain, shortness of breath, difficulty breathing, headache, or lower extremity edema.  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 Authorizing Provider  insulin  lispro (HUMALOG ) 100 UNIT/ML injection Use with pump for TDD around 100 units daily 03/03/23  Yes Reardon, Laurina Popper J, NP  lisinopril  (ZESTRIL ) 20 MG tablet Take 1 tablet (20 mg total) by mouth daily. 06/13/23  Yes Wendel Hals,  NP  blood glucose meter kit and supplies KIT Dispense based on patient and insurance preference. Use up to four times daily as directed. (FOR ICD-9 250.00, 250.01). 12/27/22   Haydee Lipa, MD  Blood Glucose Monitoring Suppl DEVI 1 each by Does not apply route 3 (three) times daily. May dispense any manufacturer covered by patient's insurance. 01/18/23   Mason Sole, Pratik D, DO  Blood Glucose Monitoring Suppl DEVI 1 each by Does not apply route 3 (three) times daily. May dispense any manufacturer covered by patient's insurance. 01/18/23   Mason Sole, Pratik D, DO  Continuous Glucose Sensor (DEXCOM G7 SENSOR) MISC Inject 1 Application into the skin as directed. Change sensor every 10 days as directed. 03/03/23   Wendel Hals, NP  Glucagon  (GVOKE HYPOPEN  1-PACK) 1 MG/0.2ML SOAJ Inject 1 mg as directed daily as needed for up to 1 dose (low blood sugar). 12/27/22   Haydee Lipa, MD  Glucose Blood (BLOOD GLUCOSE TEST STRIPS) STRP 1 each by Does not apply route 3 (three) times daily. Use as directed to check blood sugar. May dispense any manufacturer covered by patient's insurance and fits patient's device. 01/18/23   Mason Sole, Pratik D, DO  Glucose Blood (BLOOD GLUCOSE TEST STRIPS) STRP 1 each by Does not apply route 3 (three) times daily. Use as directed to check blood sugar. May dispense any manufacturer covered by patient's insurance and fits patient's device. 01/18/23   Mason Sole, Pratik D, DO  Insulin  Disposable Pump (  OMNIPOD 5 DEXG7G6 PODS GEN 5) MISC Change pod every 72 hours 03/09/23   Wendel Hals, NP  Insulin  Human (INSULIN  PUMP) SOLN Inject 1 each into the skin as directed. 12/25/22   Haydee Lipa, MD  Insulin  Pen Needle (PEN NEEDLES) 31G X 5 MM MISC 1 each by Does not apply route 3 (three) times daily. May dispense any manufacturer covered by patient's insurance. 01/18/23   Mason Sole, Pratik D, DO  Insulin  Pen Needle (PEN NEEDLES) 31G X 5 MM MISC 1 each by Does not apply route 3 (three) times  daily. May dispense any manufacturer covered by patient's insurance. 01/18/23   Mason Sole, Pratik D, DO  Insulin  Pen Needle (PEN NEEDLES) 31G X 5 MM MISC Use as directed. 01/18/23   Mason Sole, Pratik D, DO  Insulin  Pump Accessories MISC 1 each by Does not apply route daily. Patient requires Mini-Med Infusion set 12/27/22   Haydee Lipa, MD  Lancet Device MISC 1 each by Does not apply route 3 (three) times daily. May dispense any manufacturer covered by patient's insurance. 01/18/23   Doreene Gammon D, DO  Lancet Device MISC 1 each by Does not apply route 3 (three) times daily. May dispense any manufacturer covered by patient's insurance. 01/18/23   Mason Sole, Pratik D, DO  Lancets (ACCU-CHEK SOFT TOUCH) lancets Use as directed. 12/27/22   Haydee Lipa, MD  Lancets MISC 1 each by Does not apply route 3 (three) times daily. Use as directed to check blood sugar. May dispense any manufacturer covered by patient's insurance and fits patient's device. 01/18/23   Doreene Gammon D, DO  Lancets MISC 1 each by Does not apply route 3 (three) times daily. Use as directed to check blood sugar. May dispense any manufacturer covered by patient's insurance and fits patient's device. 01/18/23   Mason Sole, Pratik D, DO  nitroGLYCERIN (NITROSTAT) 0.4 MG SL tablet Place 0.4 mg under the tongue every 5 (five) minutes as needed for chest pain. Patient not taking: Reported on 07/24/2023 07/13/20   [provider]  sildenafil  (VIAGRA ) 100 MG tablet Take 1 tablet (100 mg total) by mouth daily as needed for erectile dysfunction. 07/24/23   Lauretta Ponto, FNP    Family History Family History  Problem Relation Age of Onset   Diabetes Mother    Heart disease Mother    Diabetes Father    Heart disease Father     Social History Social History   Tobacco Use   Smoking status: Never   Smokeless tobacco: Current    Types: Chew  Vaping Use   Vaping status: Never Used  Substance Use Topics   Alcohol use: Yes   Drug use: No      Allergies   Insulin  aspart (human analog)   Review of Systems Review of Systems Per HPI  Physical Exam Triage Vital Signs ED Triage Vitals  Encounter Vitals Group     BP 08/07/23 1533 (!) 200/102     Systolic BP Percentile --      Diastolic BP Percentile --      Pulse Rate 08/07/23 1533 98     Resp 08/07/23 1533 16     Temp 08/07/23 1533 98.8 F (37.1 C)     Temp Source 08/07/23 1533 Oral     SpO2 08/07/23 1533 95 %     Weight --      Height --      Head Circumference --      Peak Flow --  Pain Score 08/07/23 1534 0     Pain Loc --      Pain Education --      Exclude from Growth Chart --    No data found.  Updated Vital Signs BP (!) 171/100 (BP Location: Right Arm)   Pulse 98   Temp 98.8 F (37.1 C) (Oral)   Resp 16   SpO2 95%   Visual Acuity Right Eye Distance:   Left Eye Distance:   Bilateral Distance:    Right Eye Near:   Left Eye Near:    Bilateral Near:     Physical Exam Vitals and nursing note reviewed.  Constitutional:      General: He is not in acute distress.    Appearance: Normal appearance.  HENT:     Head: Normocephalic.  Eyes:     Extraocular Movements: Extraocular movements intact.     Pupils: Pupils are equal, round, and reactive to light.  Cardiovascular:     Rate and Rhythm: Normal rate and regular rhythm.     Pulses: Normal pulses.     Heart sounds: Normal heart sounds.  Pulmonary:     Effort: Pulmonary effort is normal. No respiratory distress.     Breath sounds: Normal breath sounds. No stridor. No wheezing, rhonchi or rales.  Abdominal:     General: Bowel sounds are normal.     Palpations: Abdomen is soft.     Tenderness: There is no abdominal tenderness.  Genitourinary:    Comments: GU exam deferred, self swab performed  Musculoskeletal:     Cervical back: Normal range of motion.  Skin:    General: Skin is warm and dry.  Neurological:     General: No focal deficit present.     Mental Status: He is alert  and oriented to person, place, and time.  Psychiatric:        Mood and Affect: Mood normal.        Behavior: Behavior normal.      UC Treatments / Results  Labs (all labs ordered are listed, but only abnormal results are displayed) Labs Reviewed  HIV ANTIBODY (ROUTINE TESTING W REFLEX)  RPR  CYTOLOGY, (ORAL, ANAL, URETHRAL) ANCILLARY ONLY    EKG   Radiology No results found.  Procedures Procedures (including critical care time)  Medications Ordered in UC Medications - No data to display  Initial Impression / Assessment and Plan / UC Course  I have reviewed the triage vital signs and the nursing notes.  Pertinent labs & imaging results that were available during my care of the patient were reviewed by me and considered in my medical decision making (see chart for details).  Cytology swab and HIV/RPR test are pending.  Patient advised to refrain from sexual intercourse until test results are received.  Supportive care recommendations were provided and discussed with the patient to include notifying all partners with his test results are positive, I am refraining from sexual intercourse for additional 7 days if treatment is required.  With regard to his elevated blood pressure, repeat BP was 171/100.  Patient is currently taking Zestril  20 mg daily.  Patient advised to follow-up with his PCP for further evaluation and maintenance of his blood pressure medication.  Patient was given strict ER follow-up precautions.  Patient was in agreement with this plan of care and verbalizes understanding.  All questions were answered.  Patient stable for discharge.  Final Clinical Impressions(s) / UC Diagnoses   Final diagnoses:  Screening examination for  sexually transmitted disease  Elevated blood pressure reading     Discharge Instructions      Your results should be available within the next 48 to 72 hours.  If you have access to MyChart, you will be able to see the results there.   If your results are positive, you will be contacted to discuss treatment. If your test results are positive, you will need to notify all partners. If you require treatment, you will need to refrain from sexual intercourse for an additional 7 days after completing treatment. Increase condom use.  Your blood pressure was elevated at the appointment today.  Please follow-up with your primary care physician to discuss further. Go to the emergency department immediately if you experience chest pain, shortness of breath, difficulty breathing, or other concerns.  Follow-up as needed.      ED Prescriptions   None    PDMP not reviewed this encounter.   Hardy Lia, NP 08/07/23 1626

## 2023-08-07 NOTE — Discharge Instructions (Addendum)
 Your results should be available within the next 48 to 72 hours.  If you have access to MyChart, you will be able to see the results there.  If your results are positive, you will be contacted to discuss treatment. If your test results are positive, you will need to notify all partners. If you require treatment, you will need to refrain from sexual intercourse for an additional 7 days after completing treatment. Increase condom use.  Your blood pressure was elevated at the appointment today.  Please follow-up with your primary care physician to discuss further. Go to the emergency department immediately if you experience chest pain, shortness of breath, difficulty breathing, or other concerns.  Follow-up as needed.

## 2023-08-07 NOTE — ED Triage Notes (Signed)
 Pt is wanting to have std testing done, currently no symptoms or exposure to stds that he knows of.   Pt states he would like the blood work too.

## 2023-08-09 LAB — CYTOLOGY, (ORAL, ANAL, URETHRAL) ANCILLARY ONLY
Chlamydia: NEGATIVE
Comment: NEGATIVE
Comment: NEGATIVE
Comment: NORMAL
Neisseria Gonorrhea: NEGATIVE
Trichomonas: NEGATIVE

## 2023-08-09 LAB — HIV ANTIBODY (ROUTINE TESTING W REFLEX): HIV Screen 4th Generation wRfx: NONREACTIVE

## 2023-08-09 LAB — RPR: RPR Ser Ql: NONREACTIVE

## 2023-08-11 ENCOUNTER — Ambulatory Visit (HOSPITAL_COMMUNITY)
Admission: RE | Admit: 2023-08-11 | Discharge: 2023-08-11 | Disposition: A | Discharge status: Home / Self Care | Source: Ambulatory Visit | Attending: Urology | Admitting: Urology

## 2023-08-11 DIAGNOSIS — N319 Neuromuscular dysfunction of bladder, unspecified: Secondary | ICD-10-CM | POA: Diagnosis present

## 2023-08-11 DIAGNOSIS — R339 Retention of urine, unspecified: Secondary | ICD-10-CM | POA: Diagnosis present

## 2023-08-11 DIAGNOSIS — E1065 Type 1 diabetes mellitus with hyperglycemia: Secondary | ICD-10-CM | POA: Diagnosis present

## 2023-08-21 NOTE — Progress Notes (Signed)
 Please let pt know: - His RUS showed a mild-to-moderate amount of urine backing up into his left kidney. Trace amount on right side.  - His PVR was significantly improved at the time of this study compared to when he was seen in Urology clinic (PVR = 54 ml on 08/11/23 compared to 486 ml on 07/24/2023).  - Since his renal function was normal on 07/24/2023 I think we can cautiously proceed with observation for now while he continues to work hard on improving his blood sugar control. Advised to follow up as planned on 10/03/2023.

## 2023-08-23 ENCOUNTER — Ambulatory Visit: Admitting: Urology

## 2023-08-29 ENCOUNTER — Encounter: Payer: Self-pay | Admitting: Urology

## 2023-08-29 ENCOUNTER — Other Ambulatory Visit: Payer: Self-pay | Admitting: Urology

## 2023-08-29 ENCOUNTER — Telehealth: Admitting: Urology

## 2023-08-29 DIAGNOSIS — N529 Male erectile dysfunction, unspecified: Secondary | ICD-10-CM

## 2023-08-29 NOTE — Progress Notes (Deleted)
 Name: Barry Pittman DOB: 08-22-1985 MRN: [id number]  This visit was conducted virtually. All issues noted in this document were discussed and addressed.  A physical exam was not performed with this format.  Barry Pittman's identity was verified using two patient identifiers.  Patient's location at the time of today's visit: at ***home / ***work in the state of ***[place] .  Provider's location at the time of today's visit: ***office in the state of *** .  Barry Pittman was advised of the limitations, risks, security, and privacy concerns of performing medical evaluation and management services by video and the alternative option of scheduling an in-person appointment. Patient was advised that there may be charges related to telemedicine visit. The patient expressed understanding and agreed to proceed.  History of Present Illness: Mr. Veillette is a 38 y.o. male who presents today for follow up visit with [facility] [place]. - GU history: 1. Polyuria secondary to uncontrolled T1DM.  - 06/13/2023: Hemoglobin A1c = 9.0. Improved compared to prior. 2. Incomplete bladder emptying with episodes of acute urinary retention.  - Likely related to neurogenic bladder secondary to his T1DM. - 12/12/2022: CT abdomen/pelvis w/ contrast showed no GU stones, masses, or hydronephrosis; bladder and prostate unremarkable. - 01/18/2023: Normal renal function (creatinine 0.79, GFR >60). 3. Erectile dysfunction.  At initial Urology visit on 07/24/2023: - Denied LUTS, flank pain, or UTIs.  - Reported bothersome erectile dysfunction with prior poor response to Cialis. - PVR elevated (486 ml). - The plan was:   Labs (testosterone  and BMP). Renal US . Viagra  (Sildenafil ) 100 mg PRN. Continue working on blood sugar control / follow up with endocrinology.  Return in about 4 weeks (around 08/21/2023) for UA, PVR, & f/u with Griselda Lederer NP.  Since last visit: > 07/24/2023:   - Normal renal function (creatinine 1.02, GFR 96). - Testosterone  normal (773).  > 08/07/2023: STD testing negative for gonorrhea, chlamydia, trichomonas, syphilis, HIV.  > 08/11/2023: RUS showed mild-to-moderate left pelviectasis; trace amount on right side. His PVR was significantly improved at the time of this study (54 ml) compared to when he was seen in Urology clinic (486 ml) on 07/24/2023.   ***Since his renal function was normal on 07/24/2023 I think we can cautiously proceed with observation for now while he continues to work hard on improving his blood sugar control. Advised to follow up as planned on 10/03/2023.  Today: He {Actions; denies-reports:120008} urinary urgency, frequency, nocturia x***, dysuria, gross hematuria, ***weak urinary stream, hesitancy, straining to void, or sensations of incomplete emptying.  He {Actions; denies-reports:120008} constipation.   Medications: Current Outpatient Medications  Medication Sig Dispense Refill   blood glucose meter kit and supplies KIT Dispense based on patient and insurance preference. Use up to four times daily as directed. (FOR ICD-9 250.00, 250.01). 1 each 0   Blood Glucose Monitoring Suppl DEVI 1 each by Does not apply route 3 (three) times daily. May dispense any manufacturer covered by patient's insurance. 1 each 0   Blood Glucose Monitoring Suppl DEVI 1 each by Does not apply route 3 (three) times daily. May dispense any manufacturer covered by patient's insurance. 1 each 0   Continuous Glucose Sensor (DEXCOM G7 SENSOR) MISC Inject 1 Application into the skin as directed. Change sensor every 10 days as directed. 9 each 3   Glucagon  (GVOKE HYPOPEN  1-PACK) 1 MG/0.2ML SOAJ Inject 1 mg as directed daily as needed for up to 1 dose (low blood sugar). 0.2 mL 4  Glucose Blood (BLOOD GLUCOSE TEST STRIPS) STRP 1 each by Does not apply route 3 (three) times daily. Use as directed to check blood sugar. May dispense any manufacturer covered by  patient's insurance and fits patient's device. 100 strip 0   Glucose Blood (BLOOD GLUCOSE TEST STRIPS) STRP 1 each by Does not apply route 3 (three) times daily. Use as directed to check blood sugar. May dispense any manufacturer covered by patient's insurance and fits patient's device. 100 strip 0   Insulin  Disposable Pump (OMNIPOD 5 DEXG7G6 PODS GEN 5) MISC Change pod every 72 hours 10 each 6   Insulin  Human (INSULIN  PUMP) SOLN Inject 1 each into the skin as directed. 1 each 0   insulin  lispro (HUMALOG ) 100 UNIT/ML injection Use with pump for TDD around 100 units daily 60 mL 3   Insulin  Pen Needle (PEN NEEDLES) 31G X 5 MM MISC 1 each by Does not apply route 3 (three) times daily. May dispense any manufacturer covered by patient's insurance. 100 each 0   Insulin  Pen Needle (PEN NEEDLES) 31G X 5 MM MISC 1 each by Does not apply route 3 (three) times daily. May dispense any manufacturer covered by patient's insurance. 100 each 0   Insulin  Pen Needle (PEN NEEDLES) 31G X 5 MM MISC Use as directed. 100 each 1   Insulin  Pump Accessories MISC 1 each by Does not apply route daily. Patient requires Mini-Med Infusion set 1 each 0   Lancet Device MISC 1 each by Does not apply route 3 (three) times daily. May dispense any manufacturer covered by patient's insurance. 1 each 0   Lancet Device MISC 1 each by Does not apply route 3 (three) times daily. May dispense any manufacturer covered by patient's insurance. 1 each 0   Lancets (ACCU-CHEK SOFT TOUCH) lancets Use as directed. 100 each 0   Lancets MISC 1 each by Does not apply route 3 (three) times daily. Use as directed to check blood sugar. May dispense any manufacturer covered by patient's insurance and fits patient's device. 100 each 0   Lancets MISC 1 each by Does not apply route 3 (three) times daily. Use as directed to check blood sugar. May dispense any manufacturer covered by patient's insurance and fits patient's device. 100 each 0   lisinopril  (ZESTRIL )  20 MG tablet Take 1 tablet (20 mg total) by mouth daily. 90 tablet 1   nitroGLYCERIN (NITROSTAT) 0.4 MG SL tablet Place 0.4 mg under the tongue every 5 (five) minutes as needed for chest pain. (Patient not taking: Reported on 07/24/2023)     sildenafil  (VIAGRA ) 100 MG tablet Take 1 tablet (100 mg total) by mouth daily as needed for erectile dysfunction. 20 tablet 0   No current facility-administered medications for this visit.    Allergies: Allergies  Allergen Reactions   Insulin  Aspart (Human Analog) Other (See Comments)    "goes into DKA" Can take Humalog  but not Novolog     Past Medical History:  Diagnosis Date   Diabetes mellitus without complication (HCC)    type 1   Hypertension    Past Surgical History:  Procedure Laterality Date   EYE SURGERY     For retinopathy    Family History  Problem Relation Age of Onset   Diabetes Mother    Heart disease Mother    Diabetes Father    Heart disease Father    Social History   Socioeconomic History   Marital status: Single    Spouse name: Not on  file   Number of children: Not on file   Years of education: Not on file   Highest education level: Not on file  Occupational History   Not on file  Tobacco Use   Smoking status: Never   Smokeless tobacco: Current    Types: Chew  Vaping Use   Vaping status: Never Used  Substance and Sexual Activity   Alcohol use: Yes   Drug use: No   Sexual activity: Yes  Other Topics Concern   Not on file  Social History Narrative   Not on file   Social Drivers of Health   Financial Resource Strain: Not on file  Food Insecurity: No Food Insecurity (01/16/2023)   Hunger Vital Sign    Worried About Running Out of Food in the Last Year: Never true    Ran Out of Food in the Last Year: Never true  Transportation Needs: No Transportation Needs (01/16/2023)   PRAPARE - Administrator, Civil Service (Medical): No    Lack of Transportation (Non-Medical): No  Physical Activity: Not  on file  Stress: Not on file  Social Connections: Moderately Isolated (12/26/2022)   Social Connection and Isolation Panel [NHANES]    Frequency of Communication with Friends and Family: More than three times a week    Frequency of Social Gatherings with Friends and Family: Three times a week    Attends Religious Services: Never    Active Member of Clubs or Organizations: No    Attends Banker Meetings: 1 to 4 times per year    Marital Status: Never married  Intimate Partner Violence: Not At Risk (01/16/2023)   Humiliation, Afraid, Rape, and Kick questionnaire    Fear of Current or Ex-Partner: No    Emotionally Abused: No    Physically Abused: No    Sexually Abused: No    Review of Systems Constitutional: Patient denies any unintentional weight loss or change in strength lntegumentary: Patient denies any rashes or pruritus Cardiovascular: Patient denies chest pain or syncope Respiratory: Patient denies shortness of breath Gastrointestinal: Patient ***denies nausea, vomiting, constipation, or diarrhea ***As per HPI Musculoskeletal: Patient denies muscle cramps or weakness Neurologic: Patient denies convulsions or seizures Psychiatric: Patient denies memory problems Hematologic/Lymphatic: Patient denies bleeding tendencies  GU: As per HPI.  OBJECTIVE There were no vitals filed for this visit. There is no height or weight on file to calculate BMI.  Physical Examination - Limited due to telemedicine encounter Constitutional: No obvious distress; patient is non-toxic appearing  Respiratory: The patient does not have audible wheezing/stridor; respirations do not appear labored  Musculoskeletal: Normal ROM of UEs  Skin: No obvious rashes/open sores  Neurologic: CN 2-12 grossly intact Psychiatric: Answered questions appropriately with normal affect   ASSESSMENT No diagnosis found. ***  We agreed to plan for follow up in *** months / ***1 year or sooner if needed.  Patient verbalized understanding of and agreement with current plan. All questions were answered.  PLAN Advised the following: 1. *** 2. ***No follow-ups on file.  No orders of the defined types were placed in this encounter.   It has been explained that the patient is to follow regularly with their PCP in addition to all other providers involved in their care and to follow instructions provided by these respective offices. Patient advised to contact urology clinic if any urologic-pertaining questions, concerns, new symptoms or problems arise in the interim period.  There are no Patient Instructions on file for this visit.  Electronically signed by:  Lauretta Ponto, MSN, FNP-C, CUNP 08/29/2023 11:33 AM

## 2023-09-12 ENCOUNTER — Ambulatory Visit: Payer: Medicaid Other | Admitting: Nurse Practitioner

## 2023-09-12 ENCOUNTER — Ambulatory Visit: Admitting: Nurse Practitioner

## 2023-09-12 DIAGNOSIS — E1065 Type 1 diabetes mellitus with hyperglycemia: Secondary | ICD-10-CM

## 2023-09-12 DIAGNOSIS — Z794 Long term (current) use of insulin: Secondary | ICD-10-CM

## 2023-09-13 LAB — COMPREHENSIVE METABOLIC PANEL WITH GFR
ALT: 12 IU/L (ref 0–44)
AST: 20 IU/L (ref 0–40)
Albumin: 4.3 g/dL (ref 4.1–5.1)
Alkaline Phosphatase: 94 IU/L (ref 44–121)
BUN/Creatinine Ratio: 17 (ref 9–20)
BUN: 18 mg/dL (ref 6–20)
Bilirubin Total: 0.4 mg/dL (ref 0.0–1.2)
CO2: 21 mmol/L (ref 20–29)
Calcium: 9.7 mg/dL (ref 8.7–10.2)
Chloride: 102 mmol/L (ref 96–106)
Creatinine, Ser: 1.04 mg/dL (ref 0.76–1.27)
Globulin, Total: 2.6 g/dL (ref 1.5–4.5)
Glucose: 132 mg/dL — ABNORMAL HIGH (ref 70–99)
Potassium: 5.3 mmol/L — ABNORMAL HIGH (ref 3.5–5.2)
Sodium: 137 mmol/L (ref 134–144)
Total Protein: 6.9 g/dL (ref 6.0–8.5)
eGFR: 94 mL/min/{1.73_m2} (ref >59)

## 2023-09-13 LAB — TSH: TSH: 1.21 u[IU]/mL (ref 0.450–4.500)

## 2023-09-13 LAB — VITAMIN D 25 HYDROXY (VIT D DEFICIENCY, FRACTURES): Vit D, 25-Hydroxy: 16.3 ng/mL — ABNORMAL LOW (ref 30.0–100.0)

## 2023-09-13 LAB — LIPID PANEL
Chol/HDL Ratio: 2.3 ratio (ref 0.0–5.0)
Cholesterol, Total: 163 mg/dL (ref 100–199)
HDL: 70 mg/dL (ref >39)
LDL Chol Calc (NIH): 83 mg/dL (ref 0–99)
Triglycerides: 45 mg/dL (ref 0–149)
VLDL Cholesterol Cal: 10 mg/dL (ref 5–40)

## 2023-09-13 LAB — T4, FREE: Free T4: 1.38 ng/dL (ref 0.82–1.77)

## 2023-09-19 ENCOUNTER — Telehealth: Payer: Self-pay | Admitting: Nurse Practitioner

## 2023-09-19 ENCOUNTER — Ambulatory Visit: Admitting: Nurse Practitioner

## 2023-09-19 ENCOUNTER — Ambulatory Visit: Admitting: Urology

## 2023-09-19 NOTE — Telephone Encounter (Signed)
 He wont need to repeat prior to that appt.

## 2023-09-19 NOTE — Telephone Encounter (Signed)
Sent pt mychart message to let him know

## 2023-09-19 NOTE — Telephone Encounter (Signed)
 Pt moved appt to Sept, will you put in new labs for that appt

## 2023-10-03 ENCOUNTER — Ambulatory Visit: Admitting: Urology

## 2023-10-13 NOTE — Progress Notes (Deleted)
 Name: Barry Pittman DOB: [birth year] MRN: [id number]  History of Present Illness: Mr. Szabo is a 38 y.o. male who presents today for follow up visit at [facility] [place].  Relevant History includes: 1. Polyuria secondary to uncontrolled T1DM.  2. Incomplete bladder emptying with episodes of acute urinary retention.  - Likely related to neurogenic bladder secondary to his T1DM. 3. Erectile dysfunction.  At initial Urology visit on 07/24/2023: - Asymptomatic aside from ED.   - PVR elevated (486 ml). - The plan was:   1. Labs (testosterone  and BMP). 2. RUS. 3. Viagra  100 mg PRN. 4. Work on blood sugar; follow up with endocrinology.  5. F/u in 4 weeks.   Since last visit: > 07/24/2023:  - Normal renal function (creatinine 1.02, GFR 96). - Testosterone  normal (773).  > 08/07/2023: STD testing negative.  > 08/11/2023: RUS showed mild-to-moderate left pelviectasis; trace amount on right side. PVR significantly improved (54 ml).  ***Since his renal function was normal on 07/24/2023 I think we can cautiously proceed with observation for now while he continues to work hard on improving his blood sugar control. Advised to follow up as planned on 10/03/2023.  Today: He reports ***  He {Actions; denies-reports:120008} increased urinary urgency, frequency, nocturia, dysuria, gross hematuria, hesitancy, straining to void, or sensations of incomplete emptying.   Medications: Current Outpatient Medications  Medication Sig Dispense Refill   blood glucose meter kit and supplies KIT Dispense based on patient and insurance preference. Use up to four times daily as directed. (FOR ICD-9 250.00, 250.01). 1 each 0   Blood Glucose Monitoring Suppl DEVI 1 each by Does not apply route 3 (three) times daily. May dispense any manufacturer covered by patient's insurance. 1 each 0   Blood Glucose Monitoring Suppl DEVI 1 each by Does not apply route 3 (three) times daily. May dispense any  manufacturer covered by patient's insurance. 1 each 0   Continuous Glucose Sensor (DEXCOM G7 SENSOR) MISC Inject 1 Application into the skin as directed. Change sensor every 10 days as directed. 9 each 3   Glucagon  (GVOKE HYPOPEN  1-PACK) 1 MG/0.2ML SOAJ Inject 1 mg as directed daily as needed for up to 1 dose (low blood sugar). 0.2 mL 4   Glucose Blood (BLOOD GLUCOSE TEST STRIPS) STRP 1 each by Does not apply route 3 (three) times daily. Use as directed to check blood sugar. May dispense any manufacturer covered by patient's insurance and fits patient's device. 100 strip 0   Glucose Blood (BLOOD GLUCOSE TEST STRIPS) STRP 1 each by Does not apply route 3 (three) times daily. Use as directed to check blood sugar. May dispense any manufacturer covered by patient's insurance and fits patient's device. 100 strip 0   Insulin  Disposable Pump (OMNIPOD 5 DEXG7G6 PODS GEN 5) MISC Change pod every 72 hours 10 each 6   Insulin  Human (INSULIN  PUMP) SOLN Inject 1 each into the skin as directed. 1 each 0   insulin  lispro (HUMALOG ) 100 UNIT/ML injection Use with pump for TDD around 100 units daily 60 mL 3   Insulin  Pen Needle (PEN NEEDLES) 31G X 5 MM MISC 1 each by Does not apply route 3 (three) times daily. May dispense any manufacturer covered by patient's insurance. 100 each 0   Insulin  Pen Needle (PEN NEEDLES) 31G X 5 MM MISC 1 each by Does not apply route 3 (three) times daily. May dispense any manufacturer covered by patient's insurance. 100 each 0   Insulin  Pen Needle (  PEN NEEDLES) 31G X 5 MM MISC Use as directed. 100 each 1   Insulin  Pump Accessories MISC 1 each by Does not apply route daily. Patient requires Mini-Med Infusion set 1 each 0   Lancet Device MISC 1 each by Does not apply route 3 (three) times daily. May dispense any manufacturer covered by patient's insurance. 1 each 0   Lancet Device MISC 1 each by Does not apply route 3 (three) times daily. May dispense any manufacturer covered by patient's  insurance. 1 each 0   Lancets (ACCU-CHEK SOFT TOUCH) lancets Use as directed. 100 each 0   Lancets MISC 1 each by Does not apply route 3 (three) times daily. Use as directed to check blood sugar. May dispense any manufacturer covered by patient's insurance and fits patient's device. 100 each 0   Lancets MISC 1 each by Does not apply route 3 (three) times daily. Use as directed to check blood sugar. May dispense any manufacturer covered by patient's insurance and fits patient's device. 100 each 0   lisinopril  (ZESTRIL ) 20 MG tablet Take 1 tablet (20 mg total) by mouth daily. 90 tablet 1   nitroGLYCERIN (NITROSTAT) 0.4 MG SL tablet Place 0.4 mg under the tongue every 5 (five) minutes as needed for chest pain. (Patient not taking: Reported on 07/24/2023)     sildenafil  (VIAGRA ) 100 MG tablet Take 1 tablet (100 mg total) by mouth daily as needed for erectile dysfunction. 20 tablet 0   No current facility-administered medications for this visit.    Allergies: Allergies  Allergen Reactions   Insulin  Aspart (Human Analog) Other (See Comments)    goes into DKA Can take Humalog  but not Novolog     Past Medical History:  Diagnosis Date   Diabetes mellitus without complication (HCC)    type 1   Hypertension    Past Surgical History:  Procedure Laterality Date   EYE SURGERY     For retinopathy    Family History  Problem Relation Age of Onset   Diabetes Mother    Heart disease Mother    Diabetes Father    Heart disease Father    Social History   Socioeconomic History   Marital status: Single    Spouse name: Not on file   Number of children: Not on file   Years of education: Not on file   Highest education level: Not on file  Occupational History   Not on file  Tobacco Use   Smoking status: Never   Smokeless tobacco: Current    Types: Chew  Vaping Use   Vaping status: Never Used  Substance and Sexual Activity   Alcohol use: Yes   Drug use: No   Sexual activity: Yes  Other  Topics Concern   Not on file  Social History Narrative   Not on file   Social Drivers of Health   Financial Resource Strain: Not on file  Food Insecurity: No Food Insecurity (01/16/2023)   Hunger Vital Sign    Worried About Running Out of Food in the Last Year: Never true    Ran Out of Food in the Last Year: Never true  Transportation Needs: No Transportation Needs (01/16/2023)   PRAPARE - Administrator, Civil Service (Medical): No    Lack of Transportation (Non-Medical): No  Physical Activity: Not on file  Stress: Not on file  Social Connections: Moderately Isolated (12/26/2022)   Social Connection and Isolation Panel    Frequency of Communication with Friends and  Family: More than three times a week    Frequency of Social Gatherings with Friends and Family: Three times a week    Attends Religious Services: Never    Active Member of Clubs or Organizations: No    Attends Banker Meetings: 1 to 4 times per year    Marital Status: Never married  Intimate Partner Violence: Not At Risk (01/16/2023)   Humiliation, Afraid, Rape, and Kick questionnaire    Fear of Current or Ex-Partner: No    Emotionally Abused: No    Physically Abused: No    Sexually Abused: No    Review of Systems Constitutional: Patient denies any unintentional weight loss or change in strength lntegumentary: Patient denies any rashes or pruritus Cardiovascular: Patient denies chest pain or syncope Respiratory: Patient denies shortness of breath Gastrointestinal: ***Patient denies nausea, vomiting, constipation, or diarrhea ***As per HPI Musculoskeletal: Patient denies muscle cramps or weakness Neurologic: Patient denies convulsions or seizures Allergic/Immunologic: Patient denies recent allergic reaction(s) Hematologic/Lymphatic: Patient denies bleeding tendencies Endocrine: Patient denies heat/cold intolerance  GU: As per HPI.  OBJECTIVE There were no vitals filed for this  visit. There is no height or weight on file to calculate BMI.  Physical Examination Constitutional: No obvious distress; patient is non-toxic appearing  Cardiovascular: No visible lower extremity edema.  Respiratory: The patient does not have audible wheezing/stridor; respirations do not appear labored  Gastrointestinal: Abdomen non-distended Musculoskeletal: Normal ROM of UEs  Skin: No obvious rashes/open sores  Neurologic: CN 2-12 grossly intact Psychiatric: Answered questions appropriately with normal affect  Hematologic/Lymphatic/Immunologic: No obvious bruises or sites of spontaneous bleeding  UA: ***negative ***positive for *** leukocytes, *** blood, ***nitrites Urine microscopy: *** WBC/hpf, *** RBC/hpf, *** bacteria ***glucosuria (secondary to ***Jardiance ***Farxiga use) ***otherwise unremarkable  PVR: *** ml  ASSESSMENT No diagnosis found. ***  We agreed to plan for follow up in *** months / ***1 year or sooner if needed. Patient verbalized understanding of and agreement with current plan. All questions were answered.  PLAN Advised the following: 1. *** 2. ***No follow-ups on file.  No orders of the defined types were placed in this encounter.   It has been explained that the patient is to follow regularly with their PCP in addition to all other providers involved in their care and to follow instructions provided by these respective offices. Patient advised to contact urology clinic if any urologic-pertaining questions, concerns, new symptoms or problems arise in the interim period.  There are no Patient Instructions on file for this visit.  Electronically signed by:  Lauraine JAYSON Oz, FNP   10/13/23    2:18 PM

## 2023-10-17 ENCOUNTER — Ambulatory Visit: Admitting: Urology

## 2023-10-17 DIAGNOSIS — N529 Male erectile dysfunction, unspecified: Secondary | ICD-10-CM

## 2023-10-17 DIAGNOSIS — R339 Retention of urine, unspecified: Secondary | ICD-10-CM

## 2023-10-17 DIAGNOSIS — N319 Neuromuscular dysfunction of bladder, unspecified: Secondary | ICD-10-CM

## 2023-10-23 ENCOUNTER — Other Ambulatory Visit: Payer: Self-pay | Admitting: Nurse Practitioner

## 2023-10-23 ENCOUNTER — Encounter: Payer: Self-pay | Admitting: Nurse Practitioner

## 2023-10-23 MED ORDER — OMNIPOD 5 DEXG7G6 PODS GEN 5 MISC: 6 refills | Status: DC

## 2023-11-01 ENCOUNTER — Other Ambulatory Visit (HOSPITAL_COMMUNITY): Payer: Self-pay

## 2023-11-01 ENCOUNTER — Telehealth: Payer: Self-pay | Admitting: Pharmacy Technician

## 2023-11-01 NOTE — Telephone Encounter (Signed)
 Pharmacy Patient Advocate Encounter   Received notification from Onbase that prior authorization for Dexcom G7 Sensor is required/requested.   Insurance verification completed.   The patient is insured through [employer] .   Per test claim: PA required; PA submitted to above mentioned insurance via CoverMyMeds Key/confirmation #/EOC [id number] Status is pending

## 2023-11-02 ENCOUNTER — Other Ambulatory Visit (HOSPITAL_COMMUNITY): Payer: Self-pay

## 2023-11-02 NOTE — Telephone Encounter (Signed)
 Pharmacy Patient Advocate Encounter  Received notification from [facility] that Prior Authorization for Dexcom G7 Sensor has been APPROVED from 11/01/23 to 10/31/24. Ran test claim, Copay is $0.00. This test claim was processed through [facility]- copay amounts may vary at other pharmacies due to pharmacy/plan contracts, or as the patient moves through the different stages of their insurance plan.   PA #/Case ID/Reference #: [id number]

## 2023-11-08 DIAGNOSIS — N529 Male erectile dysfunction, unspecified: Secondary | ICD-10-CM | POA: Insufficient documentation

## 2023-11-08 NOTE — Progress Notes (Unsigned)
 Name: Barry Pittman DOB: 07-Mar-1986 MRN: [id number]  History of Present Illness: Barry Pittman is a 38 y.o. male who presents today for follow up visit at [facility] [place].  Relevant History includes: 1. Polyuria secondary to uncontrolled T1DM.  2. Incomplete bladder emptying with episodes of acute urinary retention.  - Likely related to neurogenic bladder secondary to his T1DM. 3. Erectile dysfunction.  At initial Urology visit on 07/24/2023: - Asymptomatic aside from ED.   - PVR elevated (486 ml). - The plan was:   1. Labs (testosterone  and BMP). 2. RUS. 3. Viagra  100 mg PRN. 4. Work on blood sugar; follow up with endocrinology.  5. F/u in 4 weeks.  Since last visit: > 07/24/2023:  - Normal renal function (creatinine 1.02, GFR 96). - Testosterone  normal (773).  > 08/07/2023: STD testing negative.  > 08/11/2023: RUS showed mild-to-moderate left pelviectasis; trace amount on right side. PVR significantly improved (54 ml).  > 09/12/2023: Normal renal function (creatinine 1.04, GFR 94).  Today: He reports that his urinary symptoms have been gradually improving as he continues to work diligently on getting his blood sugar under control with Endocrinology.   He reports that the Viagra  100 mg is not  working adequately for management of erectile dysfunction.    Medications: Current Outpatient Medications  Medication Sig Dispense Refill   blood glucose meter kit and supplies KIT Dispense based on patient and insurance preference. Use up to four times daily as directed. (FOR ICD-9 250.00, 250.01). 1 each 0   Blood Glucose Monitoring Suppl DEVI 1 each by Does not apply route 3 (three) times daily. May dispense any manufacturer covered by patient's insurance. 1 each 0   Blood Glucose Monitoring Suppl DEVI 1 each by Does not apply route 3 (three) times daily. May dispense any manufacturer covered by patient's insurance. 1 each 0   Continuous Glucose Sensor (DEXCOM G7  SENSOR) MISC Inject 1 Application into the skin as directed. Change sensor every 10 days as directed. 9 each 3   Glucagon  (GVOKE HYPOPEN  1-PACK) 1 MG/0.2ML SOAJ Inject 1 mg as directed daily as needed for up to 1 dose (low blood sugar). 0.2 mL 4   Glucose Blood (BLOOD GLUCOSE TEST STRIPS) STRP 1 each by Does not apply route 3 (three) times daily. Use as directed to check blood sugar. May dispense any manufacturer covered by patient's insurance and fits patient's device. 100 strip 0   Glucose Blood (BLOOD GLUCOSE TEST STRIPS) STRP 1 each by Does not apply route 3 (three) times daily. Use as directed to check blood sugar. May dispense any manufacturer covered by patient's insurance and fits patient's device. 100 strip 0   Insulin  Disposable Pump (OMNIPOD 5 [id number] PODS GEN 5) MISC Change pod every 72 hours 10 each 6   Insulin  Human (INSULIN  PUMP) SOLN Inject 1 each into the skin as directed. 1 each 0   insulin  lispro (HUMALOG ) 100 UNIT/ML injection Use with pump for TDD around 100 units daily 60 mL 3   Insulin  Pen Needle (PEN NEEDLES) 31G X 5 MM MISC 1 each by Does not apply route 3 (three) times daily. May dispense any manufacturer covered by patient's insurance. 100 each 0   Insulin  Pen Needle (PEN NEEDLES) 31G X 5 MM MISC 1 each by Does not apply route 3 (three) times daily. May dispense any manufacturer covered by patient's insurance. 100 each 0   Insulin  Pen Needle (PEN NEEDLES) 31G X 5 MM MISC Use as  directed. 100 each 1   Insulin  Pump Accessories MISC 1 each by Does not apply route daily. Patient requires Mini-Med Infusion set 1 each 0   Lancet Device MISC 1 each by Does not apply route 3 (three) times daily. May dispense any manufacturer covered by patient's insurance. 1 each 0   Lancet Device MISC 1 each by Does not apply route 3 (three) times daily. May dispense any manufacturer covered by patient's insurance. 1 each 0   Lancets (ACCU-CHEK SOFT TOUCH) lancets Use as directed. 100 each 0    Lancets MISC 1 each by Does not apply route 3 (three) times daily. Use as directed to check blood sugar. May dispense any manufacturer covered by patient's insurance and fits patient's device. 100 each 0   Lancets MISC 1 each by Does not apply route 3 (three) times daily. Use as directed to check blood sugar. May dispense any manufacturer covered by patient's insurance and fits patient's device. 100 each 0   lisinopril  (ZESTRIL ) 20 MG tablet Take 1 tablet (20 mg total) by mouth daily. 90 tablet 1   nitroGLYCERIN (NITROSTAT) 0.4 MG SL tablet Place 0.4 mg under the tongue every 5 (five) minutes as needed for chest pain.     NONFORMULARY OR COMPOUNDED ITEM Prostin 10 mcg/ml + Papaverine 30 mg/ml + Phentolamine 1.0 mg/ml. Dispense 1.0 ml in TB syringe with TB needle. Bring to clinic for intracorporeal injection teaching appointment. 1 each 0   sildenafil  (VIAGRA ) 100 MG tablet Take 1 tablet (100 mg total) by mouth daily as needed for erectile dysfunction. 20 tablet 0   No current facility-administered medications for this visit.    Allergies: Allergies  Allergen Reactions   Insulin  Aspart (Human Analog) (Yeast) Other (See Comments)    goes into DKA Can take Humalog  but not Novolog     Past Medical History:  Diagnosis Date   Diabetes mellitus without complication (HCC)    type 1   Hypertension    Past Surgical History:  Procedure Laterality Date   EYE SURGERY     For retinopathy    Family History  Problem Relation Age of Onset   Diabetes Mother    Heart disease Mother    Diabetes Father    Heart disease Father    Social History   Socioeconomic History   Marital status: Single    Spouse name: Not on file   Number of children: Not on file   Years of education: Not on file   Highest education level: Not on file  Occupational History   Not on file  Tobacco Use   Smoking status: Never   Smokeless tobacco: Current    Types: Chew  Vaping Use   Vaping status: Never Used   Substance and Sexual Activity   Alcohol use: Yes   Drug use: No   Sexual activity: Yes  Other Topics Concern   Not on file  Social History Narrative   Not on file   Social Drivers of Health   Financial Resource Strain: Not on file  Food Insecurity: No Food Insecurity (01/16/2023)   Hunger Vital Sign    Worried About Running Out of Food in the Last Year: Never true    Ran Out of Food in the Last Year: Never true  Transportation Needs: No Transportation Needs (01/16/2023)   PRAPARE - Administrator, Civil Service (Medical): No    Lack of Transportation (Non-Medical): No  Physical Activity: Not on file  Stress: Not on  file  Social Connections: Moderately Isolated (12/26/2022)   Social Connection and Isolation Panel    Frequency of Communication with Friends and Family: More than three times a week    Frequency of Social Gatherings with Friends and Family: Three times a week    Attends Religious Services: Never    Active Member of Clubs or Organizations: No    Attends Banker Meetings: 1 to 4 times per year    Marital Status: Never married  Intimate Partner Violence: Not At Risk (01/16/2023)   Humiliation, Afraid, Rape, and Kick questionnaire    Fear of Current or Ex-Partner: No    Emotionally Abused: No    Physically Abused: No    Sexually Abused: No    Review of Systems Constitutional: Patient denies any unintentional weight loss or change in strength lntegumentary: Patient denies any rashes or pruritus Cardiovascular: Patient denies chest pain or syncope Respiratory: Patient denies shortness of breath Gastrointestinal: Patient reports diarrhea  Musculoskeletal: Patient denies muscle cramps or weakness Neurologic: Patient denies convulsions or seizures Allergic/Immunologic: Patient denies recent allergic reaction(s) Hematologic/Lymphatic: Patient denies bleeding tendencies Endocrine: Patient denies heat/cold intolerance  GU: As per  HPI.  OBJECTIVE Vitals:   11/09/23 1625  BP: (!) 173/94  Pulse: (!) 102   There is no height or weight on file to calculate BMI.  Physical Examination Constitutional: No obvious distress; patient is non-toxic appearing  Cardiovascular: No visible lower extremity edema.  Respiratory: The patient does not have audible wheezing/stridor; respirations do not appear labored  Gastrointestinal: Abdomen non-distended Musculoskeletal: Normal ROM of UEs  Skin: No obvious rashes/open sores  Neurologic: CN 2-12 grossly intact Psychiatric: Answered questions appropriately with normal affect  Hematologic/Lymphatic/Immunologic: No obvious bruises or sites of spontaneous bleeding  UA: glucose 1+, protein 1+,  Urine microscopy: unremarkable  PVR: 290 ml  ASSESSMENT Incomplete bladder emptying - Plan: BLADDER SCAN AMB NON-IMAGING, Urinalysis, Routine w reflex microscopic, US  RENAL  Polyuria  Erectile dysfunction, unspecified erectile dysfunction type - Plan: NONFORMULARY OR COMPOUNDED ITEM  Continues to have incomplete bladder emptying however PVR is much improved compared to prior. His renal function has remained stable and RUS showed no acute findings in April 2025. OK to proceed without catheter at this time; advised to continue his efforts on optimizing his glycemic control. Advised ongoing routine follow up with PCP and Endocrinology along with Urology follow up in 3 months with repeat RUS for surveillance.   For erectile dysfunction we reviewed potential contributing factors (primarily his uncontrolled T1DM) and discussed treatment options. Failed PDE-5 inhibitor oral medication (Viagra ). We agreed to proceed with test dose of Trimix intracorporeal injection at next visit in a few weeks. Patient was cautioned that this may not be covered by insurance.   Patient verbalized understanding of and agreement with current plan. All questions were answered.  PLAN Advised the following: 1. F/u  with NP in a few weeks for Trimix intracorporeal injection teaching. 2. Return in about 3 months (around 02/09/2024) for incomplete bladder emptying, with UA & PVR, will need RUS prior.   Orders Placed This Encounter  Procedures   US  RENAL    Standing Status:   Future    Expected Date:   02/09/2024    Expiration Date:   11/08/2024    Reason for Exam (SYMPTOM  OR DIAGNOSIS REQUIRED):   kidney stone known or suspected    Preferred imaging location?:   [facility]   Urinalysis, Routine w reflex microscopic   BLADDER  SCAN AMB NON-IMAGING    It has been explained that the patient is to follow regularly with their PCP in addition to all other providers involved in their care and to follow instructions provided by these respective offices. Patient advised to contact urology clinic if any urologic-pertaining questions, concerns, new symptoms or problems arise in the interim period.  Patient Instructions  Can also consider: Eroxon (over the counter)     lntracorporal Injections (ICI) for Erectile Dysfunction  Purpose: Penile injection therapy (ICI) is an option for men with erectile dysfunction. It is typically reserved or those who have not had success, cannot tolerate, or who cannot use oral medications (such as Viagra / Sildenafil  or Cialis / Tadalafil) due to their medical comorbidities. How it works: The injectable medications produce vasodilation (relaxation and opening of the blood vessels) in the penis to allow for increased blood flow in the penis, which can produce an erection. The effect usually occurs within 5-15 minutes. Usually lasts for 30-90 minutes. Injections are not effective for all patients. Alternatives to injections include: The use of a vacuum erection device (VED), which is available without a prescription Urethral suppositories Placement of a penile implant (surgery)  Medication: Commonly used medications for ICI include: Prostin / alprostadil (available  as Edex or Caverject) Papaverine Combination products such as: Bimix (prostin + papaverine) Trimix (prostin + papaverine + phentolamine) Of these, Trimix is among the most popular. If a patient notes pain with Trimix, then Bimix may be tried as an alternative. In rare situations, Quadmix may be considered (contains Trimix products+ atropine).  Dosage: The most common starting dose is 0.1 ml. The starting dose is low to avoid complications (such as priapism / dangerously prolonged erection), so do not be discouraged if the result is less than expected at first. Depending on the response, your provider may instruct you to try a higher dose on a subsequent injection (always separated by at least 24 hours), by increasing the volume by 0.025 - 0.1 ml. Do not exceed a dose of 0.5 ml unless directed by your provider.  How to use: Inject as directed prior to intercourse. Alternate injection sites (right versus left) with each use. Hold pressure to injection site for 5 minutes after administration (especially if taking a blood thinner). May be used up to 3 times per week. No more than once per 24 hour period.  Side effects / Risks: Soreness, bruising, or bleeding at injection site  Penile pain, fibrosis Priapism (EMERGENCY) Definition: Erection lasting >2 hours, usually painful. Why it's an emergency: Prolonged erection can cause pain and tissue damage. What to do: Go to the Emergency Room.  An oral dose of Sudafed (pseudoephedrine) 60-120 mg may be taken in cases of priapism of short duration (2-4 hours). Pseudoephedrine promotes vasoconstriction by directly stimulating alpha-adrenergic receptors, which may promote return of blood from the penis into the body.   Storage: Prostin can decrease in efficacy over time. Shelf-life improves with colder storage and protection from light. Refrigeration is better than room temperature, but freezer storage is most effective and should preserve efficacy  for up to 180 days in most cases. Do not use medication if it becomes cloudy or contains particles, or if the integrity of the vial appears compromised.    Electronically signed by:  Lauraine JAYSON Oz, FNP   11/09/23    4:52 PM

## 2023-11-09 ENCOUNTER — Ambulatory Visit (INDEPENDENT_AMBULATORY_CARE_PROVIDER_SITE_OTHER): Admitting: Urology

## 2023-11-09 ENCOUNTER — Encounter: Payer: Self-pay | Admitting: Urology

## 2023-11-09 VITALS — BP 173/94 | HR 102

## 2023-11-09 DIAGNOSIS — N529 Male erectile dysfunction, unspecified: Secondary | ICD-10-CM

## 2023-11-09 DIAGNOSIS — R3589 Other polyuria: Secondary | ICD-10-CM

## 2023-11-09 DIAGNOSIS — R339 Retention of urine, unspecified: Secondary | ICD-10-CM

## 2023-11-09 LAB — BLADDER SCAN AMB NON-IMAGING: Scan Result: 290

## 2023-11-09 MED ORDER — NONFORMULARY OR COMPOUNDED ITEM: 0 refills | Status: DC

## 2023-11-09 NOTE — Patient Instructions (Addendum)
 Can also consider: Eroxon (over the counter)     lntracorporal Injections (ICI) for Erectile Dysfunction  Purpose: Penile injection therapy (ICI) is an option for men with erectile dysfunction. It is typically reserved or those who have not had success, cannot tolerate, or who cannot use oral medications (such as Viagra / Sildenafil  or Cialis / Tadalafil) due to their medical comorbidities. How it works: The injectable medications produce vasodilation (relaxation and opening of the blood vessels) in the penis to allow for increased blood flow in the penis, which can produce an erection. The effect usually occurs within 5-15 minutes. Usually lasts for 30-90 minutes. Injections are not effective for all patients. Alternatives to injections include: The use of a vacuum erection device (VED), which is available without a prescription Urethral suppositories Placement of a penile implant (surgery)  Medication: Commonly used medications for ICI include: Prostin / alprostadil (available as Edex or Caverject) Papaverine Combination products such as: Bimix (prostin + papaverine) Trimix (prostin + papaverine + phentolamine) Of these, Trimix is among the most popular. If a patient notes pain with Trimix, then Bimix may be tried as an alternative. In rare situations, Quadmix may be considered (contains Trimix products+ atropine).  Dosage: The most common starting dose is 0.1 ml. The starting dose is low to avoid complications (such as priapism / dangerously prolonged erection), so do not be discouraged if the result is less than expected at first. Depending on the response, your provider may instruct you to try a higher dose on a subsequent injection (always separated by at least 24 hours), by increasing the volume by 0.025 - 0.1 ml. Do not exceed a dose of 0.5 ml unless directed by your provider.  How to use: Inject as directed prior to intercourse. Alternate injection sites (right versus  left) with each use. Hold pressure to injection site for 5 minutes after administration (especially if taking a blood thinner). May be used up to 3 times per week. No more than once per 24 hour period.  Side effects / Risks: Soreness, bruising, or bleeding at injection site  Penile pain, fibrosis Priapism (EMERGENCY) Definition: Erection lasting >2 hours, usually painful. Why it's an emergency: Prolonged erection can cause pain and tissue damage. What to do: Go to the Emergency Room.  An oral dose of Sudafed (pseudoephedrine) 60-120 mg may be taken in cases of priapism of short duration (2-4 hours). Pseudoephedrine promotes vasoconstriction by directly stimulating alpha-adrenergic receptors, which may promote return of blood from the penis into the body.  Storage: Prostin can decrease in efficacy over time. Shelf-life improves with colder storage and protection from light. Refrigeration is better than room temperature, but freezer storage is most effective and should preserve efficacy for up to 180 days in most cases. Do not use medication if it becomes cloudy or contains particles, or if the integrity of the vial appears compromised.

## 2023-11-10 LAB — URINALYSIS, ROUTINE W REFLEX MICROSCOPIC
Bilirubin, UA: NEGATIVE
Ketones, UA: NEGATIVE
Leukocytes,UA: NEGATIVE
Nitrite, UA: NEGATIVE
Specific Gravity, UA: 1.015 (ref 1.005–1.030)
Urobilinogen, Ur: 0.2 mg/dL (ref 0.2–1.0)
pH, UA: 6 (ref 5.0–7.5)

## 2023-11-10 LAB — MICROSCOPIC EXAMINATION
Bacteria, UA: NONE SEEN
WBC, UA: NONE SEEN /HPF (ref 0–5)

## 2023-12-08 ENCOUNTER — Ambulatory Visit: Admitting: Urology

## 2023-12-08 ENCOUNTER — Other Ambulatory Visit: Payer: Self-pay | Admitting: Nurse Practitioner

## 2023-12-27 ENCOUNTER — Encounter: Payer: Self-pay | Admitting: Nurse Practitioner

## 2023-12-27 ENCOUNTER — Ambulatory Visit: Admitting: Nurse Practitioner

## 2023-12-27 DIAGNOSIS — Z794 Long term (current) use of insulin: Secondary | ICD-10-CM

## 2023-12-27 DIAGNOSIS — E1065 Type 1 diabetes mellitus with hyperglycemia: Secondary | ICD-10-CM

## 2023-12-28 ENCOUNTER — Other Ambulatory Visit: Payer: Self-pay

## 2023-12-28 DIAGNOSIS — N529 Male erectile dysfunction, unspecified: Secondary | ICD-10-CM

## 2023-12-28 NOTE — Telephone Encounter (Signed)
 No, he wont need to repeat the labs.

## 2024-01-01 ENCOUNTER — Telehealth: Payer: Self-pay

## 2024-01-01 NOTE — Telephone Encounter (Signed)
 Pt state he lost medication and wanted a sample before upcoming appointment. Pt is advised he has upcoming appointment and RX will be refilled at that time. Verbalized understanding.

## 2024-01-01 NOTE — Telephone Encounter (Signed)
 Pharmacy called about trimex refill pharmacist was advised pt has an upcoming appt and Rx will be refilled at that appt if deemed appropriate by provider

## 2024-01-02 ENCOUNTER — Other Ambulatory Visit: Payer: Self-pay

## 2024-01-02 DIAGNOSIS — N529 Male erectile dysfunction, unspecified: Secondary | ICD-10-CM

## 2024-01-02 MED ORDER — NONFORMULARY OR COMPOUNDED ITEM: 0 refills | Status: DC

## 2024-01-02 MED ORDER — NONFORMULARY OR COMPOUNDED ITEM
0 refills | Status: AC
Start: 2024-01-02 — End: ?

## 2024-01-02 MED ORDER — NONFORMULARY OR COMPOUNDED ITEM
0 refills | Status: DC
Start: 2024-01-02 — End: 2024-01-02

## 2024-01-03 ENCOUNTER — Encounter: Payer: Self-pay | Admitting: Nurse Practitioner

## 2024-01-03 ENCOUNTER — Encounter (INDEPENDENT_AMBULATORY_CARE_PROVIDER_SITE_OTHER): Admitting: Urology

## 2024-01-03 ENCOUNTER — Ambulatory Visit (INDEPENDENT_AMBULATORY_CARE_PROVIDER_SITE_OTHER): Admitting: Nurse Practitioner

## 2024-01-03 VITALS — BP 146/96 | HR 75 | Ht 71.0 in | Wt 235.8 lb

## 2024-01-03 DIAGNOSIS — Z794 Long term (current) use of insulin: Secondary | ICD-10-CM

## 2024-01-03 DIAGNOSIS — E1065 Type 1 diabetes mellitus with hyperglycemia: Secondary | ICD-10-CM | POA: Diagnosis not present

## 2024-01-03 DIAGNOSIS — N529 Male erectile dysfunction, unspecified: Secondary | ICD-10-CM

## 2024-01-03 LAB — POCT GLYCOSYLATED HEMOGLOBIN (HGB A1C): Hemoglobin A1C: 9.1 % — AB (ref 4.0–5.6)

## 2024-01-03 LAB — POCT UA - MICROALBUMIN
Albumin/Creatinine Ratio, Urine, POC: >300
Creatinine, POC: 50 mg/dL

## 2024-01-03 MED ORDER — DEXCOM G7 SENSOR MISC: 1.0000 | 3 refills | Status: DC

## 2024-01-03 MED ORDER — AMBULATORY NON FORMULARY MEDICATION: 0.2000 mL | 5 refills | Status: AC | PRN

## 2024-01-03 MED ORDER — AMBULATORY NON FORMULARY MEDICATION: 0.2000 mL | 5 refills | Status: DC | PRN

## 2024-01-03 NOTE — Progress Notes (Unsigned)
 Patient rescheduled.

## 2024-01-03 NOTE — Progress Notes (Signed)
 Endocrinology Follow Up Note       01/03/2024, 12:30 PM   Subjective:    Patient ID: Barry Pittman, male    DOB: 1985/12/30.  Barry Pittman is being seen in follow up after being seen in consultation for management of currently uncontrolled symptomatic diabetes requested by  Patient, No Pcp Per.   Past Medical History:  Diagnosis Date   Diabetes mellitus without complication (HCC)    type 1   Hypertension     Past Surgical History:  Procedure Laterality Date   EYE SURGERY     For retinopathy     Social History   Socioeconomic History   Marital status: Single    Spouse name: Not on file   Number of children: Not on file   Years of education: Not on file   Highest education level: Not on file  Occupational History   Not on file  Tobacco Use   Smoking status: Never   Smokeless tobacco: Current    Types: Chew  Vaping Use   Vaping status: Never Used  Substance and Sexual Activity   Alcohol use: Yes   Drug use: No   Sexual activity: Yes  Other Topics Concern   Not on file  Social History Narrative   Not on file   Social Drivers of Health   Financial Resource Strain: Not on file  Food Insecurity: No Food Insecurity (01/16/2023)   Hunger Vital Sign    Worried About Running Out of Food in the Last Year: Never true    Ran Out of Food in the Last Year: Never true  Transportation Needs: No Transportation Needs (01/16/2023)   PRAPARE - Administrator, Civil Service (Medical): No    Lack of Transportation (Non-Medical): No  Physical Activity: Not on file  Stress: Not on file  Social Connections: Moderately Isolated (12/26/2022)   Social Connection and Isolation Panel    Frequency of Communication with Friends and Family: More than three times a week    Frequency of Social Gatherings with Friends and Family: Three times a week    Attends Religious Services: Never    Active  Member of Clubs or Organizations: No    Attends Banker Meetings: 1 to 4 times per year    Marital Status: Never married    Family History  Problem Relation Age of Onset   Diabetes Mother    Heart disease Mother    Diabetes Father    Heart disease Father     Outpatient Encounter Medications as of 01/03/2024  Medication Sig   blood glucose meter kit and supplies KIT Dispense based on patient and insurance preference. Use up to four times daily as directed. (FOR ICD-9 250.00, 250.01).   Blood Glucose Monitoring Suppl DEVI 1 each by Does not apply route 3 (three) times daily. May dispense any manufacturer covered by patient's insurance.   Blood Glucose Monitoring Suppl DEVI 1 each by Does not apply route 3 (three) times daily. May dispense any manufacturer covered by patient's insurance.   Glucagon  (GVOKE HYPOPEN  1-PACK) 1 MG/0.2ML SOAJ Inject 1 mg as directed daily as needed for up to 1 dose (low  blood sugar).   Glucose Blood (BLOOD GLUCOSE TEST STRIPS) STRP 1 each by Does not apply route 3 (three) times daily. Use as directed to check blood sugar. May dispense any manufacturer covered by patient's insurance and fits patient's device.   Glucose Blood (BLOOD GLUCOSE TEST STRIPS) STRP 1 each by Does not apply route 3 (three) times daily. Use as directed to check blood sugar. May dispense any manufacturer covered by patient's insurance and fits patient's device.   Insulin  Disposable Pump (OMNIPOD 5 [id number] PODS GEN 5) MISC Change pod every 72 hours   Insulin  Human (INSULIN  PUMP) SOLN Inject 1 each into the skin as directed.   insulin  lispro (HUMALOG ) 100 UNIT/ML injection USE WITH INSULIN  PUMP FOR TDD AROUND 100 UNITS DAILY.   Insulin  Pen Needle (PEN NEEDLES) 31G X 5 MM MISC 1 each by Does not apply route 3 (three) times daily. May dispense any manufacturer covered by patient's insurance.   Insulin  Pen Needle (PEN NEEDLES) 31G X 5 MM MISC 1 each by Does not apply route 3 (three) times  daily. May dispense any manufacturer covered by patient's insurance.   Insulin  Pen Needle (PEN NEEDLES) 31G X 5 MM MISC Use as directed.   Insulin  Pump Accessories MISC 1 each by Does not apply route daily. Patient requires Mini-Med Infusion set   Lancet Device MISC 1 each by Does not apply route 3 (three) times daily. May dispense any manufacturer covered by patient's insurance.   Lancet Device MISC 1 each by Does not apply route 3 (three) times daily. May dispense any manufacturer covered by patient's insurance.   Lancets (ACCU-CHEK SOFT TOUCH) lancets Use as directed.   Lancets MISC 1 each by Does not apply route 3 (three) times daily. Use as directed to check blood sugar. May dispense any manufacturer covered by patient's insurance and fits patient's device.   lisinopril  (ZESTRIL ) 20 MG tablet Take 1 tablet (20 mg total) by mouth daily.   nitroGLYCERIN (NITROSTAT) 0.4 MG SL tablet Place 0.4 mg under the tongue every 5 (five) minutes as needed for chest pain.   NONFORMULARY OR COMPOUNDED ITEM Prostin 10 mcg/ml + Papaverine 30 mg/ml + Phentolamine 1.0 mg/ml. Dispense 1.0 ml in TB syringe with TB needle. Bring to clinic for intracorporeal injection teaching appointment.   sildenafil  (VIAGRA ) 100 MG tablet Take 1 tablet (100 mg total) by mouth daily as needed for erectile dysfunction.   [DISCONTINUED] Continuous Glucose Sensor (DEXCOM G7 SENSOR) MISC Inject 1 Application into the skin as directed. Change sensor every 10 days as directed.   Continuous Glucose Sensor (DEXCOM G7 SENSOR) MISC Inject 1 Application into the skin as directed. Change sensor every 10 days as directed.   Lancets MISC 1 each by Does not apply route 3 (three) times daily. Use as directed to check blood sugar. May dispense any manufacturer covered by patient's insurance and fits patient's device.   [DISCONTINUED] NONFORMULARY OR COMPOUNDED ITEM Prostin 10 mcg/ml + Papaverine 30 mg/ml + Phentolamine 1.0 mg/ml. Dispense 1.0 ml in TB  syringe with TB needle. Bring to clinic for intracorporeal injection teaching appointment.   No facility-administered encounter medications on file as of 01/03/2024.    ALLERGIES: Allergies  Allergen Reactions   Insulin  Aspart (Human Analog) (Yeast) Other (See Comments)    goes into DKA Can take Humalog  but not Novolog     VACCINATION STATUS: Immunization History  Administered Date(s) Administered   Influenza,inj,Quad PF,6+ Mos 12/25/2017   Tdap 08/18/2014    Diabetes He presents  for his follow-up diabetic visit. He has type 1 diabetes mellitus. Onset time: diagnosed at approx age of 79. His disease course has been fluctuating. There are no hypoglycemic associated symptoms. Associated symptoms include blurred vision and fatigue. Pertinent negatives for diabetes include no polydipsia, no polyuria and no weight loss. There are no hypoglycemic complications. Symptoms are improving. (Multiple episodes of DKA) Risk factors for coronary artery disease include diabetes mellitus, family history, male sex and hypertension. Current diabetic treatment includes insulin  pump (Omnipod 5). He is compliant with treatment most of the time. His weight is increasing steadily. He is following a generally unhealthy diet. When asked about meal planning, he reported none. He has not had a previous visit with a dietitian. He participates in exercise intermittently. His home blood glucose trend is fluctuating dramatically. His overall blood glucose range is >200 mg/dl. (He presents today with his Omnipod 5 Dexcom combo showing limited data (just recently starting using it yesterday) and fluctuating patterns overall.  His POCT A1c today is 9.1%,essentially unchanged from previous visit.  Analysis of his CGM shows TIR 38%, TAR 62%, TBR 0%.  He did lose his PDM at one point and had to put in own settings once he got a new one, thus they are a bit off.  He did change over to using his Omnipod 5 app on his phone which he  likes much more.) An ACE inhibitor/angiotensin II receptor blocker is being taken. He does not see a podiatrist.Eye exam is current.     Review of systems  Constitutional: + increasing body weight, current Body mass index is 32.89 kg/m., no fatigue, no subjective hyperthermia, no subjective hypothermia Eyes: no blurry vision, no xerophthalmia ENT: no sore throat, no nodules palpated in throat, no dysphagia/odynophagia, no hoarseness Cardiovascular: no chest pain, no shortness of breath, no palpitations, no leg swelling Respiratory: no cough, no shortness of breath Gastrointestinal: no nausea/vomiting/diarrhea Musculoskeletal: no muscle/joint aches Skin: no rashes, no hyperemia Neurological: no tremors, + numbness/ tingling/ burning pain to bilateral feet (worse at night), no dizziness Psychiatric: no depression, no anxiety  Objective:     BP (!) 146/96 (BP Location: Right Arm, Patient Position: Sitting, Cuff Size: Large) Comment: Retake BP. Patient states that he just took his BP medication.  Pulse 75   Ht 5' 11 (1.803 m)   Wt 235 lb 12.8 oz (107 kg)   BMI 32.89 kg/m   Wt Readings from Last 3 Encounters:  01/03/24 235 lb 12.8 oz (107 kg)  06/13/23 211 lb 6.4 oz (95.9 kg)  03/03/23 200 lb 9.6 oz (91 kg)     BP Readings from Last 3 Encounters:  01/03/24 (!) 146/96  11/09/23 (!) 173/94  08/07/23 (!) 171/100     Physical Exam- Limited  Constitutional:  Body mass index is 32.89 kg/m. , not in acute distress, normal state of mind Eyes:  EOMI, no exophthalmos Musculoskeletal: no gross deformities, strength intact in all four extremities, no gross restriction of joint movements Skin:  no rashes, no hyperemia Neurological: no tremor with outstretched hands   Diabetic Foot Exam - Simple   Simple Foot Form Diabetic Foot exam was performed with the following findings: Yes 01/03/2024 11:55 AM  Visual Inspection No deformities, no ulcerations, no other skin breakdown  bilaterally: Yes Sensation Testing Intact to touch and monofilament testing bilaterally: Yes Pulse Check Posterior Tibialis and Dorsalis pulse intact bilaterally: Yes Comments      CMP ( most recent) CMP     Component Value  Date/Time   NA 137 09/12/2023 0820   K 5.3 (H) 09/12/2023 0820   CL 102 09/12/2023 0820   CO2 21 09/12/2023 0820   GLUCOSE 132 (H) 09/12/2023 0820   GLUCOSE 242 (H) 01/18/2023 0448   BUN 18 09/12/2023 0820   CREATININE 1.04 09/12/2023 0820   CALCIUM  9.7 09/12/2023 0820   PROT 6.9 09/12/2023 0820   ALBUMIN 4.3 09/12/2023 0820   AST 20 09/12/2023 0820   ALT 12 09/12/2023 0820   ALKPHOS 94 09/12/2023 0820   BILITOT 0.4 09/12/2023 0820   EGFR 94 09/12/2023 0820   GFRNONAA >60 01/18/2023 0448     Diabetic Labs (most recent): Lab Results  Component Value Date   HGBA1C 9.1 (A) 01/03/2024   HGBA1C 9.0 (A) 06/13/2023   HGBA1C 13.1 (H) 12/25/2022   MICROALBUR 150mg /L 01/03/2024     Lipid Panel ( most recent) Lipid Panel     Component Value Date/Time   CHOL 163 09/12/2023 0820   TRIG 45 09/12/2023 0820   HDL 70 09/12/2023 0820   CHOLHDL 2.3 09/12/2023 0820   CHOLHDL 3 12/25/2017 1406   VLDL 21.0 12/25/2017 1406   LDLCALC 83 09/12/2023 0820   LABVLDL 10 09/12/2023 0820      Lab Results  Component Value Date   TSH 1.210 09/12/2023   TSH 0.97 12/25/2017   TSH 0.87 12/06/2016   FREET4 1.38 09/12/2023           Assessment & Plan:   1) Type 1 diabetes mellitus with hyperglycemia (HCC)  He presents today with his Omnipod 5 Dexcom combo showing limited data (just recently starting using it yesterday) and fluctuating patterns overall.  His POCT A1c today is 9.1%,essentially unchanged from previous visit.  Analysis of his CGM shows TIR 38%, TAR 62%, TBR 0%.  He did lose his PDM at one point and had to put in own settings once he got a new one, thus they are a bit off.  He did change over to using his Omnipod 5 app on his phone which he likes  much more.  - Barry Pittman has currently uncontrolled symptomatic type 1 DM since 38 years of age.  -Recent labs reviewed.  His POCT UM shows significant proteinuria- likely related to higher sugar readings lately.  Will recheck CMP prior to next visit.  - I had a long discussion with him about the progressive nature of diabetes and the pathology behind its complications. -his diabetes is complicated by multiple episodes of DKA, retinopathy, and he remains at a high risk for more acute and chronic complications which include CAD, CVA, CKD, retinopathy, and neuropathy. These are all discussed in detail with him.  The following Lifestyle Medicine recommendations according to American College of Lifestyle Medicine [facility]) were discussed and offered to patient and he agrees to start the journey:  A. Whole Foods, Plant-based plate comprising of fruits and vegetables, plant-based proteins, whole-grain carbohydrates was discussed in detail with the patient.   A list for source of those nutrients were also provided to the patient.  Patient will use only water or unsweetened tea for hydration. B.  The need to stay away from risky substances including alcohol, smoking; obtaining 7 to 9 hours of restorative sleep, at least 150 minutes of moderate intensity exercise weekly, the importance of healthy social connections,  and stress reduction techniques were discussed. C.  A full color page of  Calorie density of various food groups per pound showing examples of each food groups  was provided to the patient.  - Nutritional counseling repeated at each appointment due to patients tendency to fall back in to old habits.  - The patient admits there is a room for improvement in their diet and drink choices. -  Suggestion is made for the patient to avoid simple carbohydrates from their diet including Cakes, Sweet Desserts / Pastries, Ice Cream, Soda (diet and regular), Sweet Tea, Candies, Chips, Cookies, Sweet  Pastries, Store Bought Juices, Alcohol in Excess of 1-2 drinks a day, Artificial Sweeteners, Coffee Creamer, and Sugar-free Products. This will help patient to have stable blood glucose profile and potentially avoid unintended weight gain.   - I encouraged the patient to switch to unprocessed or minimally processed complex starch and increased protein intake (animal or plant source), fruits, and vegetables.   - Patient is advised to stick to a routine mealtimes to eat 3 meals a day and avoid unnecessary snacks (to snack only to correct hypoglycemia).  - I have approached him with the following individualized plan to manage his diabetes and patient agrees:   -I did help him adjust his settings back to original settings in his phone.  I also encouraged him to ensure he boluses before each meal.  -he is encouraged to continue monitoring glucose 4 times daily (using his CGM), before meals and before bed, and to call the clinic if he has readings less than 70 or above 300 for 3 tests in a row.  - he is warned not to take insulin  without proper monitoring per orders. - Adjustment parameters are given to him for hypo and hyperglycemia in writing.  -He is not a candidate for noninsulin therapies due to type 1 diagnosis.  Insulin  is the exclusive choice to manage his diabetes.  He does note that he cannot take Novolog , notes that it made him go into DKA in the past.  - Specific targets for  A1c; LDL, HDL, and Triglycerides were discussed with the patient.  2) Blood Pressure /Hypertension:  his blood pressure is controlled to target.   he is advised to continue his current medications including Lisinopril  20 mg p.o. daily with breakfast.  3) Lipids/Hyperlipidemia:    His recent lipid panel from 09/12/23 shows controlled LDL of 83.  He is not currently on any lipid lowering medications at this time.  4)  Weight/Diet:  his Body mass index is 32.89 kg/m.  -   he is NOT a candidate for weight loss.   Exercise, and detailed carbohydrates information provided  -  detailed on discharge instructions.  5) Chronic Care/Health Maintenance: -he is on ACEI/ARB and not on Statin medications and is encouraged to initiate and continue to follow up with Ophthalmology, Dentist, Podiatrist at least yearly or according to recommendations, and advised to stay away from smoking. I have recommended yearly flu vaccine and pneumonia vaccine at least every 5 years; moderate intensity exercise for up to 150 minutes weekly; and sleep for at least 7 hours a day.  - he is advised to maintain close follow up with Patient, No Pcp Per for primary care needs, as well as his other providers for optimal and coordinated care.     I spent  54  minutes in the care of the patient today including review of labs from CMP, Lipids, Thyroid  Function, Hematology (current and previous including abstractions from other facilities); face-to-face time discussing  his blood glucose readings/logs, discussing hypoglycemia and hyperglycemia episodes and symptoms, medications doses, his options of short and long  term treatment based on the latest standards of care / guidelines;  discussion about incorporating lifestyle medicine;  and documenting the encounter. Risk reduction counseling performed per USPSTF guidelines to reduce obesity and cardiovascular risk factors.     Please refer to Patient Instructions for Blood Glucose Monitoring and Insulin /Medications Dosing Guide  in media tab for additional information. Please  also refer to  Patient Self Inventory in the Media  tab for reviewed elements of pertinent patient history.  Barry Pittman participated in the discussions, expressed understanding, and voiced agreement with the above plans.  All questions were answered to his satisfaction. he is encouraged to contact clinic should he have any questions or concerns prior to his return visit.     Follow up plan: - Return in about 3  months (around 04/03/2024) for Diabetes F/U, Bring meter and logs.   Benton Rio, [facility] [address] [place], [place] [address] Phone: [phone] Fax: [phone]  01/03/2024, 12:30 PM

## 2024-01-05 ENCOUNTER — Encounter: Payer: Self-pay | Admitting: Urology

## 2024-01-05 ENCOUNTER — Ambulatory Visit: Admitting: Urology

## 2024-01-05 VITALS — BP 174/99

## 2024-01-05 DIAGNOSIS — N529 Male erectile dysfunction, unspecified: Secondary | ICD-10-CM

## 2024-01-05 NOTE — Progress Notes (Signed)
 01/05/2024 9:37 AM   Barry Pittman Sar [birth year] [id number]  Referring provider: No referring provider defined for this encounter.  Erectile dysfunction   HPI: Barry Pittman is a 37yo here for followup for erectile dysfunction. He is here for trimix teaching.    PMH: Past Medical History:  Diagnosis Date   Diabetes mellitus without complication (HCC)    type 1   Hypertension     Surgical History: Past Surgical History:  Procedure Laterality Date   EYE SURGERY     For retinopathy     Home Medications:  Allergies as of 01/05/2024       Reactions   Insulin  Aspart (human Analog) (yeast) Other (See Comments)   goes into DKA Can take Humalog  but not Novolog         Medication List        Accurate as of January 05, 2024  9:37 AM. If you have any questions, ask your nurse or doctor.          accu-chek soft touch lancets Use as directed.   Lancets Misc 1 each by Does not apply route 3 (three) times daily. Use as directed to check blood sugar. May dispense any manufacturer covered by patient's insurance and fits patient's device.   Lancets Misc 1 each by Does not apply route 3 (three) times daily. Use as directed to check blood sugar. May dispense any manufacturer covered by patient's insurance and fits patient's device.   AMBULATORY NON FORMULARY MEDICATION 0.2 mLs by Intracavernosal route as needed. Medication Name: Trimix  PGE 30mcg Pap 30mg  Phent 1mg    blood glucose meter kit and supplies Kit Dispense based on patient and insurance preference. Use up to four times daily as directed. (FOR ICD-9 250.00, 250.01).   Blood Glucose Monitoring Suppl Devi 1 each by Does not apply route 3 (three) times daily. May dispense any manufacturer covered by patient's insurance.   Blood Glucose Monitoring Suppl Devi 1 each by Does not apply route 3 (three) times daily. May dispense any manufacturer covered by patient's insurance.   BLOOD GLUCOSE TEST STRIPS  Strp 1 each by Does not apply route 3 (three) times daily. Use as directed to check blood sugar. May dispense any manufacturer covered by patient's insurance and fits patient's device.   BLOOD GLUCOSE TEST STRIPS Strp 1 each by Does not apply route 3 (three) times daily. Use as directed to check blood sugar. May dispense any manufacturer covered by patient's insurance and fits patient's device.   Dexcom G7 Sensor Misc Inject 1 Application into the skin as directed. Change sensor every 10 days as directed.   Gvoke HypoPen  1-Pack 1 MG/0.2ML Soaj Generic drug: Glucagon  Inject 1 mg as directed daily as needed for up to 1 dose (low blood sugar).   insulin  lispro 100 UNIT/ML injection Commonly known as: HUMALOG  USE WITH INSULIN  PUMP FOR TDD AROUND 100 UNITS DAILY.   Insulin  Pump Accessories Misc 1 each by Does not apply route daily. Patient requires Mini-Med Infusion set   insulin  pump Soln Inject 1 each into the skin as directed.   Lancet Device Misc 1 each by Does not apply route 3 (three) times daily. May dispense any manufacturer covered by patient's insurance.   Lancet Device Misc 1 each by Does not apply route 3 (three) times daily. May dispense any manufacturer covered by patient's insurance.   lisinopril  20 MG tablet Commonly known as: ZESTRIL  Take 1 tablet (20 mg total) by mouth daily.   Nitrostat 0.4  MG SL tablet Generic drug: nitroGLYCERIN Place 0.4 mg under the tongue every 5 (five) minutes as needed for chest pain.   NONFORMULARY OR COMPOUNDED ITEM Prostin 10 mcg/ml + Papaverine 30 mg/ml + Phentolamine 1.0 mg/ml. Dispense 1.0 ml in TB syringe with TB needle. Bring to clinic for intracorporeal injection teaching appointment.   Omnipod 5 DexG7G6 Pods Gen 5 Misc Change pod every 72 hours   Pen Needles 31G X 5 MM Misc 1 each by Does not apply route 3 (three) times daily. May dispense any manufacturer covered by patient's insurance.   Pen Needles 31G X 5 MM Misc 1  each by Does not apply route 3 (three) times daily. May dispense any manufacturer covered by patient's insurance.   Pen Needles 31G X 5 MM Misc Use as directed.   sildenafil  100 MG tablet Commonly known as: VIAGRA  Take 1 tablet (100 mg total) by mouth daily as needed for erectile dysfunction.        Allergies:  Allergies  Allergen Reactions   Insulin  Aspart (Human Analog) (Yeast) Other (See Comments)    goes into DKA Can take Humalog  but not Novolog     Family History: Family History  Problem Relation Age of Onset   Diabetes Mother    Heart disease Mother    Diabetes Father    Heart disease Father     Social History:  reports that he has never smoked. His smokeless tobacco use includes chew. He reports current alcohol use. He reports that he does not use drugs.  ROS: All other review of systems were reviewed and are negative except what is noted above in HPI  Physical Exam: BP (!) 174/99   Constitutional:  Alert and oriented, No acute distress. HEENT: [place] AT, moist mucus membranes.  Trachea midline, no masses. Cardiovascular: No clubbing, cyanosis, or edema. Respiratory: Normal respiratory effort, no increased work of breathing. GI: Abdomen is soft, nontender, nondistended, no abdominal masses GU: No CVA tenderness.  Lymph: No cervical or inguinal lymphadenopathy. Skin: No rashes, bruises or suspicious lesions. Neurologic: Grossly intact, no focal deficits, moving all 4 extremities. Psychiatric: Normal mood and affect.  Laboratory Data: Lab Results  Component Value Date   WBC 9.2 01/18/2023   HGB 11.4 (L) 01/18/2023   HCT 34.6 (L) 01/18/2023   MCV 90.3 01/18/2023   PLT 270 01/18/2023    Lab Results  Component Value Date   CREATININE 1.04 09/12/2023    No results found for: PSA  Lab Results  Component Value Date   TESTOSTERONE  773 07/24/2023    Lab Results  Component Value Date   HGBA1C 9.1 (A) 01/03/2024    Urinalysis    Component Value  Date/Time   COLORURINE YELLOW 12/25/2022 0101   APPEARANCEUR Clear 11/09/2023 1620   LABSPEC 1.026 12/25/2022 0101   PHURINE 5.0 12/25/2022 0101   GLUCOSEU 1+ (A) 11/09/2023 1620   HGBUR SMALL (A) 12/25/2022 0101   BILIRUBINUR Negative 11/09/2023 1620   KETONESUR 80 (A) 12/25/2022 0101   PROTEINUR 1+ (A) 11/09/2023 1620   PROTEINUR 30 (A) 12/25/2022 0101   NITRITE Negative 11/09/2023 1620   NITRITE NEGATIVE 12/25/2022 0101   LEUKOCYTESUR Negative 11/09/2023 1620   LEUKOCYTESUR NEGATIVE 12/25/2022 0101    Lab Results  Component Value Date   LABMICR See below: 11/09/2023   WBCUA None seen 11/09/2023   LABEPIT 0-10 11/09/2023   BACTERIA None seen 11/09/2023    Pertinent Imaging:  No results found for this or any previous visit.  No  results found for this or any previous visit.  No results found for this or any previous visit.  No results found for this or any previous visit.  Results for orders placed during the hospital encounter of 08/11/23  US  RENAL  Narrative CLINICAL DATA:  Renal stones  EXAM: RENAL / URINARY TRACT ULTRASOUND COMPLETE  COMPARISON:  CT C AP January 16, 2023  FINDINGS: Right Kidney:  Renal measurements: 13.1 x 5.5 x 6.4 cm = volume: 242.3 mL. Normal renal cortical thickness and echogenicity. No renal mass. Trace pelviectasis.  Left Kidney:  Renal measurements: 13.5 x 6.8 x 5.0 cm = volume: 239.5 mL. Normal renal cortical thickness and echogenicity. No renal mass. Mild-to-moderate pelviectasis.  Bladder:  Appears normal for degree of bladder distention.  Other:  Prevoid: 569.8 cc  Postvoid: 54.1 cc  IMPRESSION: 1. Mild-to-moderate left pelviectasis. 2. Trace right pelviectasis.   Electronically Signed By: Bard Moats M.D. On: 08/12/2023 14:03  No results found for this or any previous visit.  No results found for this or any previous visit.  No results found for this or any previous visit.  Trimix injection  instruction:  Patient instructed to draw 0.68ml of trimix into the insulin  syringe. I them instructed him to inject at the mid penile shaft at either the 3 or 9 o'clock position. I then injected the patient and he achieved a good erection in 20 minutes. Penile injection completed.   Assessment & Plan:    1. Erectile dysfunction, unspecified erectile dysfunction type (Primary) The patient was instructed on proper technique for trimix injection. He is instructed to alternate side and location of the injection. He was instructed in titrating the trimix dose. He is instructed to call the office for an erection lasting more than 4 hours    No follow-ups on file.  Belvie Clara, MD  [facility] [place]

## 2024-01-05 NOTE — Patient Instructions (Signed)

## 2024-01-11 ENCOUNTER — Emergency Department
Admission: EM | Admit: 2024-01-11 | Discharge: 2024-01-11 | Disposition: A | Discharge status: Home / Self Care | Attending: Emergency Medicine | Admitting: Emergency Medicine

## 2024-01-11 ENCOUNTER — Other Ambulatory Visit: Payer: Self-pay

## 2024-01-11 DIAGNOSIS — F17220 Nicotine dependence, chewing tobacco, uncomplicated: Secondary | ICD-10-CM | POA: Insufficient documentation

## 2024-01-11 DIAGNOSIS — I10 Essential (primary) hypertension: Secondary | ICD-10-CM | POA: Diagnosis not present

## 2024-01-11 DIAGNOSIS — E109 Type 1 diabetes mellitus without complications: Secondary | ICD-10-CM | POA: Insufficient documentation

## 2024-01-11 DIAGNOSIS — N4830 Priapism, unspecified: Secondary | ICD-10-CM | POA: Diagnosis not present

## 2024-01-11 DIAGNOSIS — N4889 Other specified disorders of penis: Secondary | ICD-10-CM | POA: Diagnosis present

## 2024-01-11 MED ORDER — HYDROMORPHONE HCL 1 MG/ML IJ SOLN
1.0000 mg | INTRAMUSCULAR | Status: AC
  Administered 2024-01-11: 1 mg via INTRAVENOUS
  Filled 2024-01-11: qty 1

## 2024-01-11 MED ORDER — LIDOCAINE HCL (PF) 1 % IJ SOLN
10.0000 mL | Freq: Once | INTRAMUSCULAR | Status: AC
Start: 2024-01-11 — End: 2024-01-11
  Administered 2024-01-11: 10 mL
  Filled 2024-01-11: qty 10

## 2024-01-11 MED ORDER — HYDROMORPHONE HCL 1 MG/ML IJ SOLN
0.5000 mg | Freq: Once | INTRAMUSCULAR | Status: AC
  Administered 2024-01-11: 0.5 mg via INTRAVENOUS
  Filled 2024-01-11: qty 0.5

## 2024-01-11 MED ORDER — LIDOCAINE HCL (PF) 1 % IJ SOLN
10.0000 mL | Freq: Once | INTRAMUSCULAR | Status: AC
  Administered 2024-01-11: 10 mL
  Filled 2024-01-11: qty 10

## 2024-01-11 MED ORDER — PHENYLEPHRINE 200 MCG/ML FOR PRIAPISM / HYPOTENSION
100.0000 ug | INTRAMUSCULAR | Status: DC | PRN
  Administered 2024-01-11: 100 ug via INTRACAVERNOUS
  Filled 2024-01-11: qty 50

## 2024-01-11 NOTE — Consult Note (Addendum)
 Urology Consult   I have been asked to see the patient by Dr. Dicky, for evaluation and management of priapism.  Chief Complaint: priapism  HPI:  Barry Pittman is a 38 y.o. year old male presenting with > 12 hours present Recently saw Dr. Sherrilee 01/05/2024-Trimix education in clinic, starting dose 0.2 cc Patient injected for the first time last night, 9 PM States he injected 2 of Trimix-unclear if this was 0.2 or 2.0 mL Sustained painful erection x 12 hours, presented to ED this morning Denies any concomitant illicit drug use, PDE 5 inhibitors, nonprescribed erectogenic medications  Dr. Dicky injected 10 cc of 1% lidocaine  plain + 500 mcg of phenylephrine  prior to my arrival  - BP 220/110 on my arrival, post phenylephrine   PMH: Past Medical History:  Diagnosis Date   Diabetes mellitus without complication (HCC)    type 1   Hypertension     Surgical History: Past Surgical History:  Procedure Laterality Date   EYE SURGERY     For retinopathy     Home Medications:   Allergies:  Allergies  Allergen Reactions   Insulin  Aspart (Human Analog) (Yeast) Other (See Comments)    goes into DKA Can take Humalog  but not Novolog     Family History: Family History  Problem Relation Age of Onset   Diabetes Mother    Heart disease Mother    Diabetes Father    Heart disease Father     Social History:  reports that he has never smoked. His smokeless tobacco use includes chew. He reports current alcohol use. He reports that he does not use drugs.  ROS: Negative aside from those stated in the HPI.  Physical Exam: BP (!) 190/93   Pulse 98   Temp 98 F (36.7 C)   Resp 19   Ht 5' 11 (1.803 m)   Wt 104.3 kg   SpO2 97%   BMI 32.08 kg/m    Constitutional:  Alert and oriented, No acute distress. Cardiovascular: No clubbing, cyanosis, or edema. Respiratory: Normal respiratory effort, no increased work of breathing. GI: Abdomen is soft, nontender,  nondistended, no abdominal masses GU: On initial exam-100% rigid erection, rigid glans, exquisitely painful to palpation Lymph: No cervical or inguinal lymphadenopathy. Skin: No rashes, bruises or suspicious lesions. Neurologic: Grossly intact, no focal deficits, moving all 4 extremities. Psychiatric: Normal mood and affect.  Procedure Note Patient lying in ED bed 17 on my arrival and initial exam.  He was quite uncomfortable.  S/p initial 10 cc 1% lidocaine  superficial penile block, as well as 500 mcg phenylephrine  into cavernosa by Dr. Dicky.  He continued to have a 100% rigid erection with rigid glans, exquisitely painful to palpation.  I introduced myself and obtained informed verbal consent to take over the bedside procedure and assist with his care. Of note, BP 220/110 on my arrival, post phenylephrine - I did not re-dose.  I began with reinjection of 10 cc 1% lidocaine  plain as a deep dorsal nerve block + superficial penile ring block.  This resulted in near total anesthesia of the shaft and glans.  Next I inserted to bilateral 18-gauge Angiocath needles to the mid lateral shaft.  With manual compression there was immediate evacuation of nonviscous dark old blood.  Next, performed gentle irrigation with sterile water and continued aspiration/manual evacuation.  Ultimately rigidity reduced to 30-40% with adequate malleability.  All needles and instruments were removed.  Patient was cleaned dried and ice pack compression was administered.  ADDENDUM: Rigidity returned to ~70-80% with pain. Returned to beside for 2nd exam. At this point, BP had returned to ~160 systolic. Administered 500 mcg of ICI phenylephrine  (2nd dose) to Left lateral cavernosa. Immediate detumescence to <10% rigidity. No further aspiration required.   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  [facility] [address] [place], [place] [address] [phone]

## 2024-01-11 NOTE — ED Provider Notes (Signed)
 [facility] Provider Note    Event Date/Time   First MD Initiated Contact with Patient 01/11/24 [phone]     (approximate)   History   priapism   HPI  Barry Pittman is a 38 y.o. male with a history of erectile dysfunction and diabetes  Yesterday at 9 PM injected prescription Trimix into the penis.  He has been having a painful erection since about 10 or 11 PM last night.  It has not improved and his penis has become progressively more painful and erection has continued for about the last 10 hours  He is under the care of urologist Dr. Sherrilee in [place]..  Patient reports this has never happened before and he did a trial of about a week ago.  He did not use any other medication for erectile dysfunction      Physical Exam   Triage Vital Signs: ED Triage Vitals  Encounter Vitals Group     BP 01/11/24 0732 (!) 190/93     Girls Systolic BP Percentile --      Girls Diastolic BP Percentile --      Boys Systolic BP Percentile --      Boys Diastolic BP Percentile --      Pulse Rate 01/11/24 0732 98     Resp 01/11/24 0752 19     Temp 01/11/24 0732 98 F (36.7 C)     Temp src --      SpO2 01/11/24 0732 97 %     Weight 01/11/24 0732 230 lb (104.3 kg)     Height 01/11/24 0732 5' 11 (1.803 m)     Head Circumference --      Peak Flow --      Pain Score 01/11/24 0732 10     Pain Loc --      Pain Education --      Exclude from Growth Chart --     Most recent vital signs: Vitals:   01/11/24 1100 01/11/24 1128  BP: 136/83   Pulse: 84   Resp:    Temp:  98.1 F (36.7 C)  SpO2: 93%      General: Awake, no distress.  Appears in pain reporting that the penis is very painful CV:  Good peripheral perfusion.  Resp:  Normal effort.  Abd:  No distention.  Other:  Fully erect noncircumcised penis.  Is quite tender to touch.  It is extremely hard to touch.  There is no purpuric change.  The scrotum and testicles appear normal and are nontender.   Patient reports severe pain in the shaft of the penis   ED Results / Procedures / Treatments   Labs (all labs ordered are listed, but only abnormal results are displayed) Labs Reviewed - No data to display   EKG     RADIOLOGY     PROCEDURES:  Critical Care performed: Yes, see critical care procedure note(s)   CRITICAL CARE Performed by: Oneil Budge   Total critical care time: 35 minutes  Critical care time was exclusive of separately billable procedures and treating other patients.  Critical care was necessary to treat or prevent imminent or life-threatening deterioration.  Critical care was time spent personally by me on the following activities: development of treatment plan with patient and/or surrogate as well as nursing, discussions with consultants, evaluation of patient's response to treatment, examination of patient, obtaining history from patient or surrogate, ordering and performing treatments and interventions, ordering and review of laboratory studies, ordering  and review of radiographic studies, pulse oximetry and re-evaluation of patient's condition.   Inject corpus cavern, priapism  Date/Time: 01/11/2024 8:35 AM  Performed by: Dicky Anes, MD Authorized by: Dicky Anes, MD  Consent: Verbal consent obtained. Written consent obtained Risks and benefits: risks, benefits and alternatives were discussed Consent given by: patient Patient understanding: patient states understanding of the procedure being performed Patient consent: the patient's understanding of the procedure matches consent given Procedure consent: procedure consent matches procedure scheduled Relevant documents: relevant documents present and verified Test results: test results available and properly labeled Required items: required blood products, implants, devices, and special equipment available Patient identity confirmed: verbally with patient and arm band Time out: Immediately prior to  procedure a time out was called to verify the correct patient, procedure, equipment, support staff and site/side marked as required. Preparation: Patient was prepped and draped in the usual sterile fashion. Local anesthesia used: yes Anesthesia: nerve block (dorsal penile block)  Anesthesia: Local anesthesia used: yes Local Anesthetic: lidocaine  1% with epinephrine Anesthetic total: 4 mL  Sedation: Patient sedated: no  Patient tolerance: patient tolerated the procedure well with no immediate complications      MEDICATIONS ORDERED IN ED: Medications  phenylephrine  200 mcg / ml CONC. DILUTION INJ (ED / Urology USE ONLY) (100 mcg Intracavernosal Given 01/11/24 0826)  HYDROmorphone  (DILAUDID ) injection 1 mg (1 mg Intravenous Given 01/11/24 0812)  lidocaine  (PF) (XYLOCAINE ) 1 % injection 10 mL (10 mLs Other Given 01/11/24 0826)  lidocaine  (PF) (XYLOCAINE ) 1 % injection 10 mL (10 mLs Other Given 01/11/24 1026)  HYDROmorphone  (DILAUDID ) injection 0.5 mg (0.5 mg Intravenous Given 01/11/24 1035)     IMPRESSION / MDM / ASSESSMENT AND PLAN / ED COURSE  I reviewed the triage vital signs and the nursing notes.                              Differential diagnosis includes, but is not limited to, acute priapism secondary to use of Trimix, medication induced, etc.  He has evidence of acute priapism with symptoms of ischemia.  Immediately addressed with urology who has seen for stat consult, I did perform initial numbing and injection of phenylephrine  and remainder of procedure aspiration and further injections were performed by urologist at the bedside.  Patient's presentation is most consistent with acute presentation with potential threat to life or bodily function.      Clinical Course as of 01/11/24 1241  Thu Jan 11, 2024  0809 Dr. Georganne (urology on call) paged for stat consult request and [facility] request for stat consult sent. [MQ]  0842 500 mcg phenylephrine  injected intracavernosal on  the RIGHT. Tolderated well. Observing for 5 mins [MQ]  0857 Dr. Georganne at bedside performing irrigation and futher treatments for priapism [MQ]  0909 About 30-40 percent improvement in erection at thist ime. Urology following closely. Improvement..  No further plan for phenylephrine  at this time as patient's blood pressure elevated to approximately 240 systolic after initial phenylephrine  dose.  Continue to monitor blood pressure.  Patient asymptomatic pain well-controlled at this time [MQ]  1110 Dr. Georganne advises patient ok for discharge and follow-up with Dr. Sherrilee. Now non-erect.  [MQ]    Clinical Course User Index [MQ] Dicky Anes, MD   ----------------------------------------- 12:42 PM on 01/11/2024 ----------------------------------------- Patient has been observed, no further priapism.  Will discontinue Trimix.  Will be following up with Dr. Sherrilee, understands careful return precautions including to seek  immediate medical care if he has another painful erection, erection the last more than an hour or other new concerns arise  He is not driving himself  FINAL CLINICAL IMPRESSION(S) / ED DIAGNOSES   Final diagnoses:  Priapism     Rx / DC Orders   ED Discharge Orders          Ordered    Ambulatory referral to Urology       Comments: priapism   01/11/24 1241             Note:  This document was prepared using Dragon voice recognition software and may include unintentional dictation errors.   Dicky Anes, MD 01/11/24 [phone]

## 2024-01-11 NOTE — Discharge Instructions (Addendum)
 Discontinue Trimix.  Do not take any additional medications for erectile dysfunction at this time.  Follow-up with Dr. Sherrilee your urologist.  Return to the ER right away if you have return of erection that lasts longer than 1 hour, you have penile pain, symptoms return, or other concerns arise

## 2024-01-11 NOTE — ED Notes (Signed)
 Pt has no rigidity of the penis at this time. Post urology decompression of the penis the second time.

## 2024-01-11 NOTE — ED Triage Notes (Signed)
 Pt to ED for erection since 2100 last night, states doctor started new med for Erectile dysfuction

## 2024-01-11 NOTE — ED Notes (Signed)
 RN attempted to call group home owner again with no answer.

## 2024-01-11 NOTE — ED Notes (Signed)
 RN notified ED provider and urology of pt penis becoming about 80%-90% rigid again.

## 2024-02-02 ENCOUNTER — Ambulatory Visit: Payer: Self-pay | Admitting: Urology

## 2024-02-02 DIAGNOSIS — N4830 Priapism, unspecified: Secondary | ICD-10-CM

## 2024-02-09 ENCOUNTER — Ambulatory Visit: Admitting: Urology

## 2024-02-13 ENCOUNTER — Encounter: Payer: Self-pay | Admitting: Nurse Practitioner

## 2024-04-03 ENCOUNTER — Ambulatory Visit: Admitting: Nurse Practitioner

## 2024-04-05 ENCOUNTER — Other Ambulatory Visit: Payer: Self-pay | Admitting: Nurse Practitioner

## 2024-05-03 ENCOUNTER — Other Ambulatory Visit: Payer: Self-pay | Admitting: Nurse Practitioner

## 2024-05-08 ENCOUNTER — Other Ambulatory Visit: Payer: Self-pay | Admitting: Nurse Practitioner

## 2024-06-05 ENCOUNTER — Ambulatory Visit: Payer: Self-pay | Admitting: Urology

## 2024-06-20 ENCOUNTER — Ambulatory Visit: Admitting: Nurse Practitioner

## 2024-07-12 ENCOUNTER — Ambulatory Visit: Admitting: Urology
